# Patient Record
Sex: Female | Born: 1960
Health system: Southern US, Community
[De-identification: ages and names within clinical notes are randomized; demographics above are authoritative.]

## PROBLEM LIST (undated history)

## (undated) DIAGNOSIS — J45909 Unspecified asthma, uncomplicated: Secondary | ICD-10-CM

## (undated) DIAGNOSIS — N7011 Chronic salpingitis: Secondary | ICD-10-CM

## (undated) DIAGNOSIS — D259 Leiomyoma of uterus, unspecified: Secondary | ICD-10-CM

## (undated) DIAGNOSIS — F419 Anxiety disorder, unspecified: Secondary | ICD-10-CM

## (undated) DIAGNOSIS — K219 Gastro-esophageal reflux disease without esophagitis: Secondary | ICD-10-CM

## (undated) DIAGNOSIS — J302 Other seasonal allergic rhinitis: Secondary | ICD-10-CM

## (undated) DIAGNOSIS — N879 Dysplasia of cervix uteri, unspecified: Secondary | ICD-10-CM

## (undated) DIAGNOSIS — K635 Polyp of colon: Secondary | ICD-10-CM

## (undated) DIAGNOSIS — N870 Mild cervical dysplasia: Secondary | ICD-10-CM

## (undated) DIAGNOSIS — K589 Irritable bowel syndrome without diarrhea: Secondary | ICD-10-CM

## (undated) DIAGNOSIS — R208 Other disturbances of skin sensation: Secondary | ICD-10-CM

## (undated) DIAGNOSIS — N840 Polyp of corpus uteri: Secondary | ICD-10-CM

## (undated) DIAGNOSIS — J189 Pneumonia, unspecified organism: Secondary | ICD-10-CM

## (undated) DIAGNOSIS — I10 Essential (primary) hypertension: Secondary | ICD-10-CM

## (undated) DIAGNOSIS — E785 Hyperlipidemia, unspecified: Secondary | ICD-10-CM

## (undated) HISTORY — DX: Leiomyoma of uterus, unspecified: D25.9

## (undated) HISTORY — DX: Essential (primary) hypertension: I10

## (undated) HISTORY — PX: UPPER GASTROINTESTINAL ENDOSCOPY: SHX188

## (undated) HISTORY — DX: Polyp of colon: K63.5

## (undated) HISTORY — PX: PELVIC LAPAROSCOPY: SHX162

## (undated) HISTORY — DX: Mild cervical dysplasia: N87.0

## (undated) HISTORY — DX: Other seasonal allergic rhinitis: J30.2

## (undated) HISTORY — PX: OTHER SURGICAL HISTORY: SHX169

## (undated) HISTORY — PX: DILATION AND CURETTAGE OF UTERUS: SHX78

## (undated) HISTORY — PX: HYSTEROSCOPY: SHX211

## (undated) HISTORY — PX: LASER ABLATION OF THE CERVIX: SHX1949

## (undated) HISTORY — PX: CARPAL TUNNEL RELEASE: SHX101

## (undated) HISTORY — DX: Dysplasia of cervix uteri, unspecified: N87.9

## (undated) HISTORY — DX: Polyp of corpus uteri: N84.0

## (undated) HISTORY — DX: Anxiety disorder, unspecified: F41.9

## (undated) HISTORY — DX: Gastro-esophageal reflux disease without esophagitis: K21.9

## (undated) HISTORY — DX: Pneumonia, unspecified organism: J18.9

## (undated) HISTORY — DX: Other disturbances of skin sensation: R20.8

## (undated) HISTORY — DX: Unspecified asthma, uncomplicated: J45.909

## (undated) HISTORY — DX: Irritable bowel syndrome, unspecified: K58.9

## (undated) HISTORY — DX: Chronic salpingitis: N70.11

## (undated) HISTORY — PX: UTERINE FIBROID EMBOLIZATION: SHX825

## (undated) HISTORY — DX: Hyperlipidemia, unspecified: E78.5

---

## 1997-11-30 ENCOUNTER — Ambulatory Visit (HOSPITAL_COMMUNITY): Admission: RE | Admit: 1997-11-30 | Discharge: 1997-11-30 | Payer: Self-pay | Admitting: *Deleted

## 2000-04-12 ENCOUNTER — Encounter: Payer: Self-pay | Admitting: *Deleted

## 2000-04-12 ENCOUNTER — Ambulatory Visit (HOSPITAL_COMMUNITY): Admission: RE | Admit: 2000-04-12 | Discharge: 2000-04-12 | Payer: Self-pay | Admitting: *Deleted

## 2000-11-29 ENCOUNTER — Encounter: Payer: Self-pay | Admitting: *Deleted

## 2000-11-29 ENCOUNTER — Ambulatory Visit (HOSPITAL_COMMUNITY): Admission: RE | Admit: 2000-11-29 | Discharge: 2000-11-29 | Payer: Self-pay | Admitting: *Deleted

## 2001-01-05 ENCOUNTER — Ambulatory Visit (HOSPITAL_COMMUNITY): Admission: RE | Admit: 2001-01-05 | Discharge: 2001-01-05 | Payer: Self-pay | Admitting: *Deleted

## 2001-01-28 ENCOUNTER — Other Ambulatory Visit: Admission: RE | Admit: 2001-01-28 | Discharge: 2001-01-28 | Payer: Self-pay | Admitting: Obstetrics and Gynecology

## 2002-03-09 ENCOUNTER — Other Ambulatory Visit: Admission: RE | Admit: 2002-03-09 | Discharge: 2002-03-09 | Payer: Self-pay | Admitting: Obstetrics and Gynecology

## 2003-03-14 ENCOUNTER — Ambulatory Visit (HOSPITAL_COMMUNITY): Admission: RE | Admit: 2003-03-14 | Discharge: 2003-03-14 | Payer: Self-pay | Admitting: *Deleted

## 2003-06-27 ENCOUNTER — Other Ambulatory Visit: Admission: RE | Admit: 2003-06-27 | Discharge: 2003-06-27 | Payer: Self-pay | Admitting: Obstetrics and Gynecology

## 2003-08-14 ENCOUNTER — Ambulatory Visit (HOSPITAL_BASED_OUTPATIENT_CLINIC_OR_DEPARTMENT_OTHER): Admission: RE | Admit: 2003-08-14 | Discharge: 2003-08-14 | Payer: Self-pay | Admitting: Obstetrics and Gynecology

## 2004-07-31 ENCOUNTER — Other Ambulatory Visit: Admission: RE | Admit: 2004-07-31 | Discharge: 2004-07-31 | Payer: Self-pay | Admitting: Addiction Medicine

## 2005-02-17 ENCOUNTER — Other Ambulatory Visit: Admission: RE | Admit: 2005-02-17 | Discharge: 2005-02-17 | Payer: Self-pay | Admitting: Obstetrics and Gynecology

## 2005-02-24 ENCOUNTER — Ambulatory Visit: Payer: Self-pay | Admitting: Gastroenterology

## 2005-03-04 ENCOUNTER — Ambulatory Visit (HOSPITAL_COMMUNITY): Admission: RE | Admit: 2005-03-04 | Discharge: 2005-03-04 | Payer: Self-pay | Admitting: Obstetrics and Gynecology

## 2005-07-06 ENCOUNTER — Ambulatory Visit: Payer: Self-pay | Admitting: Gastroenterology

## 2005-09-17 ENCOUNTER — Other Ambulatory Visit: Admission: RE | Admit: 2005-09-17 | Discharge: 2005-09-17 | Payer: Self-pay | Admitting: Obstetrics and Gynecology

## 2006-11-29 ENCOUNTER — Encounter: Admission: RE | Admit: 2006-11-29 | Discharge: 2006-11-29 | Payer: Self-pay | Admitting: Internal Medicine

## 2007-02-01 ENCOUNTER — Other Ambulatory Visit: Admission: RE | Admit: 2007-02-01 | Discharge: 2007-02-01 | Payer: Self-pay | Admitting: Obstetrics and Gynecology

## 2007-06-01 ENCOUNTER — Encounter: Admission: RE | Admit: 2007-06-01 | Discharge: 2007-06-01 | Payer: Self-pay | Admitting: Internal Medicine

## 2007-08-12 ENCOUNTER — Ambulatory Visit: Payer: Self-pay | Admitting: Gastroenterology

## 2007-08-12 LAB — CONVERTED CEMR LAB
ALT: 20 units/L (ref 0–35)
AST: 24 units/L (ref 0–37)
Albumin: 3.8 g/dL (ref 3.5–5.2)
Alkaline Phosphatase: 67 units/L (ref 39–117)
BUN: 9 mg/dL (ref 6–23)
Basophils Absolute: 0.1 10*3/uL (ref 0.0–0.1)
Basophils Relative: 0.6 % (ref 0.0–1.0)
Bilirubin, Direct: 0.1 mg/dL (ref 0.0–0.3)
CO2: 29 meq/L (ref 19–32)
Calcium: 9 mg/dL (ref 8.4–10.5)
Chloride: 105 meq/L (ref 96–112)
Creatinine, Ser: 0.6 mg/dL (ref 0.4–1.2)
Eosinophils Absolute: 0.1 10*3/uL (ref 0.0–0.7)
Eosinophils Relative: 1.3 % (ref 0.0–5.0)
GFR calc Af Amer: 138 mL/min
GFR calc non Af Amer: 114 mL/min
Glucose, Bld: 99 mg/dL (ref 70–99)
HCT: 37.7 % (ref 36.0–46.0)
Hemoglobin: 12.2 g/dL (ref 12.0–15.0)
Lymphocytes Relative: 33.6 % (ref 12.0–46.0)
MCHC: 32.4 g/dL (ref 30.0–36.0)
MCV: 83.5 fL (ref 78.0–100.0)
Monocytes Absolute: 0.8 10*3/uL (ref 0.1–1.0)
Monocytes Relative: 8.8 % (ref 3.0–12.0)
Neutro Abs: 5.3 10*3/uL (ref 1.4–7.7)
Neutrophils Relative %: 55.7 % (ref 43.0–77.0)
Platelets: 316 10*3/uL (ref 150–400)
Potassium: 4 meq/L (ref 3.5–5.1)
RBC: 4.52 M/uL (ref 3.87–5.11)
RDW: 13.4 % (ref 11.5–14.6)
Sodium: 139 meq/L (ref 135–145)
TSH: 0.91 microintl units/mL (ref 0.35–5.50)
Total Bilirubin: 0.5 mg/dL (ref 0.3–1.2)
Total Protein: 7.3 g/dL (ref 6.0–8.3)
WBC: 9.5 10*3/uL (ref 4.5–10.5)

## 2007-08-29 ENCOUNTER — Ambulatory Visit: Payer: Self-pay | Admitting: Gastroenterology

## 2007-08-29 LAB — HM COLONOSCOPY

## 2007-11-21 ENCOUNTER — Encounter: Payer: Self-pay | Admitting: Obstetrics and Gynecology

## 2007-11-21 ENCOUNTER — Ambulatory Visit (HOSPITAL_BASED_OUTPATIENT_CLINIC_OR_DEPARTMENT_OTHER): Admission: RE | Admit: 2007-11-21 | Discharge: 2007-11-21 | Payer: Self-pay | Admitting: Obstetrics and Gynecology

## 2008-01-02 ENCOUNTER — Emergency Department (HOSPITAL_COMMUNITY): Admission: EM | Admit: 2008-01-02 | Discharge: 2008-01-02 | Payer: Self-pay | Admitting: Emergency Medicine

## 2008-01-26 ENCOUNTER — Ambulatory Visit: Payer: Self-pay | Admitting: Internal Medicine

## 2008-01-26 DIAGNOSIS — D259 Leiomyoma of uterus, unspecified: Secondary | ICD-10-CM | POA: Insufficient documentation

## 2008-01-26 DIAGNOSIS — K219 Gastro-esophageal reflux disease without esophagitis: Secondary | ICD-10-CM | POA: Insufficient documentation

## 2008-12-06 ENCOUNTER — Telehealth: Payer: Self-pay | Admitting: Gastroenterology

## 2008-12-10 ENCOUNTER — Telehealth: Payer: Self-pay | Admitting: Gastroenterology

## 2008-12-14 ENCOUNTER — Ambulatory Visit: Payer: Self-pay | Admitting: Internal Medicine

## 2008-12-17 ENCOUNTER — Telehealth (INDEPENDENT_AMBULATORY_CARE_PROVIDER_SITE_OTHER): Payer: Self-pay | Admitting: *Deleted

## 2008-12-25 ENCOUNTER — Other Ambulatory Visit: Admission: RE | Admit: 2008-12-25 | Discharge: 2008-12-25 | Payer: Self-pay | Admitting: Obstetrics and Gynecology

## 2008-12-25 ENCOUNTER — Encounter: Payer: Self-pay | Admitting: Obstetrics and Gynecology

## 2008-12-25 ENCOUNTER — Ambulatory Visit: Payer: Self-pay | Admitting: Obstetrics and Gynecology

## 2009-01-10 ENCOUNTER — Encounter: Payer: Self-pay | Admitting: Internal Medicine

## 2009-02-06 ENCOUNTER — Encounter: Payer: Self-pay | Admitting: Internal Medicine

## 2009-05-20 ENCOUNTER — Ambulatory Visit: Payer: Self-pay | Admitting: Obstetrics and Gynecology

## 2009-05-27 ENCOUNTER — Ambulatory Visit (HOSPITAL_COMMUNITY): Admission: RE | Admit: 2009-05-27 | Discharge: 2009-05-27 | Payer: Self-pay | Admitting: Obstetrics and Gynecology

## 2009-06-19 ENCOUNTER — Ambulatory Visit: Payer: Self-pay | Admitting: Internal Medicine

## 2009-07-17 ENCOUNTER — Ambulatory Visit: Payer: Self-pay | Admitting: Obstetrics and Gynecology

## 2009-09-24 LAB — CONVERTED CEMR LAB: Pap Smear: NORMAL

## 2009-10-02 LAB — HM MAMMOGRAPHY: HM Mammogram: NORMAL

## 2009-10-11 ENCOUNTER — Ambulatory Visit: Payer: Self-pay | Admitting: Internal Medicine

## 2009-10-11 DIAGNOSIS — M79609 Pain in unspecified limb: Secondary | ICD-10-CM | POA: Insufficient documentation

## 2009-10-11 DIAGNOSIS — B351 Tinea unguium: Secondary | ICD-10-CM | POA: Insufficient documentation

## 2009-10-15 ENCOUNTER — Ambulatory Visit: Payer: Self-pay | Admitting: Obstetrics and Gynecology

## 2009-10-17 ENCOUNTER — Ambulatory Visit: Payer: Self-pay | Admitting: Obstetrics and Gynecology

## 2009-10-23 ENCOUNTER — Ambulatory Visit: Payer: Self-pay | Admitting: Internal Medicine

## 2009-10-23 DIAGNOSIS — R21 Rash and other nonspecific skin eruption: Secondary | ICD-10-CM | POA: Insufficient documentation

## 2009-12-19 ENCOUNTER — Ambulatory Visit: Payer: Self-pay | Admitting: Internal Medicine

## 2009-12-19 DIAGNOSIS — B029 Zoster without complications: Secondary | ICD-10-CM | POA: Insufficient documentation

## 2009-12-19 LAB — CONVERTED CEMR LAB
ALT: 21 units/L (ref 0–35)
AST: 18 units/L (ref 0–37)
Albumin: 4 g/dL (ref 3.5–5.2)
Alkaline Phosphatase: 89 units/L (ref 39–117)
BUN: 9 mg/dL (ref 6–23)
Basophils Absolute: 0 10*3/uL (ref 0.0–0.1)
Basophils Relative: 0.5 % (ref 0.0–3.0)
Bilirubin, Direct: 0.1 mg/dL (ref 0.0–0.3)
CO2: 27 meq/L (ref 19–32)
Calcium: 9.3 mg/dL (ref 8.4–10.5)
Chloride: 107 meq/L (ref 96–112)
Creatinine, Ser: 0.5 mg/dL (ref 0.4–1.2)
Eosinophils Absolute: 0 10*3/uL (ref 0.0–0.7)
Eosinophils Relative: 0.6 % (ref 0.0–5.0)
GFR calc non Af Amer: 153.85 mL/min (ref >60)
Glucose, Bld: 90 mg/dL (ref 70–99)
HCT: 36.7 % (ref 36.0–46.0)
Hemoglobin: 12.5 g/dL (ref 12.0–15.0)
Lymphocytes Relative: 32.5 % (ref 12.0–46.0)
Lymphs Abs: 2.2 10*3/uL (ref 0.7–4.0)
MCHC: 33.9 g/dL (ref 30.0–36.0)
MCV: 83.5 fL (ref 78.0–100.0)
Monocytes Absolute: 0.6 10*3/uL (ref 0.1–1.0)
Monocytes Relative: 8.3 % (ref 3.0–12.0)
Neutro Abs: 3.9 10*3/uL (ref 1.4–7.7)
Neutrophils Relative %: 58.1 % (ref 43.0–77.0)
Platelets: 333 10*3/uL (ref 150.0–400.0)
Potassium: 4.3 meq/L (ref 3.5–5.1)
RBC: 4.39 M/uL (ref 3.87–5.11)
RDW: 14.6 % (ref 11.5–14.6)
Sed Rate: 35 mm/hr — ABNORMAL HIGH (ref 0–22)
Sodium: 143 meq/L (ref 135–145)
Total Bilirubin: 0.4 mg/dL (ref 0.3–1.2)
Total Protein: 7.3 g/dL (ref 6.0–8.3)
WBC: 6.8 10*3/uL (ref 4.5–10.5)

## 2009-12-20 ENCOUNTER — Telehealth: Payer: Self-pay | Admitting: Internal Medicine

## 2010-03-26 ENCOUNTER — Encounter: Payer: Self-pay | Admitting: Internal Medicine

## 2010-03-26 ENCOUNTER — Ambulatory Visit: Payer: Self-pay | Admitting: Internal Medicine

## 2010-03-26 DIAGNOSIS — R609 Edema, unspecified: Secondary | ICD-10-CM | POA: Insufficient documentation

## 2010-03-26 DIAGNOSIS — R9431 Abnormal electrocardiogram [ECG] [EKG]: Secondary | ICD-10-CM | POA: Insufficient documentation

## 2010-03-26 LAB — CONVERTED CEMR LAB
ALT: 22 units/L (ref 0–35)
AST: 27 units/L (ref 0–37)
Albumin: 4.1 g/dL (ref 3.5–5.2)
Alkaline Phosphatase: 83 units/L (ref 39–117)
BUN: 8 mg/dL (ref 6–23)
Basophils Absolute: 0.1 10*3/uL (ref 0.0–0.1)
Basophils Relative: 0.6 % (ref 0.0–3.0)
Bilirubin, Direct: 0 mg/dL (ref 0.0–0.3)
CO2: 25 meq/L (ref 19–32)
Calcium: 9.3 mg/dL (ref 8.4–10.5)
Chloride: 103 meq/L (ref 96–112)
Cholesterol: 198 mg/dL (ref 0–200)
Creatinine, Ser: 0.6 mg/dL (ref 0.4–1.2)
Eosinophils Absolute: 0 10*3/uL (ref 0.0–0.7)
Eosinophils Relative: 0.5 % (ref 0.0–5.0)
GFR calc non Af Amer: 133.52 mL/min (ref >60)
Glucose, Bld: 93 mg/dL (ref 70–99)
HCT: 37.8 % (ref 36.0–46.0)
HDL: 59.8 mg/dL (ref >39.00)
Hemoglobin: 12.7 g/dL (ref 12.0–15.0)
LDL Cholesterol: 127 mg/dL — ABNORMAL HIGH (ref 0–99)
Lymphocytes Relative: 36.1 % (ref 12.0–46.0)
Lymphs Abs: 3.2 10*3/uL (ref 0.7–4.0)
MCHC: 33.8 g/dL (ref 30.0–36.0)
MCV: 84.7 fL (ref 78.0–100.0)
Monocytes Absolute: 0.6 10*3/uL (ref 0.1–1.0)
Monocytes Relative: 7 % (ref 3.0–12.0)
Neutro Abs: 5 10*3/uL (ref 1.4–7.7)
Neutrophils Relative %: 55.8 % (ref 43.0–77.0)
Platelets: 297 10*3/uL (ref 150.0–400.0)
Potassium: 4.3 meq/L (ref 3.5–5.1)
Pro B Natriuretic peptide (BNP): 26.2 pg/mL (ref 0.0–100.0)
RBC: 4.46 M/uL (ref 3.87–5.11)
RDW: 14.6 % (ref 11.5–14.6)
Sodium: 139 meq/L (ref 135–145)
TSH: 0.87 microintl units/mL (ref 0.35–5.50)
Total Bilirubin: 0.5 mg/dL (ref 0.3–1.2)
Total CHOL/HDL Ratio: 3
Total Protein: 7.4 g/dL (ref 6.0–8.3)
Triglycerides: 56 mg/dL (ref 0.0–149.0)
VLDL: 11.2 mg/dL (ref 0.0–40.0)
WBC: 8.9 10*3/uL (ref 4.5–10.5)

## 2010-03-27 ENCOUNTER — Encounter: Payer: Self-pay | Admitting: Internal Medicine

## 2010-03-28 ENCOUNTER — Ambulatory Visit: Payer: Self-pay

## 2010-03-28 ENCOUNTER — Ambulatory Visit (HOSPITAL_COMMUNITY): Admission: RE | Admit: 2010-03-28 | Discharge: 2010-03-28 | Payer: Self-pay | Admitting: Internal Medicine

## 2010-03-28 ENCOUNTER — Ambulatory Visit: Payer: Self-pay | Admitting: Cardiology

## 2010-04-09 ENCOUNTER — Telehealth: Payer: Self-pay | Admitting: Internal Medicine

## 2010-04-17 ENCOUNTER — Ambulatory Visit: Payer: Self-pay | Admitting: Obstetrics and Gynecology

## 2010-04-17 ENCOUNTER — Other Ambulatory Visit
Admission: RE | Admit: 2010-04-17 | Discharge: 2010-04-17 | Payer: Self-pay | Source: Home / Self Care | Admitting: Obstetrics and Gynecology

## 2010-04-18 ENCOUNTER — Ambulatory Visit: Payer: Self-pay | Admitting: Internal Medicine

## 2010-05-20 ENCOUNTER — Ambulatory Visit
Admission: RE | Admit: 2010-05-20 | Discharge: 2010-05-20 | Payer: Self-pay | Source: Home / Self Care | Attending: Obstetrics and Gynecology | Admitting: Obstetrics and Gynecology

## 2010-06-10 NOTE — Progress Notes (Signed)
Summary: ?'s  Phone Note Call from Patient Call back at Home Phone [phone] Call back at Work Phone [phone] Call back at [phone] after 9 am   Summary of Call: Patient left message on triage that she would like to discuss her dx of shingles, she has a few questions.  Initial call taken by: Lucious Groves CMA,  December 20, 2009 8:35 AM  Follow-up for Phone Call        Spoke w/female at pt's home #. Pt left for beach. Advised her to have pt call office w/quesitons. Left vm on pt's cell # to call back.Marland KitchenMarland KitchenLamar Sprinkles, CMA  December 20, 2009 4:40 PM   Pt left vm:  1. How severe are her shingles? 2. Would like results of labs? Follow-up by: Lamar Sprinkles, CMA,  December 23, 2009 9:34 AM  Additional Follow-up for Phone Call Additional follow up Details #1::        1. mild 2. normal Additional Follow-up by: Etta Grandchild MD,  December 23, 2009 9:49 AM    Additional Follow-up for Phone Call Additional follow up Details #2::    Pt informed.  Follow-up by: Lamar Sprinkles, CMA,  December 23, 2009 4:12 PM

## 2010-06-10 NOTE — Assessment & Plan Note (Signed)
Summary: cold sx/cd   Vital Signs:  Patient profile:   50 year old female Height:      65 inches Weight:      211 pounds BMI:     35.24 O2 Sat:      98 % on Room air Temp:     98.1 degrees F oral Pulse rate:   88 / minute Pulse rhythm:   regular Resp:     16 per minute BP sitting:   140 / 74  (left arm) Cuff size:   large  Vitals Entered By: Rock Nephew CMA (June 19, 2009 1:10 PM)  Nutrition Counseling: Patient's BMI is greater than 25 and therefore counseled on weight management options.  O2 Flow:  Room air CC: cold, cough, congestion, URI symptoms Is Patient Diabetic? No Pain Assessment Patient in pain? no        Primary Care Provider:  Etta Grandchild MD  CC:  cold, cough, congestion, and URI symptoms.  History of Present Illness:  URI Symptoms      This is a 50 year old woman who presents with URI symptoms.  The symptoms began 5 days ago.  The severity is described as mild.  The patient reports nasal congestion, sore throat, and dry cough, but denies productive cough, earache, and sick contacts.  The patient denies fever, stiff neck, dyspnea, wheezing, rash, vomiting, diarrhea, use of an antipyretic, and response to antipyretic.  The patient denies headache, muscle aches, and severe fatigue.  The patient denies the following risk factors for Strep sinusitis: unilateral facial pain, unilateral nasal discharge, double sickening, tooth pain, tender adenopathy, and absence of cough.    Preventive Screening-Counseling & Management  Alcohol-Tobacco     Alcohol drinks/day: 0     Smoking Status: never     Passive Smoke Exposure: no  Hep-HIV-STD-Contraception     Hepatitis Risk: no risk noted     HIV Risk: no     STD Risk: no risk noted      Sexual History:  currently monogamous.        Drug Use:  never.        Blood Transfusions:  no.    Medications Prior to Update: 1)  Famotidine 40 Mg Tabs (Famotidine) .... One By Mouth Once Daily For  Heartburn  Allergies (verified): 1)  ! Jonne Ply  Past History:  Past Medical History: Reviewed history from 01/26/2008 and no changes required. GERD uterine fibroids  Past Surgical History: Reviewed history from 01/26/2008 and no changes required. uterine fibroid embolectomy one month ago  Family History: Reviewed history from 01/26/2008 and no changes required. Family History of Arthritis Family History Diabetes 1st degree relative Family History High cholesterol Family History Hypertension  Social History: Reviewed history from 01/26/2008 and no changes required. 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    Physical Exam  General:  alert, well-developed, well-nourished, well-hydrated, normal appearance, cooperative to examination, and overweight-appearing.   Ears:  R ear normal and L ear normal.   Nose:  no mucosal edema, no airflow obstruction, no intranasal foreign body, no nasal polyps, no nasal mucosal  lesions, no mucosal friability, no active bleeding or clots, and nasal dischargemucosal pallor.   Mouth:  Oral mucosa and oropharynx without lesions or exudates.  Teeth in good repair. Neck:  No deformities, masses, or tenderness noted. Lungs:  Normal respiratory effort, chest expands symmetrically. Lungs are clear to auscultation, no crackles or wheezes. Heart:  Normal rate and regular rhythm. S1 and S2 normal without gallop, murmur, click, rub or other extra sounds. Abdomen:  Bowel sounds positive,abdomen soft and non-tender without  masses, organomegaly or hernias noted. Msk:  No deformity or scoliosis noted of thoracic or lumbar spine.   Pulses:  R and L carotid,radial,femoral,dorsalis pedis and posterior tibial pulses are full and equal bilaterally Extremities:  No clubbing, cyanosis, edema, or deformity noted with normal full range of motion of all joints.   Skin:  Intact without suspicious lesions or rashes Cervical Nodes:  No lymphadenopathy noted Axillary Nodes:  No palpable lymphadenopathy Psych:  Cognition and judgment appear intact. Alert and cooperative with normal attention span and concentration. No apparent delusions, illusions, hallucinations   Impression & Recommendations:  Problem # 1:  BRONCHITIS-ACUTE (ICD-466.0) Assessment New  Her updated medication list for this problem includes:    Zithromax Tri-pak 500 Mg Tab (Azithromycin) .Marland Kitchen... Take one by mouth once daily for 3 days    Mytussin Ac 100-10 Mg/62ml Syrp (Guaifenesin-codeine) .Marland Kitchen... 5-10 ml by mouth qid as needed for cough  Take antibiotics and other medications as directed. Encouraged to push clear liquids, get enough rest, and take acetaminophen as needed. To be seen in 5-7 days if no improvement, sooner if worse.  Complete Medication List: 1)  Famotidine 40 Mg Tabs (Famotidine) .... One by mouth once daily for heartburn 2)  Zithromax Tri-pak 500 Mg Tab (Azithromycin) .... Take one by mouth once daily for 3 days 3)  Mytussin Ac 100-10 Mg/60ml Syrp (Guaifenesin-codeine) .... 5-10 ml by mouth qid as needed for cough  Patient Instructions: 1)  Please schedule a follow-up appointment in 2 weeks. 2)  Take your antibiotic as prescribed until ALL of it is gone, but stop if you develop a rash or swelling and contact our office as soon as possible. 3)  Acute bronchitis symptoms for less than 10 days are not helped by antibiotics. take over the counter cough medications. call if no improvment in  5-7 days, sooner if increasing cough, fever, or new  symptoms( shortness of breath, chest pain). Prescriptions: MYTUSSIN AC 100-10 MG/5ML SYRP (GUAIFENESIN-CODEINE) 5-10 ml by mouth QID as needed for cough  #6 ounces x 0   Entered and Authorized by:   Etta Grandchild MD   Signed by:   Etta Grandchild MD on 06/19/2009   Method used:   Print then Give to Patient   RxID:   [id number] ZITHROMAX TRI-PAK 500 MG TAB (AZITHROMYCIN) Take one by mouth once daily for 3 days  #3 x 0   Entered and Authorized by:   Etta Grandchild MD   Signed by:   Etta Grandchild MD on 06/19/2009   Method used:   Print then Give to Patient   RxID:   [id number]

## 2010-06-10 NOTE — Assessment & Plan Note (Signed)
Summary: NEW TO ESTABLISH/SCRATCHY THROAT,HEADACHE/$50/CD   Vital Signs:  Patient Profile:   50 Years Old Female LMP:     01/07/2008 Temp:     97.2 degrees F oral Pulse rate:   60 / minute Pulse rhythm:   regular BP sitting:   122 / 84  (left arm) Cuff size:   regular  Vitals Entered By: Rock Nephew CMA (January 26, 2008 10:51 AM)  Menstrual History: LMP (date): 01/07/2008 LMP - Character: heavy                 Chief Complaint:  headache, cough, bodyache, and exposed to flu.  History of Present Illness: this is a new patient complaining of a 2 day history of headache, nonproductive cough, left earache, but no fever or chills. She has had myalgias but no rash or lymphadenopathy. She is taking Tylenol cough and cold but says it's not controlling her symptoms.  Today she is asking for a prescription for Zantac. She is currently taking the over-the-counter dosage of 75 mg twice a day but she states it's getting expensive and she wants to increase to Zantac once a day. She states her as her reflux symptoms are well controlled  Acute Visit History:      The patient complains of cough, earache, and musculoskeletal symptoms.  She denies abdominal pain, chest pain, constipation, fever, nasal discharge, nausea, rash, sinus problems, sore throat, and vomiting.        The character of the cough is described as nonproductive.  She has no history of COPD.  There is no history of wheezing, sleep interference, shortness of breath, respiratory retractions, tachypnea, cyanosis, or interference with oral intake associated with her cough.        The patient has a prior history of GERD.  The patient has no history of peptic ulcer disease.        Her last menstrual period was 01/07/2008.         Dyspepsia History:      The patient notes that the symptoms began approximately 12/16/2006.  There is a prior history of GERD.  She notes that she has never had an upper endoscopy or GI consultation.   The patient does not have a prior history of documented ulcer disease.  The dominant symptom is heartburn or acid reflux.  An H-2 blocker medication is currently being taken.  She notes that the symptoms have improved with the H-2 blocker therapy.  Symptoms have persisted after 4 weeks of H-2 blocker treatment.  She has no history of a positive H. Pylori serology.  No previous upper endoscopy has been done.        Prior Medication List:  No prior medications documented  Updated Prior Medication List: zantac otc 75mg  bid Current Allergies: ! 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       Divorced       Never Smoked  Alcohol use-no       Drug use-no       Regular exercise-yes   Risk Factors:  Tobacco use:  never Passive smoke exposure:  no Drug use:  no HIV high-risk behavior:  no Alcohol use:  no Exercise:  yes  Family History Risk Factors:    Family History of MI in females < 41 years old:  no   Review of Systems  The patient denies anorexia, weight loss, hoarseness, chest pain, syncope, dyspnea on exertion, peripheral edema, prolonged cough, headaches, hemoptysis, abdominal pain, melena, hematochezia, severe indigestion/heartburn, hematuria, depression, unusual weight change, abnormal bleeding, and enlarged lymph nodes.     Physical Exam  General:     alert, well-developed, well-nourished, well-hydrated, appropriate dress, normal appearance, healthy-appearing, and cooperative to examination.   Ears:     External ear exam shows no  significant lesions or deformities.  Otoscopic examination reveals clear canals, tympanic membranes are intact bilaterally without bulging, retraction, inflammation or discharge. Hearing is grossly normal bilaterally. Nose:     External nasal examination shows no deformity or inflammation. Nasal mucosa are pink and moist without lesions or exudates. Mouth:     Oral mucosa and oropharynx without lesions or exudates.  Teeth in good repair. Neck:     No deformities, masses, or tenderness noted. Lungs:     Normal respiratory effort, chest expands symmetrically. Lungs are clear to auscultation, no crackles or wheezes. Heart:     Normal rate and regular rhythm. S1 and S2 normal without gallop, murmur, click, rub or other extra sounds. Abdomen:     soft, non-tender, normal bowel sounds, no hepatomegaly, and no splenomegaly.   Skin:     turgor normal, color normal, no rashes, and no edema.   Cervical Nodes:     no anterior cervical adenopathy and no posterior cervical adenopathy.   Psych:     Oriented X3, memory intact for recent and remote, normally interactive, good eye contact, not anxious appearing, not depressed appearing, and not agitated.      Impression & Recommendations:  Problem # 1:  GERD (ICD-530.81) Assessment: Improved increase Zantac to 300 mg once a day Her updated medication list for this problem includes:    Ranitidine Hcl 300 Mg Caps (Ranitidine hcl) ..... Once daily   Problem # 2:  FIBROIDS, UTERUS (ICD-218.9) Assessment: Improved  Problem # 3:  URI (ICD-465.9) Assessment: New infection appears to be in part viral in nature and at this time and only recommended symptomatic relief and observation  Complete Medication List: 1)  Ranitidine Hcl 300 Mg Caps (Ranitidine hcl) .... Once daily  Other Orders: Flu Vaccine 56yrs + (16109) Admin 1st Vaccine (60454)  Dyspepsia Assessment/Plan:  Step Therapy: GERD Treatment Protocols:    Step-1: started    H-2 blocker  chosen: Ranitidine 150mg  by mouth at bedtime   Patient Instructions: 1)  Please schedule a follow-up appointment in 1 month. 2)  Avoid foods high in acid (tomatoes, citrus juices, spicy foods). Avoid eating within two hours of lying down or before exercising. Do not over eat; try smaller more frequent meals. Elevate head of bed twelve inches when sleeping. 3)  It is important that you exercise regularly at least 20 minutes 5 times a week. If you develop chest pain, have severe difficulty breathing, or feel very tired , stop exercising immediately and seek medical attention. 4)  You need to lose weight. Consider a lower calorie diet and regular exercise.  5)  Get plenty of  rest, drink lots of clear liquids, and use Tylenol or Ibuprofen for fever and comfort. Return in 7-10 days if you're not better:sooner if you're feeling worse.   Prescriptions: RANITIDINE HCL 300 MG CAPS (RANITIDINE HCL) once daily  #30 x 9   Entered and Authorized by:   Etta Grandchild MD   Signed by:   Etta Grandchild MD on 01/26/2008   Method used:   Print then Give to Patient   RxID:   [id number]  ]  Influenza Vaccine    Vaccine Type: Fluvax 3+    Site: left deltoid    Mfr: GlaxoSmithKline    Dose: 0.5 ml    Route: IM    Given by: Rock Nephew CMA    Exp. Date: 11/07/2008    Lot #: [id number]    VIS given: 12/02/06 version given January 26, 2008.  Flu Vaccine Consent Questions    Do you have a history of severe allergic reactions to this vaccine? no    Any prior history of allergic reactions to egg and/or gelatin? no    Do you have a sensitivity to the preservative Thimersol? no    Do you have a past history of Guillan-Barre Syndrome? no    Do you currently have an acute febrile illness? no    Have you ever had a severe reaction to latex? no    Vaccine information given and explained to patient? yes    Are you currently pregnant? no   Appended Document: NEW TO ESTABLISH/SCRATCHY  THROAT,HEADACHE/$50/CD As billing provider, I have reviewed this document.

## 2010-06-10 NOTE — Procedures (Signed)
Summary: Colon   Colonoscopy  Procedure date:  08/29/2007  Findings:      Location:  [facility].   Patient Name: Betty Bell, Betty Bell MRN: [id number] Procedure Procedures: Colonoscopy CPT: 54098.  Personnel: Endoscopist: Rachael Fee, MD.  Exam Location: Exam performed in Endoscopy Suite. Outpatient  Patient Consent: Procedure, Alternatives, Risks and Benefits discussed, consent obtained, from patient. Consent was obtained by the RN. Consent to be contacted was given.  Indications Symptoms: Constipation Abdominal pain / bloating.  Increased Risk Screening: For family history of colorectal neoplasia, in  parent  Comments: father had colon cancer. History  Current Medications: Patient is not currently taking Coumadin.  Allergies: No known allergies.  Comments: Patient history reviewed and updated, pre-procedure physical performed prior to initiation of sedation? yes Pre-Exam Physical: Performed Aug 29, 2007. Cardio-pulmonary exam, Abdominal exam, Mental status exam WNL.  Exam Exam: Extent of exam reached: Cecum, extent intended: Cecum.  The cecum was identified by appendiceal orifice and IC valve. Patient position: on left side. Time to Cecum: 00:03. Time for Withdrawl: 00:07:52. Colon retroflexion performed. Images taken. ASA Classification: II. Tolerance: good.  Monitoring: Pulse and BP monitoring, Oximetry used. Supplemental O2 given.  Colon Prep Prep results: good.  Sedation Meds: Patient assessed and found to be appropriate for moderate (conscious) sedation. Fentanyl 100 mcg. given IV. Versed 10 mg. given IV.  Findings - NORMAL EXAM: Cecum to Rectum.   Assessment Normal examination.  Comments: NORMAL EXAMINATION; NO COLON POLYPS OR CANCERS.  GIVEN HER FAMILY HISTORY OF COLON CANCER (FATHER), SHE WILL NEED A REPEAT COLONOSCOPY IN 5 YEARS.  HER MILD CONSTIPATION AND ABDOMINAL PAINS HAVE IMPROVED SINCE SHE STARTED DIALY F[employer] SUPPLEMENTS.  SHE WILL CONTINUE THISE INDEFINITELY.  OF NOTE, RECENT CBC, CMET, TSH WERE ALL NORMAL. Events  Unplanned Interventions: No intervention was required.  Unplanned Events: There were no complications. Plans Comments: COLONOSCOPY IN 5 YEARS.   This report was created from the original endoscopy report, which was reviewed and signed by the above listed endoscopist.

## 2010-06-10 NOTE — Assessment & Plan Note (Signed)
Summary: KNOT ON A STIFF NECK--STC   Vital Signs:  Patient profile:   50 year old female Height:      65 inches Weight:      205 pounds BMI:     34.24 O2 Sat:      97 % on Room air Temp:     97.5 degrees F oral Pulse rate:   84 / minute Pulse rhythm:   regular Resp:     16 per minute BP sitting:   130 / 80  (left arm) Cuff size:   large  Vitals Entered By: Rock Nephew CMA (December 19, 2009 10:38 AM)  Nutrition Counseling: Patient's BMI is greater than 25 and therefore counseled on weight management options.  O2 Flow:  Room air CC: Patient c/o knot under  Is Patient Diabetic? No   Primary Care Provider:  Etta Grandchild MD  CC:  Patient c/o knot under .  History of Present Illness: She returns c/o a painful lymph node under her right ear and a painful red rash on the right side of her face for 2 days. The rash that she had previously has resolved.  Preventive Screening-Counseling & Management  Alcohol-Tobacco     Alcohol drinks/day: 0     Smoking Status: never     Passive Smoke Exposure: no  Hep-HIV-STD-Contraception     Hepatitis Risk: no risk noted     HIV Risk: no     STD Risk: no risk noted      Sexual History:  currently monogamous.        Drug Use:  never.        Blood Transfusions:  no.    Medications Prior to Update: 1)  Famotidine 40 Mg Tabs (Famotidine) .... One By Mouth Once Daily For Heartburn 2)  Penlac 8 % Soln (Ciclopirox) .... Use Once Daily On Affected Nail(S). Once A Week Remove The Build-Up With An Alcohol Swab  Current Medications (verified): 1)  Famotidine 40 Mg Tabs (Famotidine) .... One By Mouth Once Daily For Heartburn 2)  Penlac 8 % Soln (Ciclopirox) .... Use Once Daily On Affected Nail(S). Once A Week Remove The Build-Up With An Alcohol Swab 3)  Acyclovir 800 Mg Tabs (Acyclovir) .... One By Mouth Three Times A Day For 10 Days 4)  Percocet 7.5-500 Mg Tabs (Oxycodone-Acetaminophen) .... One By Mouth Qid As Needed For  Pain  Allergies (verified): 1)  ! 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 Family History of Arthritis Family History Diabetes 1st degree relative Family History High cholesterol Family History Hypertension  Social History: Reviewed history from 01/26/2008 and no changes required. 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 Derm:  Complains of itching, lesion(s), and rash; denies changes in color of skin,  dryness, flushing, hair loss, insect bite(s), and poor wound healing.  Physical Exam  General:  alert, well-developed, well-nourished, well-hydrated, appropriate dress, normal appearance, healthy-appearing, and overweight-appearing.   Head:  on her right malar surface there is a distinct erythematous rash with mild induration that measures 3.5 cm. The edges are very well demarcated and there is no streaking, pustules, fluctuance, exudate, vesicles, or skin breakdown. Eyes:  vision grossly intact, pupils equal, and no injection.   Ears:  R  ear normal and L ear normal.   Nose:  External nasal examination shows no deformity or inflammation. Nasal mucosa are pink and moist without lesions or exudates. Mouth:  Oral mucosa and oropharynx without lesions or exudates.  Teeth in good repair. Neck:  supple, full ROM, no masses, no thyromegaly, no JVD, normal carotid upstroke, no carotid bruits, and no cervical lymphadenopathy.   Lungs:  Normal respiratory effort, chest expands symmetrically. Lungs are clear to auscultation, no crackles or wheezes. Heart:  Normal rate and regular rhythm. S1 and S2 normal without gallop, murmur, click, rub or other extra sounds. Abdomen:  Bowel sounds positive,abdomen soft and non-tender without masses, organomegaly or hernias noted. Msk:  normal ROM, no joint tenderness, no joint swelling, no joint warmth, no redness over joints, no joint deformities, no joint instability, and no crepitation.   Pulses:  R and L carotid,radial,femoral,dorsalis pedis and posterior tibial pulses are full and equal bilaterally Extremities:  No clubbing, cyanosis, edema, or deformity noted with normal full range of motion of all joints.   Neurologic:  No cranial nerve deficits noted. Station and gait are normal. Plantar reflexes are down-going bilaterally. DTRs are symmetrical throughout. Sensory, motor and coordinative functions appear intact. Skin:  turgor normal, color normal, no rashes, no suspicious lesions, no ecchymoses, no petechiae, no purpura, no ulcerations, and no edema.   Cervical Nodes:  She has a palpable lymph node in the right infra-auricular area that measures about 1.5 cm. there is no other LAD. Axillary Nodes:  no R axillary adenopathy and no L axillary adenopathy.   Inguinal Nodes:  no R inguinal adenopathy and no L inguinal adenopathy.   Psych:  Cognition and judgment appear intact. Alert and cooperative with normal attention span and concentration. No apparent delusions, illusions, hallucinations   Impression  & Recommendations:  Problem # 1:  HERPES ZOSTER, SCALP, RIGHT (ICD-053.9) Assessment New start Acyclovir, take percocet for pain  Problem # 2:  SKIN RASH (ICD-782.1) Assessment: Improved  Her updated medication list for this problem includes:    Penlac 8 % Soln (Ciclopirox) ..... Use once daily on affected nail(s). once a week remove the build-up with an alcohol swab  Orders: Venipuncture (13086) TLB-BMP (Basic Metabolic Panel-BMET) (80048-METABOL) TLB-Hepatic/Liver Function Pnl (80076-HEPATIC) TLB-CBC Platelet - w/Differential (85025-CBCD) TLB-Sedimentation Rate (ESR) (85652-ESR)  Problem # 3:  ONYCHOMYCOSIS (ICD-110.1) Assessment: Unchanged  Her updated medication list for this problem includes:    Penlac 8 % Soln (Ciclopirox) ..... Use once daily on affected nail(s). once a week remove the build-up with an alcohol swab  Orders: Venipuncture (57846) TLB-BMP (Basic Metabolic Panel-BMET) (80048-METABOL) TLB-Hepatic/Liver Function Pnl (80076-HEPATIC) TLB-CBC Platelet - w/Differential (85025-CBCD) TLB-Sedimentation Rate (ESR) (85652-ESR)  Discussed nail care and medication treatment options.   Complete Medication List: 1)  Famotidine 40 Mg Tabs (Famotidine) .... One by mouth once daily for heartburn 2)  Penlac 8 % Soln (Ciclopirox) .... Use once daily on affected nail(s). once a week remove the build-up with an alcohol swab 3)  Acyclovir 800  Mg Tabs (Acyclovir) .... One by mouth three times a day for 10 days 4)  Percocet 7.5-500 Mg Tabs (Oxycodone-acetaminophen) .... One by mouth qid as needed for pain  Patient Instructions: 1)  Please schedule a follow-up appointment in 2 weeks. 2)  Take your antibiotic as prescribed until ALL of it is gone, but stop if you develop a rash or swelling and contact our office as soon as possible. Prescriptions: PERCOCET 7.5-500 MG TABS (OXYCODONE-ACETAMINOPHEN) One by mouth QID as needed for pain  #35 x 0   Entered and Authorized by:   Etta Grandchild MD   Signed by:   Etta Grandchild MD on 12/19/2009   Method used:   Print then Give to Patient   RxID:   [id number] ACYCLOVIR 800 MG TABS (ACYCLOVIR) One by mouth three times a day for 10 days  #30 x 1   Entered and Authorized by:   Etta Grandchild MD   Signed by:   Etta Grandchild MD on 12/19/2009   Method used:   Electronically to        [facility] [phone]* (retail)       [address]       [place], [place]  [address]       Ph: [phone] or [phone]       Fax: [phone]   RxID:   [id number]

## 2010-06-10 NOTE — Procedures (Signed)
Summary: EGDwAntral bxs/[facility]  EGDwAntral bxs/[facility]   Imported By: Lester Froid 02/13/2009 10:36:35  _____________________________________________________________________  External Attachment:    Type:   Image     Comment:   External Document

## 2010-06-10 NOTE — Letter (Signed)
Summary: Lipid Letter  [facility]  [address] [place], [place] [address]   Phone: [phone]  Fax: [phone]    03/27/2010  Betty Bell [address] [place], [place]  [address]  Dear Betty Bell:  We have carefully reviewed your last lipid profile from 03/26/2010 and the results are noted below with a summary of recommendations for lipid management.    Cholesterol:       198     Goal: <200   HDL "good" Cholesterol:   75.64     Goal: >50   LDL "bad" Cholesterol:   127     Goal: <130   Triglycerides:       56.0     Goal: <150    other labs look great    TLC Diet (Therapeutic Lifestyle Change): Saturated Fats & Transfatty acids should be kept < 7% of total calories ***Reduce Saturated Fats Polyunstaurated Fat can be up to 10% of total calories Monounsaturated Fat Fat can be up to 20% of total calories Total Fat should be no greater than 25-35% of total calories Carbohydrates should be 50-60% of total calories Protein should be approximately 15% of total calories Fiber should be at least 20-30 grams a day ***Increased fiber may help lower LDL Total Cholesterol should be < 200mg /day Consider adding plant stanol/sterols to diet (example: Benacol spread) ***A higher intake of unsaturated fat may reduce Triglycerides and Increase HDL    Adjunctive Measures (may lower LIPIDS and reduce risk of Heart Attack) include: Aerobic Exercise (20-30 minutes 3-4 times a week) Limit Alcohol Consumption Weight Reduction Aspirin 75-81 mg a day by mouth (if not allergic or contraindicated) Dietary Fiber 20-30 grams a day by mouth     Current Medications: 1)    Zantac 150 Mg Tabs (Ranitidine hcl) 2)    Penlac 8 % Soln (Ciclopirox) .... Use once daily on affected nail(s). once a week remove the build-up with an alcohol swab 3)    Percocet 7.5-500 Mg Tabs (Oxycodone-acetaminophen) .... One by mouth qid as needed for pain  If you have any questions, please call. We  appreciate being able to work with you.   Sincerely,    [facility] Etta Grandchild MD

## 2010-06-10 NOTE — Assessment & Plan Note (Signed)
Summary: SWELLING,REDNESS,BURNING/MOSQUITO BITE-DR TLJ PT-OYU   Vital Signs:  Patient profile:   50 year old female Height:      65 inches Weight:      210.75 pounds BMI:     35.20 O2 Sat:      98 % on Room air Temp:     97.3 degrees F oral Pulse rate:   76 / minute BP sitting:   146 / 92  (left arm) Cuff size:   regular  Vitals Entered By: Lucious Groves (October 11, 2009 3:31 PM)  O2 Flow:  Room air  Procedure Note  Incision & Drainage: The patient complains of pain and inflammation. Onset of lesion: 4 days Indication: infected lesion  Procedure # 1: I & D    Size (in cm): 1.0 x 0.9    Region: posterior/lat    Location: L prox shin    Comment: Risks including but not limited by incomplete procedure, bleeding, infection, recurrence were discussed with the patient. Consent form was signed.  Skin prepped. Boil necrotic skin excised. Pus drained, wound cleaned and dressed. Tolerated well. Complicatons - none.     Instrument used: #10 blade    Anesthesia: none  Cleaned and prepped with: alcohol and betadine Wound dressing: neosporin and bandaid Instructions: daily dressing changes  CC: C/O mosquito bite that resembles an abscess--red, swollen, and hot to the touch./kb Is Patient Diabetic? No Pain Assessment Patient in pain? no        Primary Care Provider:  Etta Grandchild MD  CC:  C/O mosquito bite that resembles an abscess--red, swollen, and and hot to the touch./kb.  History of Present Illness: C/o L shin lesion x 2 d - painful. Does not recall a bite, injury etc... C/o R big toenail is discolored x 12 months   Current Medications (verified): 1)  Famotidine 40 Mg Tabs (Famotidine) .... One By Mouth Once Daily For Heartburn 2)  Mytussin Ac 100-10 Mg/26ml Syrp (Guaifenesin-Codeine) .... 5-10 Ml By Mouth Qid As Needed For Cough  Allergies (verified): 1)  ! 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   Mouth:  Oral mucosa and oropharynx without lesions or exudates.  Teeth in good repair. Lungs:  Normal respiratory effort, chest expands symmetrically. Lungs are clear to auscultation, no crackles or wheezes. Heart:  Normal rate and regular rhythm. S1 and S2 normal without gallop, murmur, click, rub or other extra sounds. Msk:  No deformity or scoliosis noted of thoracic or lumbar spine.   Skin:  L posterolat shin abscess 1 cm with a 12x9 cm of erythema w/sharp borders, painful R big toe toenail is w/onycho Inguinal Nodes:  No significant adenopathy   Impression & Recommendations:  Problem # 1:  ABSCESS, LEG (ICD-682.6) L/ cellulitis Assessment New  Her updated medication list for this problem includes:    Doxycycline Hyclate 100 Mg Caps (Doxycycline hyclate) .Marland Kitchen... 1 by mouth two times a day with a glass of water  Orders: I&D Abscess, Simple / Single (10060) T-Culture, Wound (87070/87205-70190)  Problem # 2:  LEG PAIN (ICD-729.5) due to #1 Assessment: New  Problem # 3:  ONYCHOMYCOSIS (ICD-110.1) R big toe Assessment: New  Her updated medication list for this problem includes:  Penlac 8 % Soln (Ciclopirox) ..... Use once daily on affected nail(s). once a week remove the build-up with an alcohol swab  Complete Medication List: 1)  Famotidine 40 Mg Tabs (Famotidine) .... One by mouth once daily for heartburn 2)  Mytussin Ac 100-10 Mg/11ml Syrp (Guaifenesin-codeine) .... 5-10 ml by mouth qid as needed for cough 3)  Doxycycline Hyclate 100 Mg Caps (Doxycycline hyclate) .Marland Kitchen.. 1 by mouth two times a day with a glass of water 4)  Mupirocin 2 % Oint (Mupirocin) .... Use two  times a day w/dressing change 5)  Penlac 8 % Soln (Ciclopirox) .... Use once daily on affected nail(s). once a week remove the build-up with an alcohol swab  Patient Instructions: 1)  Call if you are not better in a reasonable amount of time or if worse.  Prescriptions: PENLAC 8 % SOLN (CICLOPIROX) Use once daily on affected nail(s). Once a week remove the build-up with an alcohol swab  #1 x 2   Entered and Authorized by:   Tresa Garter MD   Signed by:   Tresa Garter MD on 10/11/2009   Method used:   Print then Give to Patient   RxID:   [id number] MUPIROCIN 2 % OINT (MUPIROCIN) use two times a day w/dressing change  #30 g x 0   Entered and Authorized by:   Tresa Garter MD   Signed by:   Tresa Garter MD on 10/11/2009   Method used:   Print then Give to Patient   RxID:   [id number] DOXYCYCLINE HYCLATE 100 MG CAPS (DOXYCYCLINE HYCLATE) 1 by mouth two times a day with a glass of water  #20 x 0   Entered and Authorized by:   Tresa Garter MD   Signed by:   Tresa Garter MD on 10/11/2009   Method used:   Print then Give to Patient   RxID:   [id number] PENLAC 8 % SOLN (CICLOPIROX) Use once daily on affected nail(s). Once a week remove the build-up with an alcohol swab  #1 x 2   Entered and Authorized by:   Tresa Garter MD   Signed by:   Tresa Garter MD on 10/11/2009   Method used:   [facility]        [facility] [phone]* (retail)       [address], [place]  [address]       Ph: [phone] or [phone]       Fax: [phone]   RxID:   [id number] MUPIROCIN 2 % OINT (MUPIROCIN) use two times a day w/dressing change  #30 g x 0   Entered and Authorized by:   Tresa Garter MD   Signed by:   Tresa Garter MD on 10/11/2009   Method used:   [facility] to        [facility] [phone]* (retail)       [address], [place]  [address]       Ph: [phone] or  [phone]       Fax: [phone]   RxID:   [id number] DOXYCYCLINE HYCLATE 100 MG CAPS (DOXYCYCLINE HYCLATE) 1 by mouth two times a day with a glass of water  #20 x 0   Entered and Authorized by:   Tresa Garter MD   Signed by:   Tresa Garter MD on  10/11/2009   Method used:   [facility]        [facility] [phone]* (retail)       [address]       [place], [place]  [address]       Ph: [phone] or [phone]       Fax: [phone]   RxID:   [id number]

## 2010-06-10 NOTE — Progress Notes (Signed)
Summary: RESULTS  Phone Note Call from Patient Call back at Home Phone [phone]   Caller: Patient Call For: Etta Grandchild MD Summary of Call: PT requests results of Echo done 11/18. Please advise. Initial call taken by: Verdell Face,  April 09, 2010 9:38 AM  Follow-up for Phone Call        normal Follow-up by: Etta Grandchild MD,  April 09, 2010 12:42 PM  Additional Follow-up for Phone Call Additional follow up Details #1::        pt states she still has the swelling in her legs and she wants to know what the next step will be Additional Follow-up by: Ami Bullins CMA,  April 09, 2010 3:37 PM    Additional Follow-up for Phone Call Additional follow up Details #2::    a f/up visit with me Follow-up by: Etta Grandchild MD,  April 09, 2010 3:57 PM  Additional Follow-up for Phone Call Additional follow up Details #3:: Details for Additional Follow-up Action Taken: Pt informed, transferred to scheduler for office visit.  Additional Follow-up by: Lamar Sprinkles, CMA,  April 09, 2010 4:17 PM

## 2010-06-10 NOTE — Assessment & Plan Note (Signed)
Summary: cpx-lb   Vital Signs:  Patient profile:   50 year old female Menstrual status:  postmenopausal Height:      65 inches Weight:      211.50 pounds BMI:     35.32 O2 Sat:      96 % on Room air Temp:     98.0 degrees F oral Pulse rate:   80 / minute Pulse rhythm:   regular Resp:     16 per minute BP sitting:   120 / 82  (left arm) Cuff size:   large  Vitals Entered By: Rock Nephew CMA (March 26, 2010 4:06 PM)  Nutrition Counseling: Patient's BMI is greater than 25 and therefore counseled on weight management options.  O2 Flow:  Room air  Primary Care Provider:  Etta Grandchild MD   History of Present Illness: She returns and requests a complete physical but she also complains of worsening leg and ankle edema for the last 1-2 weeks.  Dyspepsia History:      She has no alarm features of dyspepsia including no history of melena, hematochezia, dysphagia, persistent vomiting, or involuntary weight loss > 5%.  There is a prior history of GERD.  The patient does not have a prior history of documented ulcer disease.  The dominant symptom is heartburn or acid reflux.  An H-2 blocker medication is currently being taken.  She notes that the symptoms have improved with the H-2 blocker therapy.  Symptoms have not persisted after 4 weeks of H-2 blocker treatment.     Preventive Screening-Counseling & Management  Alcohol-Tobacco     Alcohol drinks/day: 0     Alcohol Counseling: not indicated; patient does not drink     Smoking Status: never     Passive Smoke Exposure: no     Tobacco Counseling: not indicated; no tobacco use  Hep-HIV-STD-Contraception     Hepatitis Risk: no risk noted     HIV Risk: no     STD Risk: no risk noted     Dental Visit-last 6 months yes     Dental Care Counseling: to seek dental care; no dental care within six months     SBE monthly: no     Sun Exposure-Excessive: no  Safety-Violence-Falls     Seat Belt Use: yes     Helmet Use: n/a  Firearms in the Home: no firearms in the home     Smoke Detectors: yes     Violence in the Home: no risk noted      Sexual History:  currently monogamous.        Drug Use:  never.        Blood Transfusions:  no.    Clinical Review Panels:  Prevention   Last Mammogram:  Normal Bilateral (10/02/2009)   Last Pap Smear:  Normal (09/24/2009)   Last Colonoscopy:  Location:  [facility].  (08/29/2007)  Immunizations   Last Flu Vaccine:  Fluvax 3+ (01/26/2008)  Diabetes Management   Creatinine:  0.5 (12/19/2009)   Last Flu Vaccine:  Fluvax 3+ (01/26/2008)  CBC   WBC:  6.8 (12/19/2009)   RBC:  4.39 (12/19/2009)   Hgb:  12.5 (12/19/2009)   Hct:  36.7 (12/19/2009)   Platelets:  333.0 (12/19/2009)   MCV  83.5 (12/19/2009)   MCHC  33.9 (12/19/2009)   RDW  14.6 (12/19/2009)   PMN:  58.1 (12/19/2009)   Lymphs:  32.5 (12/19/2009)   Monos:  8.3 (12/19/2009)   Eosinophils:  0.6 (12/19/2009)  Basophil:  0.5 (12/19/2009)  Complete Metabolic Panel   Glucose:  90 (12/19/2009)   Sodium:  143 (12/19/2009)   Potassium:  4.3 (12/19/2009)   Chloride:  107 (12/19/2009)   CO2:  27 (12/19/2009)   BUN:  9 (12/19/2009)   Creatinine:  0.5 (12/19/2009)   Albumin:  4.0 (12/19/2009)   Total Protein:  7.3 (12/19/2009)   Calcium:  9.3 (12/19/2009)   Total Bili:  0.4 (12/19/2009)   Alk Phos:  89 (12/19/2009)   SGPT (ALT):  21 (12/19/2009)   SGOT (AST):  18 (12/19/2009)   Medications Prior to Update: 1)  Famotidine 40 Mg Tabs (Famotidine) .... One By Mouth Once Daily For Heartburn 2)  Penlac 8 % Soln (Ciclopirox) .... Use Once Daily On Affected Nail(S). Once A Week Remove The Build-Up With An Alcohol Swab 3)  Percocet 7.5-500 Mg Tabs (Oxycodone-Acetaminophen) .... One By Mouth Qid As Needed For Pain  Current Medications (verified): 1)  Zantac 150 Mg Tabs (Ranitidine Hcl) 2)  Penlac 8 % Soln (Ciclopirox) .... Use Once Daily On Affected Nail(S). Once A Week Remove The Build-Up With  An Alcohol Swab 3)  Percocet 7.5-500 Mg Tabs (Oxycodone-Acetaminophen) .... One By Mouth Qid As Needed For Pain  Allergies (verified): 1)  ! 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 Family History of Arthritis Family History Diabetes 1st degree relative Family History High cholesterol Family History Hypertension  Social History: Reviewed history from 12/19/2009 and no changes required. 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  The patient denies anorexia, fever, weight loss, hoarseness, chest pain, syncope, dyspnea on exertion, prolonged cough, headaches, hemoptysis, abdominal pain, suspicious skin lesions, difficulty walking, depression, abnormal bleeding, enlarged lymph nodes, angioedema, and breast masses.    Physical Exam  General:  alert, well-developed, well-nourished, well-hydrated, appropriate dress, normal appearance, healthy-appearing, cooperative to examination, good hygiene, and overweight-appearing.     Head:  normocephalic, atraumatic, no abnormalities observed, and no abnormalities palpated.   Eyes:  vision grossly intact, pupils equal, pupils round, and pupils reactive to light.   Ears:  R ear normal and L ear normal.   Mouth:  Oral mucosa and oropharynx without lesions or exudates.  Teeth in good repair. Neck:  supple, full ROM, no masses, no thyromegaly, no JVD, normal carotid upstroke, no carotid bruits, and no cervical lymphadenopathy.   Chest Wall:  No deformities, masses, or tenderness noted. Lungs:  Normal respiratory effort, chest expands symmetrically. Lungs are clear to auscultation, no crackles or wheezes. Heart:  Normal rate and regular rhythm. S1 and S2 normal without gallop, murmur, click, rub or other extra sounds. Abdomen:  Bowel sounds positive,abdomen soft and non-tender without masses, organomegaly  or hernias noted. Rectal:  No external abnormalities noted. Normal sphincter tone. No rectal masses or tenderness. Msk:  normal ROM, no joint tenderness, no joint swelling, no joint warmth, no redness over joints, no joint deformities, no joint instability, and no crepitation.   Pulses:  R and L carotid,radial,femoral,dorsalis pedis and posterior tibial pulses are full and equal bilaterally Extremities:  1+ left pedal edema and 1+ right pedal edema.   Neurologic:  alert & oriented X3, cranial nerves II-XII intact, strength normal in all extremities, sensation intact to light touch, sensation intact to pinprick, gait normal, and DTRs symmetrical and normal.   Skin:  turgor normal, color normal, no rashes, no suspicious lesions, no ecchymoses, no petechiae, no purpura, no ulcerations, and no edema.   Cervical Nodes:  no anterior cervical adenopathy and no posterior cervical adenopathy.   Axillary Nodes:  no R axillary adenopathy and no L axillary adenopathy.   Inguinal Nodes:  no R inguinal adenopathy and no L inguinal adenopathy.   Psych:  Cognition and judgment appear intact.  Alert and cooperative with normal attention span and concentration. No apparent delusions, illusions, hallucinations Additional Exam:  EKG shows poor RWP in V1-V3 but no q waves and no acute ST-T wave changes.   Impression & Recommendations:  Problem # 1:  EDEMA- LOCALIZED (ICD-782.3) Assessment New will get an ECHO done to look for diastolic dysfunction, low EF, etc. Orders: TLB-Lipid Panel (80061-LIPID) TLB-BMP (Basic Metabolic Panel-BMET) (80048-METABOL) TLB-CBC Platelet - w/Differential (85025-CBCD) TLB-Hepatic/Liver Function Pnl (80076-HEPATIC) TLB-TSH (Thyroid Stimulating Hormone) (84443-TSH) TLB-BNP (B-Natriuretic Peptide) (83880-BNPR) T-D-Dimer Fibrin Derivatives Quantitive [phone]) EKG w/ Interpretation (93000) Echo Referral (Echo)  Problem # 2:  GERD (ICD-530.81) Assessment: Unchanged  Her updated medication list for this problem includes:    Zantac 150 Mg Tabs (Ranitidine hcl)  Labs Reviewed: Hgb: 12.5 (12/19/2009)   Hct: 36.7 (12/19/2009)  Problem # 3:  ROUTINE GENERAL MEDICAL EXAM@HEALTH  CARE FACL (ICD-V70.0) Assessment: New  Mammogram: Normal Bilateral (10/02/2009) Pap smear: Normal (09/24/2009) Colonoscopy: Location:  [facility].   (08/29/2007) Flu Vax: Fluvax 3+ (01/26/2008)   TSH: 0.91 (08/12/2007)    Discussed using sunscreen, use of alcohol, drug use, self breast exam, routine dental care, routine eye care, schedule for GYN exam, routine physical exam, seat belts, multiple vitamins, osteoporosis prevention, adequate calcium intake in diet, recommendations for immunizations, mammograms and Pap smears.  Discussed exercise and checking cholesterol.    Problem # 4:  ABNORMAL ELECTROCARDIOGRAM (ICD-794.31) Assessment: New  Orders: Echo Referral (Echo)  Complete Medication List: 1)  Zantac 150 Mg Tabs (Ranitidine hcl) 2)  Penlac 8 % Soln (Ciclopirox) .... Use once daily on affected nail(s). once a week remove the build-up with an alcohol  swab 3)  Percocet 7.5-500 Mg Tabs (Oxycodone-acetaminophen) .... One by mouth qid as needed for pain  Other Orders: Venipuncture (83151)  Dyspepsia Assessment/Plan:  Step Therapy: GERD Treatment Protocols:    Step-1: started  Colorectal Screening:  Current Recommendations:    Hemoccult: NEG X 1 today  PAP Screening:    Hx Cervical Dysplasia in last 5 yrs? No    3 normal PAP smears in last 5 yrs? Yes    Last PAP smear:  09/24/2009    Reviewed PAP smear recommendations:  patient defers to GYN provider  PAP Smear Results:    Date of Exam:  09/24/2009    Results:  Normal  Mammogram Screening:    Last Mammogram:  10/02/2009  Mammogram Results:    Date of Exam:  10/02/2009    Results:  Normal Bilateral  Osteoporosis Risk Assessment:  Risk Factors for Fracture or Low Bone Density:   Smoking status:       never  Immunization & Chemoprophylaxis:    Influenza vaccine: Fluvax 3+  (01/26/2008)  Patient Instructions: 1)  Please schedule a follow-up appointment in 2 weeks. 2)  Limit your Sodium (Salt) to less than 4 grams a day (slightly less than 1 teaspoon) to prevent fluid retention, swelling, or worsening or symptoms. 3)  Avoid foods high in acid (tomatoes, citrus juices, spicy foods). Avoid eating within two hours of lying down or before exercising. Do not over eat; try smaller more frequent meals. Elevate head of bed twelve inches when sleeping. 4)  You need to have a Pap Smear to prevent cervical cancer.   Orders Added: 1)  Venipuncture [36415] 2)  TLB-Lipid Panel [80061-LIPID] 3)  TLB-BMP (Basic Metabolic Panel-BMET) [80048-METABOL] 4)  TLB-CBC Platelet - w/Differential [85025-CBCD] 5)  TLB-Hepatic/Liver Function Pnl [80076-HEPATIC] 6)  TLB-TSH (Thyroid Stimulating Hormone) [84443-TSH] 7)  TLB-BNP (B-Natriuretic Peptide) [83880-BNPR] 8)  T-D-Dimer Fibrin Derivatives Quantitive [[id number]] 9)  EKG w/ Interpretation [93000] 10)  Echo Referral [Echo] 11)  Est.  Patient Level V [59563]

## 2010-06-10 NOTE — Progress Notes (Signed)
Summary: triage   Phone Note Call from Patient Call back at Home Phone [phone]   Caller: Patient Call For: Dr. Christella Hartigan Reason for Call: Talk to Nurse Details for Reason: triage Summary of Call: pt doesnt want to wait until mid-Septe,ber for an appt... complaining of abd pain Initial call taken by: Vallarie Mare,  December 06, 2008 9:40 AM  Follow-up for Phone Call        pt had colon 4/09 and was having the same abd pain then, she was told to start fiber and the pain resolved.  She has stopped taking the fiber and the pain has returned.  She was instructed to slowly start on fiber and increase to a full capful over a weeks time.  She will call if the pain worsens or gets no better.  She was told that the fiber may cause some temporary bloating and gas but would resolve over time.  Appt was made for 01/09/09.  She has a soft bowel movement on average every other day.  No fever, no rectal bleeding. Chales Abrahams CMA Duncan Dull)  December 06, 2008 9:59 AM  Additional Follow-up for Phone Call Additional follow up Details #1::        i agree Additional Follow-up by: Rachael Fee MD,  December 06, 2008 10:13 AM

## 2010-06-10 NOTE — Assessment & Plan Note (Signed)
Summary: ?spider bite on hand-lb   Vital Signs:  Patient profile:   50 year old female Height:      65 inches Weight:      209 pounds BMI:     34.91 O2 Sat:      97 % on Room air Temp:     97.8 degrees F oral Pulse rate:   71 / minute Pulse rhythm:   regular Resp:     16 per minute BP sitting:   142 / 80  (left arm) Cuff size:   large  Vitals Entered By: Rock Nephew CMA (October 23, 2009 3:32 PM)  Nutrition Counseling: Patient's BMI is greater than 25 and therefore counseled on weight management options.  O2 Flow:  Room air  Primary Care Provider:  Etta Grandchild MD   History of Present Illness: She returns and complains that she has had problems with her skin for about 6 months with small red bumps that develop in multiple different areas ( arms, legs, hands ). The bumps begin with burning pain and a small vesicle that breaks and then itching and swelling develop. She has not seen ants, bugs, or fleas but visits her mother who owns a dog. The most severe lesion she has today is an area on the top of her left hand with itching and swelling.  Current Medications (verified): 1)  Famotidine 40 Mg Tabs (Famotidine) .... One By Mouth Once Daily For Heartburn 2)  Mupirocin 2 % Oint (Mupirocin) .... Use Two Times A Day W/dressing Change 3)  Penlac 8 % Soln (Ciclopirox) .... Use Once Daily On Affected Nail(S). Once A Week Remove The Build-Up With An Alcohol Swab  Allergies (verified): 1)  ! 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 Family History of Arthritis Family History Diabetes 1st degree relative Family History High cholesterol Family History Hypertension  Social History: Reviewed history from 01/26/2008 and no changes required. 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 Derm:  Complains of insect bite(s), itching, and lesion(s); denies changes in color of skin, changes in nail beds, dryness, excessive perspiration, flushing, poor wound healing, and rash.  Physical Exam  General:  alert, well-developed, well-nourished, well-hydrated, normal appearance, cooperative to examination, and overweight-appearing.   Head:  normocephalic and atraumatic.   Eyes:  vision grossly intact and no injection.   Mouth:  Oral mucosa and oropharynx without lesions or exudates.  Teeth in good repair. Neck:  No deformities, masses, or tenderness noted. Lungs:  Normal respiratory effort, chest expands symmetrically. Lungs are clear to auscultation, no crackles or wheezes. Heart:  Normal rate and regular rhythm. S1 and S2 normal without gallop, murmur, click, rub or other extra sounds. Abdomen:  Bowel sounds positive,abdomen soft and non-tender without masses, organomegaly or hernias noted.  Msk:  No deformity or scoliosis noted of thoracic or lumbar spine.   Pulses:  R and L carotid,radial,femoral,dorsalis pedis and posterior tibial pulses are full and equal bilaterally Extremities:  No clubbing, cyanosis, edema, or deformity noted with normal full  range of motion of all joints.   Neurologic:  alert & oriented X3, cranial nerves II-XII intact, strength normal in all extremities, and gait normal.   Skin:  she has about 10 lesions noted on her right upper arm, left hand, and lle. on the dorsum of the left hand there is an excoriation with very mild surrounding erythema and swelling but no exudate, induration, ttp, crepitance, or streaking. on the right upper arm there are 3 lesions that appear to be old vesicles that have become excoriated and now have a faint scab with mild surrounding erythema, again no exudate or induration. the lesion on her lle has healed with a scab. Cervical Nodes:  no anterior cervical adenopathy and no posterior cervical adenopathy.   Axillary Nodes:  no R axillary adenopathy and no L axillary adenopathy.   Inguinal Nodes:  no R inguinal adenopathy and no L inguinal adenopathy.   Psych:  Cognition and judgment appear intact. Alert and cooperative with normal attention span and concentration. No apparent delusions, illusions, hallucinations   Impression & Recommendations:  Problem # 1:  SKIN RASH (ICD-782.1) Assessment New I favor that these are bug bites but would like to look for systemic illness with labs and consider derm referral ? need for biopsy, will give depo-medrol IM today  Her updated medication list for this problem includes:    Penlac 8 % Soln (Ciclopirox) ..... Use once daily on affected nail(s). once a week remove the build-up with an alcohol swab  Orders: Venipuncture (54098) TLB-CBC Platelet - w/Differential (85025-CBCD) TLB-BMP (Basic Metabolic Panel-BMET) (80048-METABOL) TLB-Sedimentation Rate (ESR) (85652-ESR) TLB-CRP-High Sensitivity (C-Reactive Protein) (86140-FCRP) Dermatology Referral (Derma) Admin of Therapeutic Inj  intramuscular or subcutaneous (11914) Depo- Medrol 40mg  (J1030) Depo- Medrol 80mg  (J1040)  Complete Medication List: 1)  Famotidine 40 Mg Tabs (Famotidine) .... One by  mouth once daily for heartburn 2)  Penlac 8 % Soln (Ciclopirox) .... Use once daily on affected nail(s). once a week remove the build-up with an alcohol swab  Patient Instructions: 1)  Please schedule a follow-up appointment in 2 weeks.   Medication Administration  Injection # 1:    Medication: Depo- Medrol 80mg     Diagnosis: SKIN RASH (ICD-782.1)    Route: IM    Site: L deltoid    Exp Date: 08/2012    Lot #: [id number]    Mfr: pfizer    Patient tolerated injection without complications    Given by: Rock Nephew CMA (October 23, 2009 4:00 PM)  Injection # 2:    Medication: Depo- Medrol 40mg     Diagnosis: SKIN RASH (ICD-782.1)    Route: IM    Site: L deltoid    Exp Date: 08/2012    Lot #: [id number]    Mfr: pfizer    Patient tolerated injection without complications    Given by: Rock Nephew CMA (October 23, 2009 4:00 PM)  Orders Added: 1)  Venipuncture [78295] 2)  TLB-CBC Platelet - w/Differential [85025-CBCD] 3)  TLB-BMP (Basic Metabolic Panel-BMET) [80048-METABOL] 4)  TLB-Sedimentation Rate (ESR) [85652-ESR] 5)  TLB-CRP-High Sensitivity (C-Reactive Protein) [86140-FCRP] 6)  Dermatology Referral [Derma] 7)  Admin of Therapeutic Inj  intramuscular or subcutaneous [96372] 8)  Depo- Medrol 40mg  [J1030] 9)  Depo- Medrol 80mg  [J1040]  10)  Est. Patient Level IV [04540]

## 2010-06-10 NOTE — Progress Notes (Signed)
Summary: triage   Phone Note Call from Patient Call back at Home Phone [phone]   Caller: Patient Call For: Makiah Foye Reason for Call: Talk to Nurse Summary of Call: worsening abd pain wants to be seen asap Initial call taken by: Tawni Levy,  December 10, 2008 11:34 AM  Follow-up for Phone Call        pt callling to say she has an appt with her PCP this friday and will call if she has further concerns will keep appt with Dr Christella Hartigan on 01/09/09 Follow-up by: Chales Abrahams CMA (AAMA),  December 10, 2008 11:42 AM

## 2010-07-14 ENCOUNTER — Ambulatory Visit (INDEPENDENT_AMBULATORY_CARE_PROVIDER_SITE_OTHER): Payer: Self-pay | Admitting: Obstetrics and Gynecology

## 2010-07-14 DIAGNOSIS — N912 Amenorrhea, unspecified: Secondary | ICD-10-CM

## 2010-08-04 ENCOUNTER — Other Ambulatory Visit (INDEPENDENT_AMBULATORY_CARE_PROVIDER_SITE_OTHER): Payer: 59

## 2010-08-04 DIAGNOSIS — N731 Chronic parametritis and pelvic cellulitis: Secondary | ICD-10-CM

## 2010-08-04 DIAGNOSIS — N949 Unspecified condition associated with female genital organs and menstrual cycle: Secondary | ICD-10-CM

## 2010-08-04 DIAGNOSIS — N390 Urinary tract infection, site not specified: Secondary | ICD-10-CM

## 2010-09-23 NOTE — Op Note (Signed)
NAMEFAUSTINA, Betty Bell             ACCOUNT NO.:  [id number]   MEDICAL RECORD NO.:  [id number]          PATIENT TYPE:  AMB   LOCATION:  [facility]                         FACILITY:  [facility]   PHYSICIAN:  Daniel L. Gottsegen, M.D.DATE OF BIRTH:  1960-10-12   DATE OF PROCEDURE:  11/21/2007  DATE OF DISCHARGE:                               OPERATIVE REPORT   PREOPERATIVE DIAGNOSIS:  Dysfunctional uterine bleeding.   POSTOPERATIVE DIAGNOSIS:  Dysfunctional uterine bleeding, endometrial  polyps.   OPERATION:  Hysteroscopy dilation and curettage.   SURGEON:  Daniel L. Eda Paschal, M.D.   ANESTHESIA:  General.   INDICATIONS:  The patient is a 50 year old gravida 1, para 0, AB 1, who  had presented to the office with a history of menometrorrhagia.  This  occurred after normal periods over the past several years.  An attempt  was made to do an endometrial biopsy in the office, however, because of  distortion of her cervix and cervical stenosis, and her nulliparous  state  this was not possible.  She was started on Megace which was  switched to Ovcon because of nausea and this stopped her bleeding and  she now enters the hospital for hysteroscopy, endometrial sampling and  appropriate surgery.   FINDINGS:  External is normal, B U S is normal.  Vaginal is normal.  Cervix is clean.  Uterus is top normal size and shape and slightly  deviated to the left.  Adnexa failed to reveal any masses.  At the time  of hysteroscopy, the patient had multiple small endometrial polyps.  After they had been removed her cavity was clean.  The top of the  fundus, tubal ostia, anterior and posterior walls of the fundus, lower  uterine segment and endocervical canal were all seen.   PROCEDURE:  After adequate general anesthesia, the patient was placed in  the dorsal supine position, prepped and draped in usual sterile manner.  A single-tooth tenaculum was placed in the anterior lip of the cervix.  An attempt was made  to dilate the cervix.  This was done very gingerly  and carefully because of her stenosis.  There was distortion of the  canal but taking good care, the cervix could be dilated up to a #23  Pratt dilator.  Using a diagnostic hysteroscope, the patient was  hysteroscoped and findings consisted of small endometrial polyps.  The  hysteroscope was removed.  It was felt that it would be somewhat  difficult to dilate her to use a resectoscope, so instead, a sharp  curette was used and the entire cavity was curetted vigorously, polypoid  tissue was removed.  This along with endometrial curettings was sent to  pathology for tissue diagnosis.  She was rehysteroscoped and at this  point she had  a clean cavity.  The procedure was therefore terminated.  Estimated  blood loss for the entire procedure was minimal.  Fluid deficit was less  than 100 mL.  The patient tolerated procedure well and left the  operating room in satisfactory condition.      Daniel L. Eda Paschal, M.D.  Electronically Signed  DLG/MEDQ  D:  11/21/2007  T:  11/21/2007  Job:  [id number]

## 2010-09-23 NOTE — Assessment & Plan Note (Signed)
[facility]                         GASTROENTEROLOGY OFFICE NOTE   Betty Bell, Betty Bell                      MRN:          [id number]  DATE:08/12/2007                            DOB:          07/22/1960    GYNECOLOGIST:  Dr. Eda Paschal.   GI PROBLEM LIST:  1. Gastroesophageal reflux disease symptoms.  Pyrosis worse at night,      nearly daily.  Underwent endoscopy by Dr. Dortha Kern approximately      2003, and was told that she had acid damage to her esophagus.      Tried Nexium, but had palpitations, so has been on Zantac more      recently.  2. Family history of colon cancer (her father died of colon cancer).      Last colonoscopy in 2003.  She should have had one in 2008.   INTERVAL HISTORY:  I last saw Shatori 2 years ago.  I recommended she  have a colonoscopy in 2008.  She does not seem to have followed through  with those recommendations.  She has been bothered by lower abdominal  discomfort that have been happening intermittently, and then more  constant for the past 6 months.  The pain has been getting more constant  over about 1 month's time.  She has had this worked up by her  gynecologist.  She tells me that pelvic ultrasound showed some  fibrocystic disease, but was otherwise normal.  He thought that it might  be GI related, so sent her back here.  The pain is definitely improved  when she moves her bowels, and she says she tends to be constipated,  moving her bowels probably 2 to 3 times a week only.  She usually has to  strain to move her bowels.  She has never seen blood in her stool.  She  has tried dietary fiber with usually good relief of her constipation and  of her abdominal discomfort, but says it causes some bloating.   CURRENT MEDICATIONS:  Pepcid, she takes twice a day.   PHYSICAL EXAMINATION:  Weight 200 pounds, which is up 14 pounds since  her last visit.  Blood pressure 120/70.  Pulse 76.  CONSTITUTIONAL:  Generally  well appearing.  ABDOMEN:  Soft, nontender, non-distended with normal bowel sounds.   ASSESSMENT AND PLAN:  A 50 year old woman with chronic gastroesophageal  reflux disease, family history of colon cancer, rather chronic  constipation, new abdominal pain.   First, her lower abdominal discomforts do seem related to her bowels.  She tends to be constipated on a rather chronic basis, and so I am  recommending that she get on Citrucel fiber supplementation slowly  titrating up to a full heaping spoonful a day over about a 2-week  course.  She is also due for colonoscopy for family history of colon  cancer.  We will arrange for that to be  done at her soonest convenience.  Lastly, I will get a basic set of labs  including a CBC, complete metabolic profile, and thyroid study.     Rachael Fee, MD  Electronically Signed  DPJ/MedQ  DD: 08/12/2007  DT: 08/12/2007  Job #: [id number]   cc:   Reuel Boom L. Eda Paschal, M.D.

## 2010-09-26 NOTE — Op Note (Signed)
Betty Bell, DAYLEY                         ACCOUNT NO.:  [id number]   MEDICAL RECORD NO.:  [id number]                   PATIENT TYPE:  AMB   LOCATION:  [facility]                                 FACILITY:  [facility]   PHYSICIAN:  Daniel L. Eda Paschal, M.D.           DATE OF BIRTH:  1960-09-14   DATE OF PROCEDURE:  08/14/2003  DATE OF DISCHARGE:                                 OPERATIVE REPORT   PREOPERATIVE DIAGNOSES:  High grade dysplasia of the cervix.   POSTOPERATIVE DIAGNOSES:  High grade dysplasia of the cervix.   OPERATION:  Colposcopy with laser ablation of cervix.   SURGEON:  Daniel L. Eda Paschal, M.D.   ANESTHESIA:  General.   INDICATIONS FOR PROCEDURE:  The patient is a 50 year old female who was seen  in the office and had a PAP smear showing high grade dysplasia.  Colposcopy  was done in the office and she had two areas of white epithelium, one  between 4 and 6 o'clock was extremely exterior on the cervix almost where  the cervix met the vagina. Biopsies of that area showed high grade  dysplasia. There was also a biopsy closer to the squamocolumnar junction  that showed low grade dysplasia.  As a result of this, she enters the  hospital. An attempt might be made to do a LEEP but it is felt that it  probably will be too wide to be accomplished successfully without causing  bleeding and therefore she will have a laser ablation if that is more  appropriate.   FINDINGS:  At the time of surgery, the patient was recolposcoped and  findings were exactly as they were in the office.  It was felt that the area  of high grade dysplasia was too far out on the portio to allow a LEEP and  because adequate sampling had already been done in the office it was felt  that laser ablation was the appropriate procedure.  Other than this,  findings on pelvic examination were normal except for the fact that her  uterus is slightly fixed making the cervix deviate slightly to the patient's   right.   DESCRIPTION OF PROCEDURE:  After adequate general anesthesia, the patient  was placed in dorsal lithotomy position and prepped and draped in usual  sterile manner. She was colposcoped, the findings were as noted above. It  was felt at this point that the best procedure to eliminated her dysplasia  was a laser ablative cone. The CO2 laser was used to 10 watts continuous  power. The entire area of the squamocolumnar junction and the abnormal white  epithelium was outlined with the laser and then in quadrants the area was  laser ablated to a depth of 7-8 mm.  No bleeding was encountered whatsoever.  Monsel was used at the termination of the procedure for additional  hemostasis.  The patient tolerated the procedure well and left the operating  room in satisfactory condition.  Daniel L. Eda Paschal, M.D.    Tonette Bihari  D:  08/14/2003  T:  08/14/2003  Job:  [id number]

## 2010-11-21 ENCOUNTER — Ambulatory Visit (INDEPENDENT_AMBULATORY_CARE_PROVIDER_SITE_OTHER): Payer: 59 | Admitting: Obstetrics and Gynecology

## 2010-11-21 DIAGNOSIS — N898 Other specified noninflammatory disorders of vagina: Secondary | ICD-10-CM

## 2010-11-21 DIAGNOSIS — R82998 Other abnormal findings in urine: Secondary | ICD-10-CM

## 2010-11-21 DIAGNOSIS — N949 Unspecified condition associated with female genital organs and menstrual cycle: Secondary | ICD-10-CM

## 2010-11-21 DIAGNOSIS — B373 Candidiasis of vulva and vagina: Secondary | ICD-10-CM

## 2010-11-27 ENCOUNTER — Ambulatory Visit (INDEPENDENT_AMBULATORY_CARE_PROVIDER_SITE_OTHER): Payer: 59 | Admitting: Obstetrics and Gynecology

## 2010-11-27 DIAGNOSIS — B373 Candidiasis of vulva and vagina: Secondary | ICD-10-CM

## 2010-11-27 DIAGNOSIS — R1011 Right upper quadrant pain: Secondary | ICD-10-CM

## 2010-11-27 DIAGNOSIS — R1012 Left upper quadrant pain: Secondary | ICD-10-CM

## 2010-11-27 DIAGNOSIS — N898 Other specified noninflammatory disorders of vagina: Secondary | ICD-10-CM

## 2010-11-27 DIAGNOSIS — R82998 Other abnormal findings in urine: Secondary | ICD-10-CM

## 2010-11-27 DIAGNOSIS — N390 Urinary tract infection, site not specified: Secondary | ICD-10-CM

## 2011-02-03 ENCOUNTER — Encounter: Payer: Self-pay | Admitting: Endocrinology

## 2011-02-03 ENCOUNTER — Ambulatory Visit (INDEPENDENT_AMBULATORY_CARE_PROVIDER_SITE_OTHER): Payer: 59 | Admitting: Endocrinology

## 2011-02-03 VITALS — BP 122/78 | HR 90 | Temp 98.2°F | Ht 65.5 in | Wt 212.0 lb

## 2011-02-03 DIAGNOSIS — J069 Acute upper respiratory infection, unspecified: Secondary | ICD-10-CM

## 2011-02-03 MED ORDER — PROMETHAZINE-CODEINE 6.25-10 MG/5ML PO SYRP: 5.0000 mL | ORAL_SOLUTION | ORAL | Status: AC | PRN

## 2011-02-03 MED ORDER — AZITHROMYCIN 500 MG PO TABS: 500.0000 mg | ORAL_TABLET | Freq: Every day | ORAL | Status: AC

## 2011-02-03 NOTE — Progress Notes (Signed)
  Subjective:    Patient ID: Betty Bell, female    DOB: 1961-01-08, 50 y.o.   MRN: [id number]  HPI Pt states 5 days of moderate congestion in the nose, and assoc prod cough.  She also has left otalgia, headache, and slight wheezing.   Past Medical History  Diagnosis Date  . GERD (gastroesophageal reflux disease)   . Uterine fibroid     Past Surgical History  Procedure Date  . Uterine fibroid embolization     History   Social History  . Marital Status: Divorced    Spouse Name: N/A    Number of Children: N/A  . Years of Education: N/A   Occupational History  . Not on file.   Social History Main Topics  . Smoking status: Never Smoker   . Smokeless tobacco: Not on file   Comment: Regular Exercise - Yes  . Alcohol Use: No  . Drug Use: No  . Sexually Active:    Other Topics Concern  . Not on file   Social History Narrative  . No narrative on file    No current outpatient prescriptions on file prior to visit.    Allergies  Allergen Reactions  . Aspirin     REACTION: Swelling    Family History  Problem Relation Age of Onset  . Arthritis Other   . Diabetes Other   . Hyperlipidemia Other   . Hypertension Other    BP 122/78  Pulse 90  Temp(Src) 98.2 F (36.8 C) (Oral)  Ht 5' 5.5" (1.664 m)  Wt 212 lb (96.163 kg)  BMI 34.74 kg/m2  SpO2 98%  Review of Systems Denies fever, but she has myalgias and sore throat.  Denies n/v    Objective:   Physical Exam VITAL SIGNS:  See vs page GENERAL: no distress head: no deformity eyes: no periorbital swelling, no proptosis external nose and ears are normal mouth: no lesion seen Ears: left tm is red, but the right is normal LUNGS:  Clear to auscultation     Assessment & Plan:  Glenford Peers, new

## 2011-02-03 NOTE — Patient Instructions (Addendum)
i have sent a prescription to your pharmacy, for an antibiotic Here is a prescription for a cough medication here is a sample of "symbicort-80."  take 1 puff 2x a day.  rinse mouth after using. Loratadine-d (non-prescription) will help your congestion. I hope you feel better soon.  If you don't feel better by next week, please call doctor jones.

## 2011-02-23 ENCOUNTER — Other Ambulatory Visit: Payer: Self-pay | Admitting: Obstetrics and Gynecology

## 2011-02-23 DIAGNOSIS — Z1231 Encounter for screening mammogram for malignant neoplasm of breast: Secondary | ICD-10-CM

## 2011-03-04 ENCOUNTER — Ambulatory Visit (HOSPITAL_COMMUNITY): Payer: 59

## 2011-03-05 ENCOUNTER — Ambulatory Visit (INDEPENDENT_AMBULATORY_CARE_PROVIDER_SITE_OTHER): Payer: 59 | Admitting: Endocrinology

## 2011-03-05 ENCOUNTER — Encounter: Payer: Self-pay | Admitting: Endocrinology

## 2011-03-05 DIAGNOSIS — L509 Urticaria, unspecified: Secondary | ICD-10-CM | POA: Insufficient documentation

## 2011-03-05 MED ORDER — METHYLPREDNISOLONE (PAK) 4 MG PO TABS: ORAL_TABLET | ORAL | Status: AC

## 2011-03-05 NOTE — Progress Notes (Signed)
  Subjective:    Patient ID: Betty Bell, female    DOB: 11/26/1960, 50 y.o.   MRN: [id number]  HPI 10 hrs of moderate swelling at the face and left arm.  No assoc sob. Last week, she had similar sxs on the legs.  These sxs are improved but not resolved.  She is unable to cite precip factor, except she ate at a seafood restaurant last week and yesterday.   Past Medical History  Diagnosis Date  . GERD (gastroesophageal reflux disease)   . Uterine fibroid     Past Surgical History  Procedure Date  . Uterine fibroid embolization     History   Social History  . Marital Status: Divorced    Spouse Name: N/A    Number of Children: N/A  . Years of Education: N/A   Occupational History  . Not on file.   Social History Main Topics  . Smoking status: Never Smoker   . Smokeless tobacco: Not on file   Comment: Regular Exercise - Yes  . Alcohol Use: No  . Drug Use: No  . Sexually Active:    Other Topics Concern  . Not on file   Social History Narrative  . No narrative on file    Current Outpatient Prescriptions on File Prior to Visit  Medication Sig Dispense Refill  . ranitidine (ZANTAC) 150 MG tablet Take 150 mg by mouth as directed.          Allergies  Allergen Reactions  . Aspirin     REACTION: Swelling    Family History  Problem Relation Age of Onset  . Arthritis Other   . Diabetes Other   . Hyperlipidemia Other   . Hypertension Other     BP 126/78  Pulse 77  Temp(Src) 98.6 F (37 C) (Oral)  Ht 5\' 5"  (1.651 m)  Wt 215 lb (97.523 kg)  BMI 35.78 kg/m2  SpO2 98%  Review of Systems Denies fever and hematuria.     Objective:   Physical Exam VITAL SIGNS:  See vs page. GENERAL: no distress. Skin: there are 5x5 cm areas urticaria at the left maxillary area, and also on the left arm.       Assessment & Plan:  Urticaria, new

## 2011-03-05 NOTE — Patient Instructions (Addendum)
i have sent a prescription to your pharmacy. Loratadine (non-prescription) will help your symptoms sooner, but not as well. Refer to an allergy specialist.  you will receive a phone call, about a day and time for an appointment.

## 2011-03-06 ENCOUNTER — Telehealth: Payer: Self-pay

## 2011-03-06 NOTE — Telephone Encounter (Signed)
Pt called stating that swelling of her eye has gotten worse. Pt says she did not take Claritin this morning but did yesterday along with Pred pak. Pt advised to take another Claritin while waiting for advisement form MD, please advise.

## 2011-03-06 NOTE — Telephone Encounter (Signed)
You money was not wasted.  This will get better with time.  the advice to go to the er was based on the ? Of involvement of your eye.

## 2011-03-06 NOTE — Telephone Encounter (Signed)
Go to er

## 2011-03-06 NOTE — Telephone Encounter (Signed)
Pt was advised of MD's advisement to go to ER. Pt is upset and feels like she wasted money yesterday coming in for OV, pt would like PCP's advisement or callback from physician with advisement. Pt states that she does not want to go to the ER.

## 2011-03-06 NOTE — Telephone Encounter (Signed)
Pt was very upset and after stating that she spent her money for an OV yesterday and now Dr Everardo All is "just throwing me to the side". I tried to explain to the patient that MD advised ER because of the location of swelling. Pt then stated that she has to pay a $200 copay for ER visits and all she wanted from MD was "comforting words". After the patient spent a few minute speaking very angrily to me she eventually hung up while I was still trying to talk with her.

## 2011-03-20 ENCOUNTER — Ambulatory Visit (HOSPITAL_COMMUNITY): Payer: 59

## 2011-03-30 ENCOUNTER — Encounter: Payer: Self-pay | Admitting: Internal Medicine

## 2011-03-30 ENCOUNTER — Other Ambulatory Visit (INDEPENDENT_AMBULATORY_CARE_PROVIDER_SITE_OTHER): Payer: 59

## 2011-03-30 ENCOUNTER — Ambulatory Visit (INDEPENDENT_AMBULATORY_CARE_PROVIDER_SITE_OTHER): Payer: 59 | Admitting: Internal Medicine

## 2011-03-30 DIAGNOSIS — L509 Urticaria, unspecified: Secondary | ICD-10-CM

## 2011-03-30 DIAGNOSIS — Z0000 Encounter for general adult medical examination without abnormal findings: Secondary | ICD-10-CM | POA: Insufficient documentation

## 2011-03-30 DIAGNOSIS — Z23 Encounter for immunization: Secondary | ICD-10-CM

## 2011-03-30 LAB — COMPREHENSIVE METABOLIC PANEL
ALT: 17 U/L (ref 0–35)
AST: 20 U/L (ref 0–37)
Albumin: 4 g/dL (ref 3.5–5.2)
Alkaline Phosphatase: 93 U/L (ref 39–117)
BUN: 11 mg/dL (ref 6–23)
CO2: 27 mEq/L (ref 19–32)
Calcium: 9 mg/dL (ref 8.4–10.5)
Chloride: 104 mEq/L (ref 96–112)
Creatinine, Ser: 0.7 mg/dL (ref 0.4–1.2)
GFR: 111.61 mL/min (ref >60.00)
Glucose, Bld: 89 mg/dL (ref 70–99)
Potassium: 4.1 mEq/L (ref 3.5–5.1)
Sodium: 139 mEq/L (ref 135–145)
Total Bilirubin: 0.6 mg/dL (ref 0.3–1.2)
Total Protein: 7.7 g/dL (ref 6.0–8.3)

## 2011-03-30 LAB — URINALYSIS, ROUTINE W REFLEX MICROSCOPIC
Bilirubin Urine: NEGATIVE
Ketones, ur: NEGATIVE
Nitrite: NEGATIVE
Specific Gravity, Urine: <=1.005 (ref 1.000–1.030)
Total Protein, Urine: NEGATIVE
Urine Glucose: NEGATIVE
Urobilinogen, UA: 0.2 (ref 0.0–1.0)
pH: 6.5 (ref 5.0–8.0)

## 2011-03-30 LAB — CBC WITH DIFFERENTIAL/PLATELET
Basophils Absolute: 0 10*3/uL (ref 0.0–0.1)
Basophils Relative: 0.4 % (ref 0.0–3.0)
Eosinophils Absolute: 0.1 10*3/uL (ref 0.0–0.7)
Eosinophils Relative: 1.4 % (ref 0.0–5.0)
HCT: 37.7 % (ref 36.0–46.0)
Hemoglobin: 12.7 g/dL (ref 12.0–15.0)
Lymphocytes Relative: 35.8 % (ref 12.0–46.0)
Lymphs Abs: 3.1 10*3/uL (ref 0.7–4.0)
MCHC: 33.6 g/dL (ref 30.0–36.0)
MCV: 83.5 fl (ref 78.0–100.0)
Monocytes Absolute: 0.7 10*3/uL (ref 0.1–1.0)
Monocytes Relative: 7.8 % (ref 3.0–12.0)
Neutro Abs: 4.7 10*3/uL (ref 1.4–7.7)
Neutrophils Relative %: 54.6 % (ref 43.0–77.0)
Platelets: 292 10*3/uL (ref 150.0–400.0)
RBC: 4.52 Mil/uL (ref 3.87–5.11)
RDW: 15.4 % — ABNORMAL HIGH (ref 11.5–14.6)
WBC: 8.6 10*3/uL (ref 4.5–10.5)

## 2011-03-30 LAB — LIPID PANEL
Cholesterol: 179 mg/dL (ref 0–200)
HDL: 55.8 mg/dL (ref >39.00)
LDL Cholesterol: 105 mg/dL — ABNORMAL HIGH (ref 0–99)
Total CHOL/HDL Ratio: 3
Triglycerides: 89 mg/dL (ref 0.0–149.0)
VLDL: 17.8 mg/dL (ref 0.0–40.0)

## 2011-03-30 LAB — TSH: TSH: 0.42 u[IU]/mL (ref 0.35–5.50)

## 2011-03-30 MED ORDER — HYDROXYZINE PAMOATE 25 MG PO CAPS: 25.0000 mg | ORAL_CAPSULE | Freq: Three times a day (TID) | ORAL | Status: AC | PRN

## 2011-03-30 NOTE — Patient Instructions (Signed)

## 2011-03-31 ENCOUNTER — Encounter: Payer: Self-pay | Admitting: Internal Medicine

## 2011-04-01 NOTE — Assessment & Plan Note (Signed)
I did not do her PAP or breast exam as she tells me that she is seeing her GYN next week but I did order her mammogram, today her exam is normal, she will get labs done, we updated her vaccines today, she was given pt ed material as well

## 2011-04-01 NOTE — Progress Notes (Signed)
Subjective:    Patient ID: Betty Bell, female    DOB: 1961-03-16, 50 y.o.   MRN: [id number]  HPI She returns for f/up and asks for a physical but also complains of persistent episodes of whelps on her face and arms, the last episode was a few days ago when a whelp developed around her left eye with swelling, it eventually resolved but she has itching and felt uncomfortable and she wants a med to take when these symptoms occur. She had previously been given a medrol pak and symptoms were much improved for a while.   Review of Systems  Constitutional: Negative for fever, chills, diaphoresis, activity change, appetite change, fatigue and unexpected weight change.  HENT: Negative for nosebleeds, congestion, sore throat, facial swelling, rhinorrhea, sneezing, trouble swallowing, neck pain, voice change, postnasal drip and sinus pressure.   Eyes: Negative.   Respiratory: Negative for apnea, cough, choking, chest tightness, shortness of breath, wheezing and stridor.   Cardiovascular: Negative for chest pain, palpitations and leg swelling.  Gastrointestinal: Negative for nausea, vomiting, abdominal pain, diarrhea, constipation, blood in stool, abdominal distention and anal bleeding.  Genitourinary: Negative for dysuria, urgency, frequency, hematuria, flank pain, decreased urine volume, vaginal bleeding, vaginal discharge, enuresis, difficulty urinating, vaginal pain, menstrual problem and dyspareunia.  Musculoskeletal: Negative for myalgias, back pain, joint swelling, arthralgias and gait problem.  Skin: Negative for color change, pallor, rash and wound.  Neurological: Negative for dizziness, tremors, seizures, syncope, facial asymmetry, speech difficulty, weakness, light-headedness, numbness and headaches.  Hematological: Negative for adenopathy. Does not bruise/bleed easily.  Psychiatric/Behavioral: Negative.        Objective:   Physical Exam  Vitals reviewed. Constitutional: She is  oriented to person, place, and time. She appears well-developed and well-nourished. No distress.  HENT:  Head: Normocephalic and atraumatic.  Mouth/Throat: Oropharynx is clear and moist. No oropharyngeal exudate.  Eyes: Conjunctivae are normal. Right eye exhibits no discharge. Left eye exhibits no discharge. No scleral icterus.  Neck: Normal range of motion. Neck supple. No JVD present. No tracheal deviation present. No thyromegaly present.  Cardiovascular: Normal rate, regular rhythm, normal heart sounds and intact distal pulses.  Exam reveals no gallop and no friction rub.   No murmur heard. Pulmonary/Chest: Effort normal and breath sounds normal. No stridor. No respiratory distress. She has no wheezes. She has no rales. She exhibits no tenderness.  Abdominal: Soft. Bowel sounds are normal. She exhibits no distension and no mass. There is no tenderness. There is no rebound and no guarding.  Musculoskeletal: Normal range of motion. She exhibits no edema and no tenderness.  Lymphadenopathy:    She has no cervical adenopathy.  Neurological: She is oriented to person, place, and time.  Skin: Skin is warm and dry. No rash noted. She is not diaphoretic. No erythema. No pallor.  Psychiatric: She has a normal mood and affect. Her behavior is normal. Judgment and thought content normal.      Lab Results  Component Value Date   WBC 8.6 03/30/2011   HGB 12.7 03/30/2011   HCT 37.7 03/30/2011   PLT 292.0 03/30/2011   GLUCOSE 89 03/30/2011   CHOL 179 03/30/2011   TRIG 89.0 03/30/2011   HDL 55.80 03/30/2011   LDLCALC 105* 03/30/2011   ALT 17 03/30/2011   AST 20 03/30/2011   NA 139 03/30/2011   K 4.1 03/30/2011   CL 104 03/30/2011   CREATININE 0.7 03/30/2011   BUN 11 03/30/2011   CO2 27 03/30/2011   TSH 0.42  03/30/2011      Assessment & Plan:

## 2011-04-01 NOTE — Assessment & Plan Note (Signed)
I have asked her to use vistaril as needed

## 2011-04-09 ENCOUNTER — Institutional Professional Consult (permissible substitution): Payer: 59 | Admitting: Internal Medicine

## 2011-04-21 ENCOUNTER — Ambulatory Visit (HOSPITAL_COMMUNITY)
Admission: RE | Admit: 2011-04-21 | Discharge: 2011-04-21 | Disposition: A | Discharge status: Home / Self Care | Payer: 59 | Source: Ambulatory Visit | Attending: Obstetrics and Gynecology | Admitting: Obstetrics and Gynecology

## 2011-04-21 DIAGNOSIS — Z1231 Encounter for screening mammogram for malignant neoplasm of breast: Secondary | ICD-10-CM | POA: Insufficient documentation

## 2011-05-22 ENCOUNTER — Institutional Professional Consult (permissible substitution): Payer: 59 | Admitting: Internal Medicine

## 2011-06-08 ENCOUNTER — Encounter: Payer: Self-pay | Admitting: Gynecology

## 2011-06-08 DIAGNOSIS — N879 Dysplasia of cervix uteri, unspecified: Secondary | ICD-10-CM | POA: Insufficient documentation

## 2011-06-08 DIAGNOSIS — N870 Mild cervical dysplasia: Secondary | ICD-10-CM | POA: Insufficient documentation

## 2011-06-08 DIAGNOSIS — N7011 Chronic salpingitis: Secondary | ICD-10-CM | POA: Insufficient documentation

## 2011-06-08 DIAGNOSIS — K219 Gastro-esophageal reflux disease without esophagitis: Secondary | ICD-10-CM | POA: Insufficient documentation

## 2011-06-08 DIAGNOSIS — N840 Polyp of corpus uteri: Secondary | ICD-10-CM | POA: Insufficient documentation

## 2011-06-30 ENCOUNTER — Ambulatory Visit (INDEPENDENT_AMBULATORY_CARE_PROVIDER_SITE_OTHER): Payer: 59 | Admitting: Obstetrics and Gynecology

## 2011-06-30 ENCOUNTER — Encounter: Payer: Self-pay | Admitting: Obstetrics and Gynecology

## 2011-06-30 ENCOUNTER — Other Ambulatory Visit (HOSPITAL_COMMUNITY)
Admission: RE | Admit: 2011-06-30 | Discharge: 2011-06-30 | Disposition: A | Discharge status: Home / Self Care | Payer: 59 | Source: Ambulatory Visit | Attending: Obstetrics and Gynecology | Admitting: Obstetrics and Gynecology

## 2011-06-30 VITALS — BP 130/84 | Ht 64.0 in | Wt 210.0 lb

## 2011-06-30 DIAGNOSIS — N949 Unspecified condition associated with female genital organs and menstrual cycle: Secondary | ICD-10-CM

## 2011-06-30 DIAGNOSIS — Z01419 Encounter for gynecological examination (general) (routine) without abnormal findings: Secondary | ICD-10-CM

## 2011-06-30 DIAGNOSIS — J309 Allergic rhinitis, unspecified: Secondary | ICD-10-CM | POA: Insufficient documentation

## 2011-06-30 DIAGNOSIS — R102 Pelvic and perineal pain: Secondary | ICD-10-CM

## 2011-06-30 LAB — URINALYSIS W MICROSCOPIC + REFLEX CULTURE
Bilirubin Urine: NEGATIVE
Glucose, UA: NEGATIVE mg/dL
Hgb urine dipstick: NEGATIVE
Ketones, ur: NEGATIVE mg/dL
Leukocytes, UA: NEGATIVE
Nitrite: NEGATIVE
Protein, ur: NEGATIVE mg/dL
Specific Gravity, Urine: <1.005 — ABNORMAL LOW (ref 1.005–1.030)
Urobilinogen, UA: 0.2 mg/dL (ref 0.0–1.0)
pH: 5.5 (ref 5.0–8.0)

## 2011-06-30 NOTE — Progress Notes (Signed)
Patient came to see me today for her annual GYN exam. Her cycles are now every 3-6 months. She has no other abnormal bleeding. She is having increasing hot flashes. They are not enough for intervention. She continues to have recurrent episodes of lower abdominal cramping. She has a diagnosis of both fibroids and hydrosalpinx. It has been almost 2 years since she had an ultrasound. She also gets some abdominal tightening. She is up-to-date on mammograms. She has been previously diagnosed with hydradenitis of the left axilla.  Physical examination: HEENT within normal limits. Neck: Thyroid not large. No masses. Supraclavicular nodes: not enlarged. Breasts: Examined in both sitting midline position. No skin changes and no masses. Left axilla shows a thickened area of 1-2 cm consistent with hydradenitis. Patient was told it was normal on mammogram. Abdomen: Soft no guarding rebound or masses or hernia. Pelvic: External: Within normal limits. BUS: Within normal limits. Vaginal:within normal limits. Good estrogen effect. No evidence of cystocele rectocele or enterocele. Cervix: clean. Uterus: Normal size and shape. Adnexa: No masses. Rectovaginal exam: Confirmatory and negative. Extremities: Within normal limits.  Assessment: #1. Pelvic pain #2. Hydradenitis left axilla of greater than one year duration #3. Hot flashes  Plan: Pelvic ultrasound for reassessment. Continue yearly mammograms. Lab work from her PCP.

## 2011-06-30 NOTE — Patient Instructions (Signed)
Return for pelvic ultrasound. 

## 2011-07-03 ENCOUNTER — Encounter: Payer: Self-pay | Admitting: Endocrinology

## 2011-07-03 ENCOUNTER — Ambulatory Visit (INDEPENDENT_AMBULATORY_CARE_PROVIDER_SITE_OTHER): Payer: 59 | Admitting: Endocrinology

## 2011-07-03 VITALS — BP 132/78 | HR 73 | Temp 98.1°F | Ht 65.0 in | Wt 214.2 lb

## 2011-07-03 DIAGNOSIS — M549 Dorsalgia, unspecified: Secondary | ICD-10-CM

## 2011-07-03 NOTE — Progress Notes (Signed)
  Subjective:    Patient ID: Betty Bell, female    DOB: [birth year], 51 y.o.   MRN: [id number]  HPI Pt states 1 week of slight pain at the left scapular area.  No assoc numbness Past Medical History  Diagnosis Date  . GERD (gastroesophageal reflux disease)   . Uterine fibroid   . Hydrosalpinx     Left and Right  . Cervical dysplasia     High Grade  . Endometrial polyp   . CIN I (cervical intraepithelial neoplasia I)   . Acid reflux   . Seasonal allergies     Past Surgical History  Procedure Date  . Uterine fibroid embolization   . Pelvic laparoscopy     DL  . Dilation and curettage of uterus   . Hysteroscopy   . Laser ablation of the cervix   . Hand surgery     History   Social History  . Marital Status: Divorced    Spouse Name: N/A    Number of Children: N/A  . Years of Education: N/A   Occupational History  . Not on file.   Social History Main Topics  . Smoking status: Never Smoker   . Smokeless tobacco: Not on file   Comment: Regular Exercise - Yes  . Alcohol Use: Yes     rare  . Drug Use: No  . Sexually Active: Yes    Birth Control/ Protection: Condom   Other Topics Concern  . Not on file   Social History Narrative  . No narrative on file    Current Outpatient Prescriptions on File Prior to Visit  Medication Sig Dispense Refill  . Acetaminophen (TYLENOL 8 HOUR PO) Take by mouth.      . ranitidine (ZANTAC) 150 MG tablet Take 150 mg by mouth as directed.          Allergies  Allergen Reactions  . Aspirin     REACTION: Swelling  . Cephalosporins   . Ciprofloxacin   . Macrobid   . Penicillins   . Sulfa Antibiotics     Family History  Problem Relation Age of Onset  . Diabetes Mother   . Hypertension Mother   . Diabetes Father   . Hypertension Father   . Prostate cancer Father   . Colon cancer Brother   . Lung cancer Brother   . Colon polyps Sister   . Hypertension Sister     BP 132/78  Pulse 73  Temp(Src) 98.1 F (36.7 C)  (Oral)  Ht 5\' 5"  (1.651 m)  Wt 214 lb 3.2 oz (97.16 kg)  BMI 35.64 kg/m2  SpO2 97%  LMP 03/26/2011    Review of Systems She recently had a cough, but no sob    Objective:   Physical Exam VITAL SIGNS:  See vs page GENERAL: no distress LUNGS:  Clear to auscultation Chest wall:  Slight tenderness over the left scapula. Left shoulder:  Full rom, but rom is painful at the left scapular area. Neck:  Full rom, but extension is painful at the left scapular area.        Assessment & Plan:  Left scapular pain, new.  uncertain etiology

## 2011-07-03 NOTE — Patient Instructions (Signed)
here are some samples of "vimovo" 500/20.  Take 1 daily. I hope you feel better soon.  If you don't feel better in a week or so, please call back.

## 2011-07-16 ENCOUNTER — Ambulatory Visit (INDEPENDENT_AMBULATORY_CARE_PROVIDER_SITE_OTHER): Payer: 59 | Admitting: Obstetrics and Gynecology

## 2011-07-16 ENCOUNTER — Ambulatory Visit (INDEPENDENT_AMBULATORY_CARE_PROVIDER_SITE_OTHER): Payer: 59

## 2011-07-16 DIAGNOSIS — D219 Benign neoplasm of connective and other soft tissue, unspecified: Secondary | ICD-10-CM

## 2011-07-16 DIAGNOSIS — Z780 Asymptomatic menopausal state: Secondary | ICD-10-CM

## 2011-07-16 DIAGNOSIS — D259 Leiomyoma of uterus, unspecified: Secondary | ICD-10-CM

## 2011-07-16 DIAGNOSIS — N949 Unspecified condition associated with female genital organs and menstrual cycle: Secondary | ICD-10-CM

## 2011-07-16 DIAGNOSIS — D251 Intramural leiomyoma of uterus: Secondary | ICD-10-CM

## 2011-07-16 DIAGNOSIS — R102 Pelvic and perineal pain: Secondary | ICD-10-CM

## 2011-07-16 DIAGNOSIS — N951 Menopausal and female climacteric states: Secondary | ICD-10-CM

## 2011-07-16 DIAGNOSIS — N7013 Chronic salpingitis and oophoritis: Secondary | ICD-10-CM

## 2011-07-16 NOTE — Progress Notes (Signed)
Patient came to see me today because of continued pelvic pain which she has previously had but is now more intense and is in her lower back. On ultrasound today her uterus is enlarged by 5 fibroids. Size is unchanged from previous ultrasound of years ago. Right ovary is normal. Left ovary is normal. She continues to have a hydrosalpinx of her left fallopian tube. Her cul-de-sac is free of fluid. She also palmate a day she is having more hot flashes and has not really had a normal For several months. Her endometrial cavity today could not be identified due to her fibroids.  Assessment: #1. Fibroids #2. Left hydrosalpinx #3. Pelvic pain with extension to the back #4. Hot flashes.  Plan: Discussed ultrasound. Discussed surgery which I think would have to be TAH, BSO. Patient to see a gastroenterologist to look for non-GYN cause of the above. She will inform when necessary. TSH and FSH drawn. We will call patient with results. She will decide after results about HRT.

## 2011-07-17 LAB — FOLLICLE STIMULATING HORMONE: FSH: 57.4 m[IU]/mL

## 2011-07-17 LAB — TSH: TSH: 0.805 u[IU]/mL (ref 0.350–4.500)

## 2011-11-30 ENCOUNTER — Telehealth: Payer: Self-pay

## 2011-11-30 NOTE — Telephone Encounter (Signed)
Pt called requesting a referral to a Dermatologist - skin rash

## 2011-11-30 NOTE — Telephone Encounter (Signed)
She needs to be seen.

## 2011-12-01 NOTE — Telephone Encounter (Signed)
APPT 2:30 July 24.

## 2011-12-02 ENCOUNTER — Ambulatory Visit: Payer: 59 | Admitting: Internal Medicine

## 2011-12-08 ENCOUNTER — Encounter: Payer: Self-pay | Admitting: Internal Medicine

## 2011-12-08 ENCOUNTER — Ambulatory Visit (INDEPENDENT_AMBULATORY_CARE_PROVIDER_SITE_OTHER): Payer: 59 | Admitting: Internal Medicine

## 2011-12-08 ENCOUNTER — Telehealth: Payer: Self-pay

## 2011-12-08 VITALS — BP 122/78 | HR 67 | Temp 97.0°F | Ht 65.5 in

## 2011-12-08 DIAGNOSIS — B09 Unspecified viral infection characterized by skin and mucous membrane lesions: Secondary | ICD-10-CM

## 2011-12-08 DIAGNOSIS — L299 Pruritus, unspecified: Secondary | ICD-10-CM

## 2011-12-08 DIAGNOSIS — R21 Rash and other nonspecific skin eruption: Secondary | ICD-10-CM

## 2011-12-08 MED ORDER — VALACYCLOVIR HCL 1 G PO TABS: 1000.0000 mg | ORAL_TABLET | Freq: Three times a day (TID) | ORAL | Status: AC

## 2011-12-08 MED ORDER — TRIAMCINOLONE ACETONIDE 0.1 % EX OINT: TOPICAL_OINTMENT | Freq: Two times a day (BID) | CUTANEOUS | Status: DC | PRN

## 2011-12-08 NOTE — Progress Notes (Signed)
  Subjective:    Patient ID: Betty Bell, female    DOB: July 25, 1960, 51 y.o.   MRN: [id number]  HPI  complains of recurrent "rash" Intermittent spells - outbreak red nodules with tiny vesicle/purulence Onset >2 years ago, flares every few months last 2-3 weeks associated with itch and burning prior to onset of rash Prior treatments with antibiotics, steroids and antihistamines ineffective Current outbreak began 3 weeks ago - LLE - improved in 10-14d time Then 3 days ago, onset new lesions on L anterior chest and stomach  Inconsistent outdoor exposure No other family with similar symptoms  Not improved with OTC anti-itch cream  Past Medical History  Diagnosis Date  . GERD (gastroesophageal reflux disease)   . Uterine fibroid   . Hydrosalpinx     Left and Right  . Cervical dysplasia     High Grade  . Endometrial polyp   . CIN I (cervical intraepithelial neoplasia I)   . Acid reflux   . Seasonal allergies      Review of Systems  Constitutional: Negative for fever and fatigue.  Respiratory: Negative for cough and shortness of breath.   Skin: Positive for rash.  Neurological: Negative for dizziness and headaches.       Objective:   Physical Exam BP 122/78  Pulse 67  Temp 97 F (36.1 C) (Oral)  Ht 5' 5.5" (1.664 m)  SpO2 97%  LMP 03/26/2011 Wt Readings from Last 3 Encounters:  07/03/11 214 lb 3.2 oz (97.16 kg)  06/30/11 210 lb (95.255 kg)  03/30/11 218 lb 4 oz (98.998 kg)   Constitutional: She appears well-developed and well-nourished. No distress. sister at side Neck: Normal range of motion. Neck supple. No JVD present. No thyromegaly present.  Cardiovascular: Normal rate, regular rhythm and normal heart sounds.  No murmur heard. No BLE edema. Pulmonary/Chest: Effort normal and breath sounds normal. No respiratory distress. She has no wheezes.  Skin: Cluster red nodules with central vesicle-like across 4in diameter area L anterior chest (8 nodules without  cellulitis or confluence) Another "patch" L of midline on anterior upper abdomen with 5 nodules, no ulceration or confluence -single nodule with excoriation at fold r side of neck. resolving lesions on inner left calf from prior episode 3 weeks ago. Remaining Skin is warm and dry. No erythema.  Psychiatric: She has a normal mood and affect. Her behavior is normal. Judgment and thought content normal.   Lab Results  Component Value Date   WBC 8.6 03/30/2011   HGB 12.7 03/30/2011   HCT 37.7 03/30/2011   PLT 292.0 03/30/2011   GLUCOSE 89 03/30/2011   CHOL 179 03/30/2011   TRIG 89.0 03/30/2011   HDL 55.80 03/30/2011   LDLCALC 105* 03/30/2011   ALT 17 03/30/2011   AST 20 03/30/2011   NA 139 03/30/2011   K 4.1 03/30/2011   CL 104 03/30/2011   CREATININE 0.7 03/30/2011   BUN 11 03/30/2011   CO2 27 03/30/2011   TSH 0.805 07/16/2011   Lab Results  Component Value Date   ESRSEDRATE 35* 12/19/2009       Assessment & Plan:  Rash and puritis - NOS - current outbreak on L anterior chest and L abdomen Appears viral (herpes family-like) on hx and exam so will tx with empiric Valtrex tid x 1 week Prior tx with antibiotics, systemic steroids and antihistamine ineffective at resolution, prevention or recurrence or symptomatic relief Also topical steroid for itch symptoms Refer to derm given chronicity > 86yr and recurrence

## 2011-12-08 NOTE — Telephone Encounter (Signed)
Pt called stating that Kenalog cream is not covered under her drug plan. Pt is requesting alternative, please advise.

## 2011-12-08 NOTE — Patient Instructions (Signed)
It was good to see you today. Valtrex for possible virus infection and triamcinolone for itch - Your prescription(s) have been submitted to your pharmacy. Please take as directed and contact our office if you believe you are having problem(s) with the medication(s). we'll make referral to dermatology . Our office will contact you regarding appointment(s) once made.

## 2011-12-09 MED ORDER — HYDROCORTISONE VALERATE 0.2 % EX OINT: TOPICAL_OINTMENT | Freq: Two times a day (BID) | CUTANEOUS | Status: DC

## 2011-12-09 NOTE — Telephone Encounter (Signed)
Pt advised of Rx change. Pt also says that she was advised by Pharmacist that side effects of Valtrex are severe and would be difficult for her to tolerate with her Hx of GERD. Pt is requesting an alternative medication, please advise.

## 2011-12-09 NOTE — Telephone Encounter (Signed)
Pt called back.  She is aware of Dr. Diamantina Monks advise.  She has not received the referral to dermatology yet.

## 2011-12-09 NOTE — Telephone Encounter (Signed)
Chang to [employer] [facility] - erx done If still not on formulary, will need to give me list of formulary options from which to choose - thanks

## 2011-12-09 NOTE — Telephone Encounter (Signed)
Called pt no answer LMOM RTC... 12/09/11@3 :41pm/LMB

## 2011-12-09 NOTE — Telephone Encounter (Signed)
i diagree with pharmacist's advice - valtrex is very well tolerated! There is no other antiviral i recommend -  It is ok to not take valtrex if pt is concerned, just use cream and follow up with derm as referred

## 2011-12-12 ENCOUNTER — Ambulatory Visit (INDEPENDENT_AMBULATORY_CARE_PROVIDER_SITE_OTHER): Payer: 59 | Admitting: Family Medicine

## 2011-12-12 ENCOUNTER — Encounter: Payer: Self-pay | Admitting: Family Medicine

## 2011-12-12 VITALS — BP 130/80 | Temp 97.5°F | Wt 209.0 lb

## 2011-12-12 DIAGNOSIS — R21 Rash and other nonspecific skin eruption: Secondary | ICD-10-CM

## 2011-12-12 MED ORDER — PREDNISONE 20 MG PO TABS: ORAL_TABLET | ORAL | Status: DC

## 2011-12-12 NOTE — Progress Notes (Signed)
[facility]  Date:  12/12/2011   Name:  Aliyanah Rozas   DOB:  1961-05-11   MRN:  [id number]  PCP:  Sanda Linger, MD    Chief Complaint: Follow-up   History of Present Illness:  Betty Bell is a 51 y.o. very pleasant female patient who presents with the following:  Pleasant patient who has been having a problem with intermittent rashes for about the last 2 years. She is seen several providers, and actually has an upcoming dermatological appointment. She is now teaching excessively. She was recently placed on a trial of some Valtrex, which has not helped at all. She has been trying using some topical steroids without much significant relief. She's also tried some antihistamines.  Patient Active Problem List  Diagnosis  . HERPES ZOSTER, SCALP, RIGHT  . FIBROIDS, UTERUS  . GERD  . Urticaria  . Routine general medical examination at a health care facility  . Hydrosalpinx  . Cervical dysplasia  . Endometrial polyp  . CIN I (cervical intraepithelial neoplasia I)  . Acid reflux  . Seasonal allergies    Past Medical History  Diagnosis Date  . GERD (gastroesophageal reflux disease)   . Uterine fibroid   . Hydrosalpinx     Left and Right  . Cervical dysplasia     High Grade  . Endometrial polyp   . CIN I (cervical intraepithelial neoplasia I)   . Acid reflux   . Seasonal allergies     Past Surgical History  Procedure Date  . Uterine fibroid embolization   . Pelvic laparoscopy     DL  . Dilation and curettage of uterus   . Hysteroscopy   . Laser ablation of the cervix   . Hand surgery     History  Substance Use Topics  . Smoking status: Never Smoker   . Smokeless tobacco: Not on file   Comment: Regular Exercise - Yes  . Alcohol Use: Yes     rare    Family History  Problem Relation Age of Onset  . Diabetes Mother   . Hypertension Mother   . Diabetes Father   . Hypertension Father   . Prostate cancer Father   .  Colon cancer Brother   . Lung cancer Brother   . Colon polyps Sister   . Hypertension Sister     Allergies  Allergen Reactions  . Aspirin     REACTION: Swelling  . Cephalosporins   . Ciprofloxacin   . Nitrofurantoin Monohyd Macro   . Penicillins   . Sulfa Antibiotics     Medication list has been reviewed and updated.  Current Outpatient Prescriptions on File Prior to Visit  Medication Sig Dispense Refill  . hydrocortisone valerate ointment (WESTCORT) 0.2 % Apply topically 2 (two) times daily.  45 g  0  . ranitidine (ZANTAC) 150 MG tablet Take 150 mg by mouth as directed.        . valACYclovir (VALTREX) 1000 MG tablet Take 1 tablet (1,000 mg total) by mouth 3 (three) times daily.  21 tablet  0  . Acetaminophen (TYLENOL 8 HOUR PO) Take by mouth.      . diphenhydrAMINE (SOMINEX) 25 MG tablet Take 25 mg by mouth daily as needed.        Review of Systems:   GEN: No acute illnesses, no fevers, chills. GI: No n/v/d, eating normally Pulm: No SOB Interactive and getting along well at home.  Otherwise, ROS is as per  the HPI.   Physical Examination: Filed Vitals:   12/12/11 1046  BP: 130/80  Temp: 97.5 F (36.4 C)   Filed Vitals:   12/12/11 1046  Weight: 209 lb (94.802 kg)   There is no height on file to calculate BMI. Ideal Body Weight:     GEN: WDWN, NAD, Non-toxic, Alert & Oriented x 3 HEENT: Atraumatic, Normocephalic.  Ears and Nose: No external deformity. EXTR: No clubbing/cyanosis/edema NEURO: Normal gait.  PSYCH: Normally interactive. Conversant. Not depressed or anxious appearing.  Calm demeanor.  SKIN: scattered and diffuse intermittent vesicular and nodular component with evidence of heavy excoriation.  Assessment and Plan:  1. Rash     I am not clear of the etiology, but she states it prednisone has helped her before. I think it is reasonable to give her a dose of prednisone, then by her some time until she can get into dermatology in the next month or  so  Orders Today:  No orders of the defined types were placed in this encounter.    Medications Today: (Includes new updates added during medication reconciliation) Meds ordered this encounter  Medications  . predniSONE (DELTASONE) 20 MG tablet    Sig: 2 tabs for 4 days, then 1 tab po for 3 days    Dispense:  11 tablet    Refill:  0     Hannah Beat, MD

## 2012-03-03 ENCOUNTER — Ambulatory Visit: Payer: 59 | Admitting: Obstetrics and Gynecology

## 2012-03-29 ENCOUNTER — Ambulatory Visit: Payer: 59 | Admitting: Obstetrics and Gynecology

## 2012-04-19 ENCOUNTER — Ambulatory Visit (INDEPENDENT_AMBULATORY_CARE_PROVIDER_SITE_OTHER): Payer: 59 | Admitting: Obstetrics and Gynecology

## 2012-04-19 DIAGNOSIS — N898 Other specified noninflammatory disorders of vagina: Secondary | ICD-10-CM

## 2012-04-19 DIAGNOSIS — L293 Anogenital pruritus, unspecified: Secondary | ICD-10-CM

## 2012-04-19 LAB — WET PREP FOR TRICH, YEAST, CLUE
Clue Cells Wet Prep HPF POC: NONE SEEN
Trich, Wet Prep: NONE SEEN
Yeast Wet Prep HPF POC: NONE SEEN

## 2012-04-19 MED ORDER — NYSTATIN-TRIAMCINOLONE 100000-0.1 UNIT/GM-% EX OINT: TOPICAL_OINTMENT | Freq: Two times a day (BID) | CUTANEOUS | Status: DC

## 2012-04-19 NOTE — Patient Instructions (Signed)
Call if symptoms persist

## 2012-04-19 NOTE — Progress Notes (Signed)
Patient came to see me today with a three-week history of vulvar itching associated with  vulvar dryness. She was on antibiotics this  Summer. She is having no vaginal symptoms. She has no vaginal discharge. Her dryness is confined to the outside. She is having no vaginal bleeding.She has not changed soaps or detergents.  Exam: Kennon Portela present. External: Within normal limits and without lesions. BUS: Within normal limits. Vaginal exam: Some atrophy but no discharge. Wet prep: Negative for yeast, amine,  trichomonas and clue  cells. Too numerous to count white blood cells and bacteria.  Assessment: Vulvitis  Plan: Mycolog cream twice a day for one week.

## 2012-04-28 ENCOUNTER — Ambulatory Visit (INDEPENDENT_AMBULATORY_CARE_PROVIDER_SITE_OTHER): Payer: 59 | Admitting: Internal Medicine

## 2012-04-28 ENCOUNTER — Encounter: Payer: Self-pay | Admitting: Internal Medicine

## 2012-04-28 ENCOUNTER — Ambulatory Visit (INDEPENDENT_AMBULATORY_CARE_PROVIDER_SITE_OTHER)
Admission: RE | Admit: 2012-04-28 | Discharge: 2012-04-28 | Disposition: A | Discharge status: Home / Self Care | Payer: 59 | Source: Ambulatory Visit | Attending: Internal Medicine | Admitting: Internal Medicine

## 2012-04-28 VITALS — BP 124/80 | HR 80 | Temp 99.6°F | Resp 16 | Wt 212.0 lb

## 2012-04-28 DIAGNOSIS — R059 Cough, unspecified: Secondary | ICD-10-CM

## 2012-04-28 DIAGNOSIS — R05 Cough: Secondary | ICD-10-CM

## 2012-04-28 DIAGNOSIS — J209 Acute bronchitis, unspecified: Secondary | ICD-10-CM

## 2012-04-28 MED ORDER — AZITHROMYCIN 500 MG PO TABS: 500.0000 mg | ORAL_TABLET | Freq: Every day | ORAL | Status: DC

## 2012-04-28 MED ORDER — HYDROCOD POLST-CPM POLST ER 10-8 MG PO CP12: 1.0000 | ORAL_CAPSULE | Freq: Two times a day (BID) | ORAL | Status: DC | PRN

## 2012-04-28 NOTE — Patient Instructions (Signed)

## 2012-04-29 NOTE — Progress Notes (Signed)
  Subjective:    Patient ID: Betty Bell, female    DOB: 07-Jan-1961, 51 y.o.   MRN: [id number]  Cough This is a new problem. The current episode started in the past 7 days. The problem has been unchanged. The problem occurs every few hours. The cough is productive of purulent sputum. Associated symptoms include chills, a fever and a sore throat. Pertinent negatives include no chest pain, ear congestion, ear pain, headaches, heartburn, hemoptysis, myalgias, nasal congestion, postnasal drip, rash, rhinorrhea, shortness of breath, sweats, weight loss or wheezing. She has tried OTC cough suppressant for the symptoms. The treatment provided mild relief. Her past medical history is significant for environmental allergies.      Review of Systems  Constitutional: Positive for fever and chills. Negative for weight loss, diaphoresis, activity change, appetite change, fatigue and unexpected weight change.  HENT: Positive for sore throat. Negative for ear pain, facial swelling, rhinorrhea, sneezing, trouble swallowing, neck pain, dental problem, voice change, postnasal drip and sinus pressure.   Eyes: Negative.   Respiratory: Positive for cough. Negative for hemoptysis, shortness of breath, wheezing and stridor.   Cardiovascular: Negative for chest pain.  Gastrointestinal: Negative for heartburn, nausea, vomiting, abdominal pain, diarrhea, constipation and blood in stool.  Genitourinary: Negative.   Musculoskeletal: Negative for myalgias, back pain, joint swelling, arthralgias and gait problem.  Skin: Negative for color change, pallor, rash and wound.  Neurological: Negative.  Negative for headaches.  Hematological: Positive for environmental allergies. Negative for adenopathy. Does not bruise/bleed easily.  Psychiatric/Behavioral: Negative.        Objective:   Physical Exam  Vitals reviewed. Constitutional: She is oriented to person, place, and time. She appears well-developed and  well-nourished.  Non-toxic appearance. She does not have a sickly appearance. She does not appear ill. No distress.  HENT:  Head: Normocephalic and atraumatic.  Mouth/Throat: Oropharynx is clear and moist. No oropharyngeal exudate.  Eyes: Conjunctivae normal are normal. Right eye exhibits no discharge. Left eye exhibits no discharge. No scleral icterus.  Neck: Normal range of motion. Neck supple. No JVD present. No tracheal deviation present. No thyromegaly present.  Cardiovascular: Normal rate, regular rhythm, normal heart sounds and intact distal pulses.  Exam reveals no gallop and no friction rub.   No murmur heard. Pulmonary/Chest: Effort normal and breath sounds normal. No stridor. No respiratory distress. She has no wheezes. She has no rales. She exhibits no tenderness.  Abdominal: Soft. Bowel sounds are normal. She exhibits no distension and no mass. There is no tenderness. There is no rebound and no guarding.  Musculoskeletal: Normal range of motion. She exhibits no edema and no tenderness.  Lymphadenopathy:    She has no cervical adenopathy.  Neurological: She is oriented to person, place, and time.  Skin: Skin is warm and dry. No rash noted. She is not diaphoretic. No erythema. No pallor.  Psychiatric: She has a normal mood and affect. Her behavior is normal. Judgment and thought content normal.          Assessment & Plan:

## 2012-04-29 NOTE — Assessment & Plan Note (Signed)
I will check a CXR to see if she has pneumonia

## 2012-04-29 NOTE — Assessment & Plan Note (Signed)
Start antibiotics and a cough suppressant

## 2012-06-03 ENCOUNTER — Ambulatory Visit: Payer: 59 | Admitting: Gynecology

## 2012-08-19 ENCOUNTER — Encounter: Payer: Self-pay | Admitting: Gastroenterology

## 2012-08-24 ENCOUNTER — Encounter: Payer: Self-pay | Admitting: Internal Medicine

## 2012-08-24 ENCOUNTER — Ambulatory Visit (INDEPENDENT_AMBULATORY_CARE_PROVIDER_SITE_OTHER)
Admission: RE | Admit: 2012-08-24 | Discharge: 2012-08-24 | Disposition: A | Discharge status: Home / Self Care | Payer: 59 | Source: Ambulatory Visit | Attending: Internal Medicine | Admitting: Internal Medicine

## 2012-08-24 ENCOUNTER — Ambulatory Visit (INDEPENDENT_AMBULATORY_CARE_PROVIDER_SITE_OTHER): Payer: 59 | Admitting: Internal Medicine

## 2012-08-24 VITALS — BP 130/82 | HR 78 | Temp 97.1°F | Resp 16 | Wt 213.2 lb

## 2012-08-24 DIAGNOSIS — IMO0002 Reserved for concepts with insufficient information to code with codable children: Secondary | ICD-10-CM

## 2012-08-24 DIAGNOSIS — IMO0001 Reserved for inherently not codable concepts without codable children: Secondary | ICD-10-CM

## 2012-08-24 DIAGNOSIS — S93401S Sprain of unspecified ligament of right ankle, sequela: Secondary | ICD-10-CM

## 2012-08-24 DIAGNOSIS — S93409A Sprain of unspecified ligament of unspecified ankle, initial encounter: Secondary | ICD-10-CM | POA: Insufficient documentation

## 2012-08-24 DIAGNOSIS — S99911S Unspecified injury of right ankle, sequela: Secondary | ICD-10-CM

## 2012-08-24 DIAGNOSIS — S99919A Unspecified injury of unspecified ankle, initial encounter: Secondary | ICD-10-CM | POA: Insufficient documentation

## 2012-08-24 NOTE — Patient Instructions (Signed)
Ankle Sprain You have a sprained ankle. When you twist or sprain your ankle, the ligaments that hold the joint together are injured. This usually causes a lot of swelling and pain. Although these injuries can be quite severe, proper treatment will reduce your pain, shorten the period of disability, and help prevent re-injury. To treat a sprained ankle you should:  Elevate your ankle for the next 2 to 4 days.   During this period apply ice packs to the injury for 20 to 30 minutes every 2 to 3 hours.   Keep the ankle wrapped in a compression bandage or splint as long as it is painful or swollen.   Do not walk on your ankle if it still hurts. This can slow the healing. Gentle range of motion exercises, however, can help decrease disability.   Use crutches if necessary until weight bearing is painless.   Prescription pain medicine may be needed to relieve discomfort.  A plaster or fiberglass splint may be applied initially. As your sprain improves, air, foam or gel-lined braces can be used to protect the ankle from further injury until the joint is completely healed. Ankle rehabilitation exercises may also be used to speed your recovery and make the joint more stable. Most moderate ankle sprains will heal completely in 6 weeks. However, if the sprain is severe, a cast or even surgery may be needed. Restrict your activities and see your doctor for follow-up as advised. If you have persistent pain, further evaluation and x-rays may be needed. Document Released: 06/04/2004 Document Revised: 04/16/2011 Document Reviewed: 04/28/2008 [employer] Patient Information 2012 [employer]. 

## 2012-08-25 ENCOUNTER — Telehealth: Payer: Self-pay

## 2012-08-25 NOTE — Telephone Encounter (Signed)
Pt notified of normal ankle xray

## 2012-08-25 NOTE — Telephone Encounter (Signed)
Message copied by Noreene Larsson on Thu Aug 25, 2012 11:39 AM ------      Message from: Etta Grandchild      Created: Thu Aug 25, 2012  7:51 AM       Georgiann Hahn- please tell her that the ankle xray was normal ------

## 2012-08-26 ENCOUNTER — Encounter: Payer: Self-pay | Admitting: Internal Medicine

## 2012-08-26 NOTE — Assessment & Plan Note (Signed)
Plain film is normal She can't take nsaids She will continue tylenol as needed I have asked her to start PT

## 2012-08-26 NOTE — Progress Notes (Signed)
  Subjective:    Patient ID: Betty Bell, female    DOB: 04-13-1961, 52 y.o.   MRN: [id number]  Ankle Injury  Incident onset: 2 months ago. The incident occurred at home. The injury mechanism was an inversion injury. The pain is present in the right ankle. The quality of the pain is described as aching. The pain is at a severity of 2/10. The pain is mild. The pain has been intermittent since onset. Pertinent negatives include no inability to bear weight, loss of motion, loss of sensation, muscle weakness, numbness or tingling. She reports no foreign bodies present. The symptoms are aggravated by weight bearing. She has tried acetaminophen for the symptoms. The treatment provided significant relief.      Review of Systems  Constitutional: Negative.   HENT: Negative.   Eyes: Negative.   Respiratory: Negative.   Cardiovascular: Negative.   Gastrointestinal: Negative.   Endocrine: Negative.   Genitourinary: Negative.   Musculoskeletal: Positive for arthralgias. Negative for myalgias, back pain, joint swelling and gait problem.  Skin: Negative.   Allergic/Immunologic: Negative.   Neurological: Negative.  Negative for tingling and numbness.  Hematological: Negative.   Psychiatric/Behavioral: Negative.        Objective:   Physical Exam  Musculoskeletal:       Right knee: Normal.       Right ankle: She exhibits normal range of motion, no swelling, no ecchymosis, no deformity, no laceration and normal pulse. Tenderness. Lateral malleolus tenderness found. No medial malleolus, no AITFL, no CF ligament, no posterior TFL, no head of 5th metatarsal and no proximal fibula tenderness found. Achilles tendon normal. Achilles tendon exhibits no pain, no defect and normal Thompson's test results.       Right lower leg: Normal. She exhibits no tenderness, no bony tenderness, no swelling, no edema, no deformity and no laceration.       Right foot: Normal. She exhibits normal range of motion, no  tenderness, no bony tenderness, no swelling, normal capillary refill, no crepitus, no deformity and no laceration.          Assessment & Plan:

## 2012-11-10 ENCOUNTER — Encounter: Payer: Self-pay | Admitting: Internal Medicine

## 2012-11-10 ENCOUNTER — Ambulatory Visit (INDEPENDENT_AMBULATORY_CARE_PROVIDER_SITE_OTHER): Payer: 59 | Admitting: Internal Medicine

## 2012-11-10 VITALS — BP 142/86 | HR 79 | Temp 98.7°F | Resp 16 | Wt 210.0 lb

## 2012-11-10 DIAGNOSIS — A499 Bacterial infection, unspecified: Secondary | ICD-10-CM

## 2012-11-10 DIAGNOSIS — B9689 Other specified bacterial agents as the cause of diseases classified elsewhere: Secondary | ICD-10-CM

## 2012-11-10 DIAGNOSIS — J329 Chronic sinusitis, unspecified: Secondary | ICD-10-CM

## 2012-11-10 MED ORDER — HYDROCODONE-HOMATROPINE 5-1.5 MG/5ML PO SYRP: 5.0000 mL | ORAL_SOLUTION | Freq: Three times a day (TID) | ORAL | Status: DC | PRN

## 2012-11-10 MED ORDER — AZITHROMYCIN 500 MG PO TABS: 500.0000 mg | ORAL_TABLET | Freq: Every day | ORAL | Status: DC

## 2012-11-10 NOTE — Patient Instructions (Signed)

## 2012-11-10 NOTE — Progress Notes (Signed)
Subjective:    Patient ID: Betty Bell, female    DOB: [birth year], 52 y.o.   MRN: [id number]  Sinusitis This is a new problem. The current episode started in the past 7 days. The problem has been gradually worsening since onset. The maximum temperature recorded prior to her arrival was 100 - 100.9 F. The fever has been present for less than 1 day. Her pain is at a severity of 3/10. The pain is mild. Associated symptoms include chills, sinus pressure, sneezing and a sore throat. Pertinent negatives include no congestion, coughing, diaphoresis, ear pain, headaches, hoarse voice, neck pain, shortness of breath or swollen glands. Past treatments include nothing. The treatment provided no relief.      Review of Systems  Constitutional: Positive for chills. Negative for fever, diaphoresis, activity change, appetite change, fatigue and unexpected weight change.  HENT: Positive for sore throat, rhinorrhea, sneezing and sinus pressure. Negative for ear pain, nosebleeds, congestion, hoarse voice, facial swelling, trouble swallowing, neck pain, neck stiffness, dental problem and postnasal drip.   Eyes: Negative.   Respiratory: Negative.  Negative for cough, chest tightness, shortness of breath, wheezing and stridor.   Cardiovascular: Negative.  Negative for chest pain and palpitations.  Gastrointestinal: Negative.   Endocrine: Negative.   Genitourinary: Negative.   Musculoskeletal: Negative.   Skin: Negative.   Allergic/Immunologic: Negative.   Neurological: Negative.  Negative for dizziness and headaches.  Hematological: Negative.  Negative for adenopathy. Does not bruise/bleed easily.  Psychiatric/Behavioral: Negative.        Objective:   Physical Exam  Constitutional: She is oriented to person, place, and time. She appears well-developed and well-nourished. She does not have a sickly appearance. She does not appear ill. No distress.  HENT:  Head: Normocephalic and atraumatic. No trismus  in the jaw.  Right Ear: Hearing, tympanic membrane, external ear and ear canal normal.  Left Ear: Hearing, tympanic membrane, external ear and ear canal normal.  Nose: No mucosal edema, rhinorrhea or sinus tenderness. No epistaxis. Right sinus exhibits maxillary sinus tenderness. Right sinus exhibits no frontal sinus tenderness. Left sinus exhibits maxillary sinus tenderness. Left sinus exhibits no frontal sinus tenderness.  Mouth/Throat: Mucous membranes are normal. Mucous membranes are not pale, not dry and not cyanotic. No oral lesions. Posterior oropharyngeal erythema present. No oropharyngeal exudate, posterior oropharyngeal edema or tonsillar abscesses.  Eyes: Conjunctivae are normal. Right eye exhibits no discharge. Left eye exhibits no discharge. No scleral icterus.  Neck: Normal range of motion. Neck supple. No JVD present. No tracheal deviation present. No thyromegaly present.  Cardiovascular: Normal rate, regular rhythm, normal heart sounds and intact distal pulses.  Exam reveals no gallop and no friction rub.   No murmur heard. Pulmonary/Chest: Effort normal and breath sounds normal. No stridor. No respiratory distress. She has no wheezes. She has no rales. She exhibits no tenderness.  Abdominal: Soft. Bowel sounds are normal. She exhibits no distension and no mass. There is no tenderness. There is no rebound and no guarding.  Musculoskeletal: Normal range of motion. She exhibits no edema and no tenderness.  Lymphadenopathy:    She has no cervical adenopathy.  Neurological: She is oriented to person, place, and time.  Skin: Skin is warm and dry. No rash noted. She is not diaphoretic. No erythema. No pallor.  Psychiatric: She has a normal mood and affect. Her behavior is normal. Judgment and thought content normal.      Lab Results  Component Value Date   WBC 8.6 03/30/2011  HGB 12.7 03/30/2011   HCT 37.7 03/30/2011   PLT 292.0 03/30/2011   GLUCOSE 89 03/30/2011   CHOL 179  03/30/2011   TRIG 89.0 03/30/2011   HDL 55.80 03/30/2011   LDLCALC 105* 03/30/2011   ALT 17 03/30/2011   AST 20 03/30/2011   NA 139 03/30/2011   K 4.1 03/30/2011   CL 104 03/30/2011   CREATININE 0.7 03/30/2011   BUN 11 03/30/2011   CO2 27 03/30/2011   TSH 0.805 07/16/2011      Assessment & Plan:

## 2012-11-13 NOTE — Assessment & Plan Note (Signed)
Will treat the infection Zpak Will control the symptoms with hycodan

## 2013-02-01 ENCOUNTER — Telehealth: Payer: Self-pay | Admitting: Gastroenterology

## 2013-02-01 NOTE — Telephone Encounter (Signed)
Is this ok with you Dr Christella Hartigan?

## 2013-02-01 NOTE — Telephone Encounter (Signed)
Is this ok with you Dr Brodie? 

## 2013-02-01 NOTE — Telephone Encounter (Signed)
OK with me.

## 2013-02-01 NOTE — Telephone Encounter (Signed)
That is ok with me if OK with Dr. Juanda Chance

## 2013-02-06 NOTE — Telephone Encounter (Signed)
Pt is scheduled to see Dr. Donnajean Lopes on 04-04-13.

## 2013-02-14 ENCOUNTER — Encounter: Payer: Self-pay | Admitting: *Deleted

## 2013-03-06 ENCOUNTER — Encounter: Payer: Self-pay | Admitting: Gynecology

## 2013-03-06 ENCOUNTER — Other Ambulatory Visit (HOSPITAL_COMMUNITY)
Admission: RE | Admit: 2013-03-06 | Discharge: 2013-03-06 | Disposition: A | Discharge status: Home / Self Care | Payer: 59 | Source: Ambulatory Visit | Attending: Gynecology | Admitting: Gynecology

## 2013-03-06 ENCOUNTER — Ambulatory Visit (INDEPENDENT_AMBULATORY_CARE_PROVIDER_SITE_OTHER): Payer: 59 | Admitting: Gynecology

## 2013-03-06 VITALS — BP 134/88 | Ht 64.0 in | Wt 209.0 lb

## 2013-03-06 DIAGNOSIS — Z01419 Encounter for gynecological examination (general) (routine) without abnormal findings: Secondary | ICD-10-CM

## 2013-03-06 DIAGNOSIS — N951 Menopausal and female climacteric states: Secondary | ICD-10-CM

## 2013-03-06 DIAGNOSIS — Z87410 Personal history of cervical dysplasia: Secondary | ICD-10-CM

## 2013-03-06 DIAGNOSIS — D259 Leiomyoma of uterus, unspecified: Secondary | ICD-10-CM

## 2013-03-06 DIAGNOSIS — Z23 Encounter for immunization: Secondary | ICD-10-CM

## 2013-03-06 DIAGNOSIS — Z800 Family history of malignant neoplasm of digestive organs: Secondary | ICD-10-CM | POA: Insufficient documentation

## 2013-03-06 DIAGNOSIS — N7011 Chronic salpingitis: Secondary | ICD-10-CM | POA: Insufficient documentation

## 2013-03-06 NOTE — Patient Instructions (Addendum)
Influenza Vaccine (Flu Vaccine, Inactivated) 2013 2014 What You Need to Know WHY GET VACCINATED?  Influenza ("flu") is a contagious disease that spreads around the [place] every winter, usually between October and May.  Flu is caused by the influenza virus, and can be spread by coughing, sneezing, and close contact.  Anyone can get flu, but the risk of getting flu is highest among children. Symptoms come on suddenly and may last several days. They can include:  Fever or chills.  Sore throat.  Muscle aches.  Fatigue.  Cough.  Headache.  Runny or stuffy nose. Flu can make some people much sicker than others. These people include young children, people 65 and older, pregnant women, and people with certain health conditions such as heart, lung or kidney disease, or a weakened immune system. Flu vaccine is especially important for these people, and anyone in close contact with them. Flu can also lead to pneumonia, and make existing medical conditions worse. It can cause diarrhea and seizures in children. Each year thousands of people in the [place] die from flu, and many more are hospitalized. Flu vaccine is the best protection we have from flu and its complications. Flu vaccine also helps prevent spreading flu from person to person. INACTIVATED FLU VACCINE There are 2 types of influenza vaccine:  You are getting an inactivated flu vaccine, which does not contain any live influenza virus. It is given by injection with a needle, and often called the "flu shot."  A different live, attenuated (weakened) influenza vaccine is sprayed into the nostrils. This vaccine is described in a separate Vaccine Information Statement. Flu vaccine is recommended every year. Children 6 months through 8 years of age should get 2 doses the first year they get vaccinated. Flu viruses are always changing. Each year's flu vaccine is made to protect from viruses that are most likely to cause disease  that year. While flu vaccine cannot prevent all cases of flu, it is our best defense against the disease. Inactivated flu vaccine protects against 3 or 4 different influenza viruses. It takes about 2 weeks for protection to develop after the vaccination, and protection lasts several months to a year. Some illnesses that are not caused by influenza virus are often mistaken for flu. Flu vaccine will not prevent these illnesses. It can only prevent influenza. A "high-dose" flu vaccine is available for people 65 years of age and older. The person giving you the vaccine can tell you more about it. Some inactivated flu vaccine contains a very small amount of a mercury-based preservative called thimerosal. Studies have shown that thimerosal in vaccines is not harmful, but flu vaccines that do not contain a preservative are available. SOME PEOPLE SHOULD NOT GET THIS VACCINE Tell the person who gives you the vaccine:  If you have any severe (life-threatening) allergies. If you ever had a life-threatening allergic reaction after a dose of flu vaccine, or have a severe allergy to any part of this vaccine, you may be advised not to get a dose. Most, but not all, types of flu vaccine contain a small amount of egg.  If you ever had Guillain Barr Syndrome (a severe paralyzing illness, also called GBS). Some people with a history of GBS should not get this vaccine. This should be discussed with your doctor.  If you are not feeling well. They might suggest waiting until you feel better. But you should come back. RISKS OF A VACCINE REACTION With a vaccine, like any medicine, there   is a chance of side effects. These are usually mild and go away on their own. Serious side effects are also possible, but are very rare. Inactivated flu vaccine does not contain live flu virus, sogetting flu from this vaccine is not possible. Brief fainting spells and related symptoms (such as jerking movements) can happen after any medical  procedure, including vaccination. Sitting or lying down for about 15 minutes after a vaccination can help prevent fainting and injuries caused by falls. Tell your doctor if you feel dizzy or lightheaded, or have vision changes or ringing in the ears. Mild problems following inactivated flu vaccine:  Soreness, redness, or swelling where the shot was given.  Hoarseness; sore, red or itchy eyes; or cough.  Fever.  Aches.  Headache.  Itching.  Fatigue. If these problems occur, they usually begin soon after the shot and last 1 or 2 days. Moderate problems following inactivated flu vaccine:  Young children who get inactivated flu vaccine and pneumococcal vaccine (PCV13) at the same time may be at increased risk for seizures caused by fever. Ask your doctor for more information. Tell your doctor if a child who is getting flu vaccine has ever had a seizure. Severe problems following inactivated flu vaccine:  A severe allergic reaction could occur after any vaccine (estimated less than 1 in a million doses).  There is a small possibility that inactivated flu vaccine could be associated with Guillan Barr Syndrome (GBS), no more than 1 or 2 cases per million people vaccinated. This is much lower than the risk of severe complications from flu, which can be prevented by flu vaccine. The safety of vaccines is always being monitored. For more information, visit: http://floyd.org/ WHAT IF THERE IS A SERIOUS REACTION? What should I look for?  Look for anything that concerns you, such as signs of a severe allergic reaction, very high fever, or behavior changes. Signs of a severe allergic reaction can include hives, swelling of the face and throat, difficulty breathing, a fast heartbeat, dizziness, and weakness. These would start a few minutes to a few hours after the vaccination. What should I do?  If you think it is a severe allergic reaction or other emergency that cannot wait, call [phone] 1 1   or get the person to the nearest hospital. Otherwise, call your doctor.  Afterward, the reaction should be reported to the Vaccine Adverse Event Reporting System (VAERS). Your doctor might file this report, or you can do it yourself through the VAERS website at www.vaers.LAgents.no, or by calling 1-419-588-1704. VAERS is only for reporting reactions. They do not give medical advice. THE NATIONAL VACCINE INJURY COMPENSATION PROGRAM The National Vaccine Injury Compensation Program (VICP) is a federal program that was created to compensate people who may have been injured by certain vaccines. Persons who believe they may have been injured by a vaccine can learn about the program and about filing a claim by calling 1-458 489 4641 or visiting the VICP website at SpiritualWord.at HOW CAN I LEARN MORE?  Ask your doctor.  Call your local or state health department.  Contact the Centers for Disease Control and Prevention (CDC):  Call 626-633-9548 (1-800-CDC-INFO) or  Visit CDC's website at BiotechRoom.com.cy CDC Inactivated Influenza Vaccine Interim VIS (12/04/11) Document Released: 02/19/2006 Document Revised: 01/20/2012 Document Reviewed: 12/04/2011 [facility] Patient Information 2014 [place], [place]. Hormone Therapy At menopause, your body begins making less estrogen and progesterone hormones. This causes the body to stop having menstrual periods. This is because estrogen and progesterone hormones control your periods  and menstrual cycle. A lack of estrogen may cause symptoms such as:  Hot flushes (or hot flashes).  Vaginal dryness.  Dry skin.  Loss of sex drive.  Risk of bone loss (osteoporosis). When this happens, you may choose to take hormone therapy to get back the estrogen lost during menopause. When the hormone estrogen is given alone, it is usually referred to as ET (Estrogen Therapy). When the hormone progestin is combined with estrogen, it is generally called HT (Hormone  Therapy). This was formerly known as hormone replacement therapy (HRT). Your caregiver can help you make a decision on what will be best for you. The decision to use HT seems to change often as new studies are done. Many studies do not agree on the benefits of hormone replacement therapy. LIKELY BENEFITS OF HT INCLUDE PROTECTION FROM:  Hot Flushes (also called hot flashes) - A hot flush is a sudden feeling of heat that spreads over the face and body. The skin may redden like a blush. It is connected with sweats and sleep disturbance. Women going through menopause may have hot flushes a few times a month or several times per day depending on the woman.  Osteoporosis (bone loss)- Estrogen helps guard against bone loss. After menopause, a woman's bones slowly lose calcium and become weak and brittle. As a result, bones are more likely to break. The hip, wrist, and spine are affected most often. Hormone therapy can help slow bone loss after menopause. Weight bearing exercise and taking calcium with vitamin D also can help prevent bone loss. There are also medications that your caregiver can prescribe that can help prevent osteoporosis.  Vaginal Dryness - Loss of estrogen causes changes in the vagina. Its lining may become thin and dry. These changes can cause pain and bleeding during sexual intercourse. Dryness can also lead to infections. This can cause burning and itching. (Vaginal estrogen treatment can help relieve pain, itching, and dryness.)  Urinary Tract Infections are more common after menopause because of lack of estrogen. Some women also develop urinary incontinence because of low estrogen levels in the vagina and bladder.  Possible other benefits of estrogen include a positive effect on mood and short-term memory in women. RISKS AND COMPLICATIONS  Using estrogen alone without progesterone causes the lining of the uterus to grow. This increases the risk of lining of the uterus (endometrial)  cancer. Your caregiver should give another hormone called progestin if you have a uterus.  Women who take combined (estrogen and progestin) HT appear to have an increased risk of breast cancer. The risk appears to be small, but increases throughout the time that HT is taken.  Combined therapy also makes the breast tissue slightly denser which makes it harder to read mammograms (breast X-rays).  Combined, estrogen and progesterone therapy can be taken together every day, in which case there may be spotting of blood. HT therapy can be taken cyclically in which case you will have menstrual periods. Cyclically means HT is taken for a set amount of days, then not taken, then this process is repeated.  HT may increase the risk of stroke, heart attack, breast cancer and forming blood clots in your leg.  Transdermal estrogen (estrogen that is absorbed through the skin with a patch or a cream) may have more positive results with:  Cholesterol.  Blood pressure.  Blood clots. Having the following conditions may indicate you should not have HT:  Endometrial cancer.  Liver disease.  Breast cancer.  Heart disease.  History of blood clots.  Stroke. TREATMENT   If you choose to take HT and have a uterus, usually estrogen and progestin are prescribed.  Your caregiver will help you decide the best way to take the medications.  Possible ways to take estrogen include:  Pills.  Patches.  Gels.  Sprays.  Vaginal estrogen cream, rings and tablets.  It is best to take the lowest dose possible that will help your symptoms and take them for the shortest period of time that you can.  Hormone therapy can help relieve some of the problems (symptoms) that affect women at menopause. Before making a decision about HT, talk to your caregiver about what is best for you. Be well informed and comfortable with your decisions. HOME CARE INSTRUCTIONS   Follow your caregivers advice when taking the  medications.  A Pap test is done to screen for cervical cancer.  The first Pap test should be done at age 75.  Between ages 58 and 62, Pap tests are repeated every 2 years.  Beginning at age 25, you are advised to have a Pap test every 3 years as long as your past 3 Pap tests have been normal.  Some women have medical problems that increase the chance of getting cervical cancer. Talk to your caregiver about these problems. It is especially important to talk to your caregiver if a new problem develops soon after your last Pap test. In these cases, your caregiver may recommend more frequent screening and Pap tests.  The above recommendations are the same for women who have or have not gotten the vaccine for HPV (Human Papillomavirus).  If you had a hysterectomy for a problem that was not a cancer or a condition that could lead to cancer, then you no longer need Pap tests. However, even if you no longer need a Pap test, a regular exam is a good idea to make sure no other problems are starting.   If you are between ages 58 and 24, and you have had normal Pap tests going back 10 years, you no longer need Pap tests. However, even if you no longer need a Pap test, a regular exam is a good idea to make sure no other problems are starting.   If you have had past treatment for cervical cancer or a condition that could lead to cancer, you need Pap tests and screening for cancer for at least 20 years after your treatment.  If Pap tests have been discontinued, risk factors (such as a new sexual partner) need to be re-assessed to determine if screening should be resumed.  Some women may need screenings more often if they are at high risk for cervical cancer.  Get mammograms done as per the advice of your caregiver. SEEK IMMEDIATE MEDICAL CARE IF:  You develop abnormal vaginal bleeding.  You have pain or swelling in your legs, shortness of breath, or chest pain.  You develop dizziness or  headaches.  You have lumps or changes in your breasts or armpits.  You have slurred speech.  You develop weakness or numbness of your arms or legs.  You have pain, burning, or bleeding when urinating.  You develop abdominal pain. Document Released: 01/24/2003 Document Revised: 07/20/2011 Document Reviewed: 05/14/2010 [facility]  Patient Information 2014 [place], [place]. Menopause Menopause is the normal time of life when menstrual periods stop completely. Menopause is complete when you have missed 12 consecutive menstrual periods. It usually occurs between the ages of 44 to 73, with an average  age of 76. Very rarely does a woman develop menopause before 53 years old. At menopause, your ovaries stop producing the female hormones, estrogen and progesterone. This can cause undesirable symptoms and also affect your health. Sometimes the symptoms may occur 4 to 5 years before the menopause begins. There is no relationship between menopause and:  Oral contraceptives.  Number of children you had.  Race.  The age your menstrual periods started (menarche). Heavy smokers and very thin women may develop menopause earlier in life. CAUSES  The ovaries stop producing the female hormones estrogen and progesterone.  Other causes include:  Surgery to remove both ovaries.  The ovaries stop functioning for no known reason.  Tumors of the pituitary gland in the brain.  Medical disease that affects the ovaries and hormone production.  Radiation treatment to the abdomen or pelvis.  Chemotherapy that affects the ovaries. SYMPTOMS   Hot flashes.  Night sweats.  Decrease in sex drive.  Vaginal dryness and thinning of the vagina causing painful intercourse.  Dryness of the skin and developing wrinkles.  Headaches.  Tiredness.  Irritability.  Memory problems.  Weight gain.  Bladder infections.  Hair growth of the face and chest.  Infertility. More serious symptoms  include:  Loss of bone (osteoporosis) causing breaks (fractures).  Depression.  Hardening and narrowing of the arteries (atherosclerosis) causing heart attacks and strokes. DIAGNOSIS   When the menstrual periods have stopped for 12 straight months.  Physical exam.  Hormone studies of the blood. TREATMENT  There are many treatment choices and nearly as many questions about them. The decisions to treat or not to treat menopausal changes is an individual choice made with your caregiver. Your caregiver can discuss the treatments with you. Together, you can decide which treatment will work best for you. Your treatment choices may include:   Hormone therapy (estorgen and progesterone).  Non-hormonal medications.  Treating the individual symptoms with medication (for example antidepressants for depression).  Herbal medications that may help specific symptoms.  Counseling by a psychiatrist or psychologist.  Group therapy.  Lifestyle changes including:  Eating healthy.  Regular exercise.  Limiting caffeine and alcohol.  Stress management and meditation.  No treatment. HOME CARE INSTRUCTIONS   Take the medication your caregiver gives you as directed.  Get plenty of sleep and rest.  Exercise regularly.  Eat a diet that contains calcium (good for the bones) and soy products (acts like estrogen hormone).  Avoid alcoholic beverages.  Do not smoke.  If you have hot flashes, dress in layers.  Take supplements, calcium and vitamin D to strengthen bones.  You can use over-the-counter lubricants or moisturizers for vaginal dryness.  Group therapy is sometimes very helpful.  Acupuncture may be helpful in some cases. SEEK MEDICAL CARE IF:   You are not sure you are in menopause.  You are having menopausal symptoms and need advice and treatment.  You are still having menstrual periods after age 52.  You have pain with intercourse.  Menopause is complete (no  menstrual period for 12 months) and you develop vaginal bleeding.  You need a referral to a specialist (gynecologist, psychiatrist or psychologist) for treatment. SEEK IMMEDIATE MEDICAL CARE IF:   You have severe depression.  You have excessive vaginal bleeding.  You fell and think you have a broken bone.  You have pain when you urinate.  You develop leg or chest pain.  You have a fast pounding heart beat (palpitations).  You have severe headaches.  You  develop vision problems.  You feel a lump in your breast.  You have abdominal pain or severe indigestion. Document Released: 07/18/2003 Document Revised: 07/20/2011 Document Reviewed: 02/23/2008 [facility] Patient Information 2014 [place], [place].

## 2013-03-06 NOTE — Progress Notes (Signed)
Betty Bell 08-12-1960 [id number]   History:    52 y.o.  for annual gyn exam with complaints of hot flashes at least 6 times a day. Also complaining of low Donald discomfort sometimes mostly on the left lower abdomen. Patient with past history of fibroid uterus had an ultrasound in March of 2013 in which the ultrasound demonstrated that the uterus had enlarged by 5 fibroids. Size is unchanged from previous ultrasound of years ago. Right ovary is normal. Left ovary is normal. She continues to have a hydrosalpinx of her left fallopian tube. Her cul-de-sac is free of fluid.a triangular-shaped fluid-filled mass echogenic negative color flow measuring 29 x 16 mm was described. Patient was offered total abdominal hysterectomy bilateral salpingo-oophorectomy but patient has been adamant at this time. She does suffer from gastroesophageal reflux and feels at some time the abdominal discomfort is a result of this and not her fibroids. Review of her records also indicated the following:  In 2005 patient had colposcopy directed biopsy which demonstrated CIN-1 and CIN-2 and underwent a CO2 laser ablation of cervix. Followup Pap smear December 2011 low-grade squamous intraepithelial lesion no high-risk HPV detected. She underwent colposcopic biopsy in January 2012 demonstrated once again CIN-1. Pap smears within normal.  2009 as a result of the patient's history of dysfunctional uterine bleeding and endometrial polyps she underwent hysteroscopy D&C pathology report confirmed benign endometrial polyps with no evidence of hyperplasia or malignancy.  1995 patient had a D&C for first trimester missed abortion  Patient's last colonoscopy was in 2010 report to be normal. She is on a five-year cycle as a result a family history whereby her father had history of colon cancer. 2013 confirmed the patient was in the menopausal state based on her [facility].  Patient's PCP is Dr. Yetta Barre who has been doing her blood work. Patient is  requesting to have her flu vaccine today. Patient has never been on hormone replacement therapy. The patient's last mammogram was in December 2012.   Past medical history,surgical history, family history and social history were all reviewed and documented in the EPIC chart.  Gynecologic History Patient's last menstrual period was 03/26/2011. Contraception: none Last Pap: 2013. Results were: normal Last mammogram: 2012. Results were: normal  Obstetric History OB History  Gravida Para Term Preterm AB SAB TAB Ectopic Multiple Living  1    1         # Outcome Date GA Lbr Len/2nd Weight Sex Delivery Anes PTL Lv  1 ABT                ROS: A ROS was performed and pertinent positives and negatives are included in the history.  GENERAL: No fevers or chills. HEENT: No change in vision, no earache, sore throat or sinus congestion. NECK: No pain or stiffness. CARDIOVASCULAR: No chest pain or pressure. No palpitations. PULMONARY: No shortness of breath, cough or wheeze. GASTROINTESTINAL: No abdominal pain, nausea, vomiting or diarrhea, melena or bright red blood per rectum. GENITOURINARY: No urinary frequency, urgency, hesitancy or dysuria. MUSCULOSKELETAL: No joint or muscle pain, no back pain, no recent trauma. DERMATOLOGIC: No rash, no itching, no lesions. ENDOCRINE: No polyuria, polydipsia, no heat or cold intolerance. No recent change in weight. HEMATOLOGICAL: No anemia or easy bruising or bleeding. NEUROLOGIC: No headache, seizures, numbness, tingling or weakness. PSYCHIATRIC: No depression, no loss of interest in normal activity or change in sleep pattern.     Exam: chaperone present  BP 134/88  Ht 5\' 4"  (1.626 m)  Wt 209 lb (94.802 kg)  BMI 35.86 kg/m2  LMP 03/26/2011  Body mass index is 35.86 kg/(m^2).  General appearance : Well developed well nourished female. No acute distress HEENT: Neck supple, trachea midline, no carotid bruits, no thyroidmegaly Lungs: Clear to auscultation,  no rhonchi or wheezes, or rib retractions  Heart: Regular rate and rhythm, no murmurs or gallops Breast:Examined in sitting and supine position were symmetrical in appearance, no palpable masses or tenderness,  no skin retraction, no nipple inversion, no nipple discharge, no skin discoloration, no axillary or supraclavicular lymphadenopathy Abdomen: no palpable masses or tenderness, no rebound or guarding Extremities: no edema or skin discoloration or tenderness  Pelvic:  Bartholin, Urethra, Skene Glands: Within normal limits             Vagina: No gross lesions or discharge  Cervix: No gross lesions or discharge  Uterus  Irregular shaped 8 weeks size slightly tender on the left adnexa greater than right but no large masses palpated, normal size, shape and consistency, non-tender and mobile  Adnexa  Without masses or tenderness  Anus and perineum  normal   Rectovaginal  normal sphincter tone without palpated masses or tenderness             Hemoccult card provided     Assessment/Plan:  52 y.o. female for annual exam who is menopausal and now manifesting hot flashes at least 6 times a day. I have provided her with literature and information on the menopause and hormone replacement therapy and we will continue to discuss an answer a question when she returns in the next couple weeks for a sonohysterogram to better assess her intrauterine cavity and to followup on her bilateral hydrosalpinx and fibroids. I believe that her symptoms are treated to these fibroids and the hydrosalpinx and she will reconsider her hysterectomy and I have recommended doing it laparoscopically. Pap smear was done today. Patient received the flu vaccine today. She was given a requisition for to schedule her mammogram. She was provided with Hemoccult cards to submit to the office when she returns for the ultrasound. We discussed the importance of calcium and vitamin D and regular exercise for osteoporosis prevention  Note:  This dictation was prepared with  Dragon/digital dictation along withSmart phrase technology. Any transcriptional errors that result from this process are unintentional.   Ok Edwards MD, 5:13 PM 03/06/2013

## 2013-03-08 ENCOUNTER — Other Ambulatory Visit: Payer: Self-pay | Admitting: Gynecology

## 2013-03-08 DIAGNOSIS — D259 Leiomyoma of uterus, unspecified: Secondary | ICD-10-CM

## 2013-03-08 DIAGNOSIS — Z1231 Encounter for screening mammogram for malignant neoplasm of breast: Secondary | ICD-10-CM

## 2013-03-08 DIAGNOSIS — N950 Postmenopausal bleeding: Secondary | ICD-10-CM

## 2013-03-15 ENCOUNTER — Other Ambulatory Visit: Payer: 59 | Admitting: Anesthesiology

## 2013-03-15 DIAGNOSIS — Z1211 Encounter for screening for malignant neoplasm of colon: Secondary | ICD-10-CM

## 2013-03-21 ENCOUNTER — Ambulatory Visit (HOSPITAL_COMMUNITY): Payer: 59

## 2013-03-22 ENCOUNTER — Other Ambulatory Visit: Payer: 59

## 2013-03-22 ENCOUNTER — Ambulatory Visit: Payer: 59 | Admitting: Gynecology

## 2013-03-30 ENCOUNTER — Ambulatory Visit: Payer: 59 | Admitting: Gynecology

## 2013-03-30 ENCOUNTER — Other Ambulatory Visit: Payer: 59

## 2013-04-04 ENCOUNTER — Encounter: Payer: Self-pay | Admitting: Internal Medicine

## 2013-04-04 ENCOUNTER — Ambulatory Visit (INDEPENDENT_AMBULATORY_CARE_PROVIDER_SITE_OTHER): Payer: 59 | Admitting: Internal Medicine

## 2013-04-04 VITALS — BP 150/80 | HR 74 | Ht 64.0 in | Wt 211.0 lb

## 2013-04-04 DIAGNOSIS — R109 Unspecified abdominal pain: Secondary | ICD-10-CM

## 2013-04-04 DIAGNOSIS — Z1211 Encounter for screening for malignant neoplasm of colon: Secondary | ICD-10-CM

## 2013-04-04 MED ORDER — MOVIPREP 100 G PO SOLR: 1.0000 | Freq: Once | ORAL | Status: DC

## 2013-04-04 MED ORDER — RANITIDINE HCL 300 MG PO TABS: 300.0000 mg | ORAL_TABLET | Freq: Every day | ORAL | Status: DC

## 2013-04-04 MED ORDER — DICYCLOMINE HCL 10 MG PO CAPS: 10.0000 mg | ORAL_CAPSULE | Freq: Two times a day (BID) | ORAL | Status: DC

## 2013-04-04 NOTE — Patient Instructions (Signed)
You have been scheduled for a colonoscopy with propofol. Please follow written instructions given to you at your visit today.  Please pick up your prep kit at the pharmacy within the next 1-3 days. If you use inhalers (even only as needed), please bring them with you on the day of your procedure. Your physician has requested that you go to www.startemmi.com and enter the access code given to you at your visit today. This web site gives a general overview about your procedure. However, you should still follow specific instructions given to you by our office regarding your preparation for the procedure.  We have sent the following medications to your pharmacy for you to pick up at your convenience: Zantac 300 mg every night Bentyl 10 mg twice daily  Please drink 6 ounces of prune juice daily to help with constipation.  Cc: Dr Sanda Linger

## 2013-04-04 NOTE — Progress Notes (Signed)
Betty Bell 1960/09/09 MRN [id number]   History of Present Illness:  This is a 52 year old African American female with crampy abdominal pain which occurs on an almost daily basis, usually after meals. It sometimes starts while she is still eating and lasts for several hours. She denies  diarrhea or constipation. She has had gastroesophageal reflux which was evaluated by Dr. Loreta Ave with an upper endoscopy in February 2010. She had antral gastritis and erosion secondary to NSAID's. She is currently on Zantac150 mg a day. She had a screening colonoscopy in April 2009 by Dr Christella Hartigan.. Because of a positive family history of colon cancer in her brother, she is due for a recall colonoscopy. She is perimenopausal. Her last period was approximately 2 years ago. She has been scheduled for a pelvic ultrasound by Dr. Lily Peer. 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 She works for [employer].   Past Medical History  Diagnosis Date  . GERD (gastroesophageal reflux disease)   . Uterine fibroid   . Hydrosalpinx     Left and Right  . Cervical dysplasia     High Grade  . Endometrial polyp   . CIN I (cervical intraepithelial neoplasia I)   . Seasonal allergies   . Anxiety   . Asthma   . IBS (irritable bowel syndrome)    Past Surgical History  Procedure Laterality Date  . Uterine fibroid embolization    . Pelvic laparoscopy      DL  . Dilation and curettage of uterus    . Hysteroscopy    . Laser ablation of the cervix    . Carpal tunnel release Left     x 2  . Wrist cystectomy Right     reports that she has never smoked. She has never used smokeless tobacco. She reports that she does not drink alcohol or use illicit drugs. family history includes Cancer in her brother; Cancer - Other in her brother; Colon cancer in her brother and father; Colon polyps in her sister; Diabetes in her father and mother; Hypertension in her father, mother, and sister; Irritable bowel syndrome in her  sister; Kidney disease in an other family member; Lung cancer in her brother; Prostate cancer in her father. Allergies  Allergen Reactions  . Aspirin     REACTION: Swelling  . Cephalosporins   . Ciprofloxacin   . Nitrofurantoin Monohyd Macro   . Penicillins   . Sulfa Antibiotics         Review of Systems: Crampy abdominal pain. Denies rectal bleeding positive for weight gain of 20 pounds  The remainder of the 10 point ROS is negative except as outlined in H&P   Physical Exam: General appearance  Well developed, in no distress overweight. Eyes- non icteric. HEENT nontraumatic, normocephalic. Mouth no lesions, tongue papillated, no cheilosis. Neck supple without adenopathy, thyroid not enlarged, no carotid bruits, no JVD. Lungs Clear to auscultation bilaterally. Cor normal S1, normal S2, regular rhythm, no murmur,  quiet precordium. Abdomen: Mild tenderness left lower and middle quadrant without palpable mass. Normal active bowel sounds. No tympany or distention. Liver edge at costal margin. The right lower quadrant is unremarkable. Rectal: Normal perianal area. Soft Hemoccult negative stool in a large amount. Extremities no pedal edema. Skin no lesions. Neurological alert and oriented x 3. Psychological normal Bell and affect.  Assessment and Plan:  Problem #59 52 year old [place] American female with daily episodes of crampy abdominal pain without associated symptoms of bleeding or weight loss. Functional  constipation would be a possibility. Irritable bowel syndrome or symptomatic diverticulosis would be another possibility. Ongoing intra-abdominal process will be evaluated with a pelvic ultrasound depending and  Depending on the results she may need CT scan of the abdomen. We will start her on Bentyl 10 mg twice a day for crampy abdominal pain.  Problem #2 Gastroesophageal reflux disease. Patient needs to increase Zantac to 300 mg at night.  Problem #3 Colorectal cancer  screening. Her last colonoscopy was in April 2009. She is due for a recall colonoscopy. She will be scheduled with a routine prep and propofol sedation. She will start prune juice or an over-the-counter laxative several times a week to improve her bowel habits.   04/04/2013 [name]

## 2013-04-10 ENCOUNTER — Ambulatory Visit (HOSPITAL_COMMUNITY): Payer: 59 | Attending: Gynecology

## 2013-04-10 ENCOUNTER — Encounter: Payer: Self-pay | Admitting: Internal Medicine

## 2013-04-21 ENCOUNTER — Other Ambulatory Visit: Payer: Self-pay | Admitting: Gynecology

## 2013-04-21 ENCOUNTER — Ambulatory Visit (INDEPENDENT_AMBULATORY_CARE_PROVIDER_SITE_OTHER): Payer: 59 | Admitting: Gynecology

## 2013-04-21 ENCOUNTER — Ambulatory Visit (INDEPENDENT_AMBULATORY_CARE_PROVIDER_SITE_OTHER): Payer: 59

## 2013-04-21 DIAGNOSIS — N83339 Acquired atrophy of ovary and fallopian tube, unspecified side: Secondary | ICD-10-CM

## 2013-04-21 DIAGNOSIS — N950 Postmenopausal bleeding: Secondary | ICD-10-CM

## 2013-04-21 DIAGNOSIS — D259 Leiomyoma of uterus, unspecified: Secondary | ICD-10-CM

## 2013-04-21 DIAGNOSIS — N7013 Chronic salpingitis and oophoritis: Secondary | ICD-10-CM

## 2013-04-21 DIAGNOSIS — N7011 Chronic salpingitis: Secondary | ICD-10-CM

## 2013-04-21 DIAGNOSIS — N949 Unspecified condition associated with female genital organs and menstrual cycle: Secondary | ICD-10-CM

## 2013-04-21 DIAGNOSIS — R141 Gas pain: Secondary | ICD-10-CM

## 2013-04-21 DIAGNOSIS — N852 Hypertrophy of uterus: Secondary | ICD-10-CM

## 2013-04-21 NOTE — Progress Notes (Signed)
52 year old who presented to the office today for followup due to chief has had a history of fibroids as well as a history of a right hydrosalpinx. Patient was seen in the office for her annual gynecological exam on 03/06/2013 see previous encounter for additional information. Her Pap smear this year was normal. Patient has had vasomotor symptoms but states that now they have gotten better she is not interested in hormone replacement therapy at the present time. She is sexually active and does use lubricant on a when necessary basis. She is not having any pelvic pain or any vaginal bleeding.  Ultrasound today: Uterus measures 7.3 x 5.5 x 3.5 cm endometrial stripe 3.2 mm. Patient had 5 small fibroids the largest one measuring 27 x 24 mm. Left ovary normal with a small 7 mm cyst right ovary normal. A small 5 mm cyst was noted and a right hydrosalpinx 39 x 21 mm which has not changed from last year.  Assessment/plan: Postmenopausal patient on no hormone replacement therapy with no vasomotor symptoms. History of fibroid uterus small unchanged and with thin endometrial stripe. Stable unchanged right hydrosalpinx. Patient was reassured. Patient will return back next year for her annual exam or when necessary. Of note she had previously been provided literature and information on hormone replacement therapy in Spanish.

## 2013-05-02 ENCOUNTER — Encounter: Payer: Self-pay | Admitting: Internal Medicine

## 2013-05-02 ENCOUNTER — Ambulatory Visit (INDEPENDENT_AMBULATORY_CARE_PROVIDER_SITE_OTHER): Payer: 59 | Admitting: Internal Medicine

## 2013-05-02 VITALS — BP 138/92 | HR 87 | Temp 98.2°F | Resp 16 | Ht 64.0 in | Wt 208.0 lb

## 2013-05-02 DIAGNOSIS — J209 Acute bronchitis, unspecified: Secondary | ICD-10-CM

## 2013-05-02 MED ORDER — AZITHROMYCIN 500 MG PO TABS: 500.0000 mg | ORAL_TABLET | Freq: Every day | ORAL | Status: DC

## 2013-05-02 NOTE — Progress Notes (Signed)
Pre visit review using our clinic review tool, if applicable. No additional management support is needed unless otherwise documented below in the visit note. 

## 2013-05-02 NOTE — Patient Instructions (Signed)
Acute Bronchitis Bronchitis is inflammation of the airways that extend from the windpipe into the lungs (bronchi). The inflammation often causes mucus to develop. This leads to a cough, which is the most common symptom of bronchitis.  In acute bronchitis, the condition usually develops suddenly and goes away over time, usually in a couple weeks. Smoking, allergies, and asthma can make bronchitis worse. Repeated episodes of bronchitis may cause further lung problems.  CAUSES Acute bronchitis is most often caused by the same virus that causes a cold. The virus can spread from person to person (contagious).  SIGNS AND SYMPTOMS   Cough.   Fever.   Coughing up mucus.   Body aches.   Chest congestion.   Chills.   Shortness of breath.   Sore throat.  DIAGNOSIS  Acute bronchitis is usually diagnosed through a physical exam. Tests, such as chest X-rays, are sometimes done to rule out other conditions.  TREATMENT  Acute bronchitis usually goes away in a couple weeks. Often times, no medical treatment is necessary. Medicines are sometimes given for relief of fever or cough. Antibiotics are usually not needed but may be prescribed in certain situations. In some cases, an inhaler may be recommended to help reduce shortness of breath and control the cough. A cool mist vaporizer may also be used to help thin bronchial secretions and make it easier to clear the chest.  HOME CARE INSTRUCTIONS  Get plenty of rest.   Drink enough fluids to keep your urine clear or pale yellow (unless you have a medical condition that requires fluid restriction). Increasing fluids may help thin your secretions and will prevent dehydration.   Only take over-the-counter or prescription medicines as directed by your health care provider.   Avoid smoking and secondhand smoke. Exposure to cigarette smoke or irritating chemicals will make bronchitis worse. If you are a smoker, consider using nicotine gum or skin  patches to help control withdrawal symptoms. Quitting smoking will help your lungs heal faster.   Reduce the chances of another bout of acute bronchitis by washing your hands frequently, avoiding people with cold symptoms, and trying not to touch your hands to your mouth, nose, or eyes.   Follow up with your health care provider as directed.  SEEK MEDICAL CARE IF: Your symptoms do not improve after 1 week of treatment.  SEEK IMMEDIATE MEDICAL CARE IF:  You develop an increased fever or chills.   You have chest pain.   You have severe shortness of breath.  You have bloody sputum.   You develop dehydration.  You develop fainting.  You develop repeated vomiting.  You develop a severe headache. MAKE SURE YOU:   Understand these instructions.  Will watch your condition.  Will get help right away if you are not doing well or get worse. Document Released: 06/04/2004 Document Revised: 12/28/2012 Document Reviewed: 10/18/2012 Exit[employer] Patient Information 2014 [employer].  

## 2013-05-02 NOTE — Progress Notes (Signed)
Subjective:    Patient ID: Betty Bell, female    DOB: 10-Jul-1960, 52 y.o.   MRN: [id number]  Cough This is a new problem. The current episode started in the past 7 days. The problem has been unchanged. The problem occurs every few hours. The cough is productive of purulent sputum. Associated symptoms include chills and a sore throat. Pertinent negatives include no chest pain, ear congestion, ear pain, fever, headaches, heartburn, hemoptysis, myalgias, nasal congestion, postnasal drip, rash, rhinorrhea, shortness of breath, sweats, weight loss or wheezing. She has tried prescription cough suppressant for the symptoms. The treatment provided moderate relief.      Review of Systems  Constitutional: Positive for chills. Negative for fever, weight loss, diaphoresis, activity change, appetite change and fatigue.  HENT: Positive for congestion and sore throat. Negative for ear pain, nosebleeds, postnasal drip, rhinorrhea, sinus pressure, sneezing, tinnitus, trouble swallowing and voice change.   Eyes: Negative.   Respiratory: Positive for cough. Negative for apnea, hemoptysis, choking, chest tightness, shortness of breath, wheezing and stridor.   Cardiovascular: Negative.  Negative for chest pain, palpitations and leg swelling.  Gastrointestinal: Negative.  Negative for heartburn and abdominal pain.  Endocrine: Negative.   Genitourinary: Negative.   Musculoskeletal: Negative.  Negative for myalgias.  Skin: Negative.  Negative for rash.  Allergic/Immunologic: Negative.   Neurological: Negative.  Negative for dizziness, tremors, syncope, weakness, light-headedness and headaches.  Hematological: Negative.  Negative for adenopathy. Does not bruise/bleed easily.  Psychiatric/Behavioral: Negative.        Objective:   Physical Exam  Vitals reviewed. Constitutional: She is oriented to person, place, and time. She appears well-developed and well-nourished.  Non-toxic appearance. She does not  have a sickly appearance. She does not appear ill. No distress.  HENT:  Head: Normocephalic and atraumatic.  Right Ear: Hearing, tympanic membrane, external ear and ear canal normal. No decreased hearing is noted.  Left Ear: Hearing, tympanic membrane, external ear and ear canal normal.  Nose: No mucosal edema or rhinorrhea. Right sinus exhibits no maxillary sinus tenderness and no frontal sinus tenderness. Left sinus exhibits no maxillary sinus tenderness and no frontal sinus tenderness.  Mouth/Throat: Oropharynx is clear and moist and mucous membranes are normal. Mucous membranes are not pale, not dry and not cyanotic. No oral lesions. No trismus in the jaw. No uvula swelling. No oropharyngeal exudate, posterior oropharyngeal edema, posterior oropharyngeal erythema or tonsillar abscesses.  Eyes: Conjunctivae are normal. Right eye exhibits no discharge. Left eye exhibits no discharge. No scleral icterus.  Neck: Normal range of motion. Neck supple. No JVD present. No tracheal deviation present. No thyromegaly present.  Cardiovascular: Normal rate, regular rhythm, normal heart sounds and intact distal pulses.  Exam reveals no gallop and no friction rub.   No murmur heard. Pulmonary/Chest: Effort normal and breath sounds normal. No stridor. No respiratory distress. She has no wheezes. She has no rales. She exhibits no tenderness.  Abdominal: Soft. Bowel sounds are normal. She exhibits no distension and no mass. There is no tenderness. There is no rebound and no guarding.  Musculoskeletal: Normal range of motion. She exhibits no edema and no tenderness.  Lymphadenopathy:    She has no cervical adenopathy.  Neurological: She is oriented to person, place, and time.  Skin: Skin is warm and dry. No rash noted. She is not diaphoretic. No erythema. No pallor.      Lab Results  Component Value Date   WBC 8.6 03/30/2011   HGB 12.7 03/30/2011  HCT 37.7 03/30/2011   PLT 292.0 03/30/2011   GLUCOSE 89  03/30/2011   CHOL 179 03/30/2011   TRIG 89.0 03/30/2011   HDL 55.80 03/30/2011   LDLCALC 105* 03/30/2011   ALT 17 03/30/2011   AST 20 03/30/2011   NA 139 03/30/2011   K 4.1 03/30/2011   CL 104 03/30/2011   CREATININE 0.7 03/30/2011   BUN 11 03/30/2011   CO2 27 03/30/2011   TSH 0.805 07/16/2011      Assessment & Plan:

## 2013-05-03 ENCOUNTER — Encounter: Payer: Self-pay | Admitting: Internal Medicine

## 2013-05-03 NOTE — Assessment & Plan Note (Signed)
I will treat the infection with a Zpak She has a cough suppressant at home to take

## 2013-05-11 DIAGNOSIS — K635 Polyp of colon: Secondary | ICD-10-CM

## 2013-05-11 HISTORY — PX: COLONOSCOPY: SHX174

## 2013-05-11 HISTORY — DX: Polyp of colon: K63.5

## 2013-06-13 ENCOUNTER — Encounter: Payer: Self-pay | Admitting: Internal Medicine

## 2013-06-13 ENCOUNTER — Telehealth: Payer: Self-pay | Admitting: Internal Medicine

## 2013-06-13 ENCOUNTER — Encounter: Payer: 59 | Admitting: Internal Medicine

## 2013-06-13 NOTE — Telephone Encounter (Signed)
I agree

## 2013-08-04 ENCOUNTER — Ambulatory Visit (AMBULATORY_SURGERY_CENTER): Payer: Self-pay | Admitting: *Deleted

## 2013-08-04 VITALS — Ht 65.0 in | Wt 211.8 lb

## 2013-08-04 DIAGNOSIS — Z800 Family history of malignant neoplasm of digestive organs: Secondary | ICD-10-CM

## 2013-08-04 MED ORDER — MOVIPREP 100 G PO SOLR: ORAL | Status: DC

## 2013-08-04 NOTE — Progress Notes (Signed)
No allergies to eggs or soy. No problems with anesthesia.  

## 2013-08-10 ENCOUNTER — Encounter: Payer: Self-pay | Admitting: Internal Medicine

## 2013-08-18 ENCOUNTER — Encounter: Payer: Self-pay | Admitting: Internal Medicine

## 2013-08-18 ENCOUNTER — Ambulatory Visit (AMBULATORY_SURGERY_CENTER): Payer: 59 | Admitting: Internal Medicine

## 2013-08-18 VITALS — BP 118/76 | HR 74 | Temp 98.3°F | Resp 19 | Ht 65.0 in | Wt 211.0 lb

## 2013-08-18 DIAGNOSIS — Z800 Family history of malignant neoplasm of digestive organs: Secondary | ICD-10-CM

## 2013-08-18 DIAGNOSIS — D126 Benign neoplasm of colon, unspecified: Secondary | ICD-10-CM

## 2013-08-18 MED ORDER — RANITIDINE HCL 300 MG PO TABS: 300.0000 mg | ORAL_TABLET | Freq: Two times a day (BID) | ORAL | Status: DC

## 2013-08-18 MED ORDER — SODIUM CHLORIDE 0.9 % IV SOLN: 500.0000 mL | INTRAVENOUS | Status: DC

## 2013-08-18 NOTE — Patient Instructions (Signed)
YOU HAD AN ENDOSCOPIC PROCEDURE TODAY AT THE [facility]: Refer to the procedure report that was given to you for any specific questions about what was found during the examination.  If the procedure report does not answer your questions, please call your gastroenterologist to clarify.  If you requested that your care partner not be given the details of your procedure findings, then the procedure report has been included in a sealed envelope for you to review at your convenience later.  YOU SHOULD EXPECT: Some feelings of bloating in the abdomen. Passage of more gas than usual.  Walking can help get rid of the air that was put into your GI tract during the procedure and reduce the bloating. If you had a lower endoscopy (such as a colonoscopy or flexible sigmoidoscopy) you may notice spotting of blood in your stool or on the toilet paper. If you underwent a bowel prep for your procedure, then you may not have a normal bowel movement for a few days.  DIET: Your first meal following the procedure should be a light meal and then it is ok to progress to your normal diet.  A half-sandwich or bowl of soup is an example of a good first meal.  Heavy or fried foods are harder to digest and may make you feel nauseous or bloated.  Likewise meals heavy in dairy and vegetables can cause extra gas to form and this can also increase the bloating.  Drink plenty of fluids but you should avoid alcoholic beverages for 24 hours.  ACTIVITY: Your care partner should take you home directly after the procedure.  You should plan to take it easy, moving slowly for the rest of the day.  You can resume normal activity the day after the procedure however you should NOT DRIVE or use heavy machinery for 24 hours (because of the sedation medicines used during the test).    SYMPTOMS TO REPORT IMMEDIATELY: A gastroenterologist can be reached at any hour.  During normal business hours, 8:30 AM to 5:00 PM Monday through Friday,  call [phone].  After hours and on weekends, please call the GI answering service at [phone] who will take a message and have the physician on call contact you.   Following lower endoscopy (colonoscopy or flexible sigmoidoscopy):  Excessive amounts of blood in the stool  Significant tenderness or worsening of abdominal pains  Swelling of the abdomen that is new, acute  Fever of 100F or higher    FOLLOW UP: If any biopsies were taken you will be contacted by phone or by letter within the next 1-3 weeks.  Call your gastroenterologist if you have not heard about the biopsies in 3 weeks.  Our staff will call the home number listed on your records the next business day following your procedure to check on you and address any questions or concerns that you may have at that time regarding the information given to you following your procedure. This is a courtesy call and so if there is no answer at the home number and we have not heard from you through the emergency physician on call, we will assume that you have returned to your regular daily activities without incident.  SIGNATURES/CONFIDENTIALITY: You and/or your care partner have signed paperwork which will be entered into your electronic medical record.  These signatures attest to the fact that that the information above on your After Visit Summary has been reviewed and is understood.  Full responsibility of the confidentiality   of this discharge information lies with you and/or your care-partner.  Polyp and high fiber diet information given.    Next colonoscopy 5 years-2020

## 2013-08-18 NOTE — Op Note (Signed)
Agency  [facility] [employer] [facility]. [place], [id number]   COLONOSCOPY PROCEDURE REPORT  PATIENT: Latresha, Yahr  MR#: [id number] BIRTHDATE: 11/09/1960 , 89  yrs. old GENDER: Female ENDOSCOPIST: Lafayette Dragon, MD REFERRED TM:[id number] Evalina Field, M.D. PROCEDURE DATE:  08/18/2013 PROCEDURE:   Colonoscopy with snare polypectomy First Screening Colonoscopy - Avg.  risk and is 50 yrs.  old or older - No.  Prior Negative Screening - Now for repeat screening. 10 or more years since last screening Prior Negative Screening - Now for repeat screening.  Above average risk  History of Adenoma - Now for follow-up colonoscopy & has been > or = to 3 yrs.  N/A Polyps Removed Today? Yes. ASA CLASS:   Class I INDICATIONS:father and brother with colon cancer.  Sr.  with colon polyps.  Last colonoscopy April 2009 was normal. MEDICATIONS: MAC sedation, administered by CRNA and propofol (Diprivan) 300mg  IV  DESCRIPTION OF PROCEDURE:   After the risks benefits and alternatives of the procedure were thoroughly explained, informed consent was obtained.  A digital rectal exam revealed no abnormalities of the rectum.   The LB [id number]-[id number]  endoscope was introduced through the anus and advanced to the cecum, which was identified by both the appendix and ileocecal valve. No adverse events experienced.   The quality of the prep was good, using MoviPrep  The instrument was then slowly withdrawn as the colon was fully examined.      COLON FINDINGS: A flat polyp ranging between 3-2mm in size was found in the ascending colon.  A polypectomy was performed with a cold snare and with cold forceps.  The resection was complete and the polyp tissue was completely retrieved.  Retroflexed views revealed no abnormalities. The time to cecum=11 minutes 42 seconds. Withdrawal time=16 minutes 30 seconds.  The scope was withdrawn and the procedure completed. COMPLICATIONS: There were no  complications.  ENDOSCOPIC IMPRESSION: Flat polyp ranging between 3-54mm in size was found in the ascending colon; polypectomy was performed with a cold snare and with cold forceps  RECOMMENDATIONS: 1.  Await pathology results 2.  recall colonoscopy in 5 years High fiber diet   eSigned:  Lafayette Dragon, MD 08/18/2013 8:36 AM   cc:   PATIENT NAME:  Kennia, Vanvorst MR#: [id number]

## 2013-08-18 NOTE — Progress Notes (Signed)
Called to room to assist during endoscopic procedure.  Patient ID and intended procedure confirmed with present staff. Received instructions for my participation in the procedure from the performing physician.  

## 2013-08-18 NOTE — Progress Notes (Signed)
A/ox3 pleased with MAC, report to Jane RN 

## 2013-08-21 ENCOUNTER — Telehealth: Payer: Self-pay | Admitting: *Deleted

## 2013-08-21 NOTE — Telephone Encounter (Signed)
  Follow up Call-  Call back number 08/18/2013  Post procedure Call Back phone  # [phone]  Permission to leave phone message Yes     Patient questions:  Do you have a fever, pain , or abdominal swelling? no Pain Score  0 *  Have you tolerated food without any problems? yes  Have you been able to return to your normal activities? yes  Do you have any questions about your discharge instructions: Diet   no Medications  no Follow up visit  no  Do you have questions or concerns about your Care? no  Actions: * If pain score is 4 or above: No action needed, pain <4.

## 2013-08-22 ENCOUNTER — Encounter: Payer: Self-pay | Admitting: Internal Medicine

## 2013-08-24 LAB — HM COLONOSCOPY

## 2013-11-16 ENCOUNTER — Ambulatory Visit (INDEPENDENT_AMBULATORY_CARE_PROVIDER_SITE_OTHER): Payer: 59 | Admitting: Internal Medicine

## 2013-11-16 ENCOUNTER — Other Ambulatory Visit (INDEPENDENT_AMBULATORY_CARE_PROVIDER_SITE_OTHER): Payer: 59

## 2013-11-16 ENCOUNTER — Encounter: Payer: Self-pay | Admitting: Internal Medicine

## 2013-11-16 VITALS — BP 124/84 | HR 72 | Temp 97.5°F | Resp 16 | Ht 64.0 in | Wt 210.0 lb

## 2013-11-16 DIAGNOSIS — Z1231 Encounter for screening mammogram for malignant neoplasm of breast: Secondary | ICD-10-CM | POA: Insufficient documentation

## 2013-11-16 DIAGNOSIS — Z0000 Encounter for general adult medical examination without abnormal findings: Secondary | ICD-10-CM

## 2013-11-16 DIAGNOSIS — K219 Gastro-esophageal reflux disease without esophagitis: Secondary | ICD-10-CM

## 2013-11-16 DIAGNOSIS — F410 Panic disorder [episodic paroxysmal anxiety] without agoraphobia: Secondary | ICD-10-CM | POA: Insufficient documentation

## 2013-11-16 LAB — CBC WITH DIFFERENTIAL/PLATELET
Basophils Absolute: 0 10*3/uL (ref 0.0–0.1)
Basophils Relative: 0.8 % (ref 0.0–3.0)
Eosinophils Absolute: 0.1 10*3/uL (ref 0.0–0.7)
Eosinophils Relative: 0.9 % (ref 0.0–5.0)
HCT: 38.9 % (ref 36.0–46.0)
Hemoglobin: 12.9 g/dL (ref 12.0–15.0)
Lymphocytes Relative: 43.1 % (ref 12.0–46.0)
Lymphs Abs: 2.8 10*3/uL (ref 0.7–4.0)
MCHC: 33.2 g/dL (ref 30.0–36.0)
MCV: 81.9 fl (ref 78.0–100.0)
Monocytes Absolute: 0.5 10*3/uL (ref 0.1–1.0)
Monocytes Relative: 8.3 % (ref 3.0–12.0)
Neutro Abs: 3 10*3/uL (ref 1.4–7.7)
Neutrophils Relative %: 46.9 % (ref 43.0–77.0)
Platelets: 326 10*3/uL (ref 150.0–400.0)
RBC: 4.75 Mil/uL (ref 3.87–5.11)
RDW: 14.3 % (ref 11.5–15.5)
WBC: 6.4 10*3/uL (ref 4.0–10.5)

## 2013-11-16 LAB — COMPREHENSIVE METABOLIC PANEL
ALT: 21 U/L (ref 0–35)
AST: 23 U/L (ref 0–37)
Albumin: 4 g/dL (ref 3.5–5.2)
Alkaline Phosphatase: 82 U/L (ref 39–117)
BUN: 10 mg/dL (ref 6–23)
CO2: 26 mEq/L (ref 19–32)
Calcium: 9.3 mg/dL (ref 8.4–10.5)
Chloride: 103 mEq/L (ref 96–112)
Creatinine, Ser: 0.6 mg/dL (ref 0.4–1.2)
GFR: 134.16 mL/min (ref >60.00)
Glucose, Bld: 101 mg/dL — ABNORMAL HIGH (ref 70–99)
Potassium: 3.9 mEq/L (ref 3.5–5.1)
Sodium: 136 mEq/L (ref 135–145)
Total Bilirubin: 0.6 mg/dL (ref 0.2–1.2)
Total Protein: 7.4 g/dL (ref 6.0–8.3)

## 2013-11-16 LAB — LIPID PANEL
Cholesterol: 168 mg/dL (ref 0–200)
HDL: 52.8 mg/dL (ref >39.00)
LDL Cholesterol: 98 mg/dL (ref 0–99)
NonHDL: 115.2
Total CHOL/HDL Ratio: 3
Triglycerides: 86 mg/dL (ref 0.0–149.0)
VLDL: 17.2 mg/dL (ref 0.0–40.0)

## 2013-11-16 LAB — TSH: TSH: 0.54 u[IU]/mL (ref 0.35–4.50)

## 2013-11-16 NOTE — Progress Notes (Signed)
Pre visit review using our clinic review tool, if applicable. No additional management support is needed unless otherwise documented below in the visit note. 

## 2013-11-16 NOTE — Patient Instructions (Signed)
Preventive Care for Adults A healthy lifestyle and preventive care can promote health and wellness. Preventive health guidelines for women include the following key practices.  A routine yearly physical is a good way to check with your health care provider about your health and preventive screening. It is a chance to share any concerns and updates on your health and to receive a thorough exam.  Visit your dentist for a routine exam and preventive care every 6 months. Brush your teeth twice a day and floss once a day. Good oral hygiene prevents tooth decay and gum disease.  The frequency of eye exams is based on your age, health, family medical history, use of contact lenses, and other factors. Follow your health care provider's recommendations for frequency of eye exams.  Eat a healthy diet. Foods like vegetables, fruits, whole grains, low-fat dairy products, and lean protein foods contain the nutrients you need without too many calories. Decrease your intake of foods high in solid fats, added sugars, and salt. Eat the right amount of calories for you.Get information about a proper diet from your health care provider, if necessary.  Regular physical exercise is one of the most important things you can do for your health. Most adults should get at least 150 minutes of moderate-intensity exercise (any activity that increases your heart rate and causes you to sweat) each week. In addition, most adults need muscle-strengthening exercises on 2 or more days a week.  Maintain a healthy weight. The body mass index (BMI) is a screening tool to identify possible weight problems. It provides an estimate of body fat based on height and weight. Your health care provider can find your BMI, and can help you achieve or maintain a healthy weight.For adults 20 years and older:  A BMI below 18.5 is considered underweight.  A BMI of 18.5 to 24.9 is normal.  A BMI of 25 to 29.9 is considered overweight.  A BMI of  30 and above is considered obese.  Maintain normal blood lipids and cholesterol levels by exercising and minimizing your intake of saturated fat. Eat a balanced diet with plenty of fruit and vegetables. Blood tests for lipids and cholesterol should begin at age 52 and be repeated every 5 years. If your lipid or cholesterol levels are high, you are over 50, or you are at high risk for heart disease, you may need your cholesterol levels checked more frequently.Ongoing high lipid and cholesterol levels should be treated with medicines if diet and exercise are not working.  If you smoke, find out from your health care provider how to quit. If you do not use tobacco, do not start.  Lung cancer screening is recommended for adults aged 37-80 years who are at high risk for developing lung cancer because of a history of smoking. A yearly low-dose CT scan of the lungs is recommended for people who have at least a 30-pack-year history of smoking and are a current smoker or have quit within the past 15 years. A pack year of smoking is smoking an average of 1 pack of cigarettes a day for 1 year (for example: 1 pack a day for 30 years or 2 packs a day for 15 years). Yearly screening should continue until the smoker has stopped smoking for at least 15 years. Yearly screening should be stopped for people who develop a health problem that would prevent them from having lung cancer treatment.  If you are pregnant, do not drink alcohol. If you are breastfeeding,  be very cautious about drinking alcohol. If you are not pregnant and choose to drink alcohol, do not have more than 1 drink per day. One drink is considered to be 12 ounces (355 mL) of beer, 5 ounces (148 mL) of wine, or 1.5 ounces (44 mL) of liquor.  Avoid use of street drugs. Do not share needles with anyone. Ask for help if you need support or instructions about stopping the use of drugs.  High blood pressure causes heart disease and increases the risk of  stroke. Your blood pressure should be checked at least every 1 to 2 years. Ongoing high blood pressure should be treated with medicines if weight loss and exercise do not work.  If you are 75-52 years old, ask your health care provider if you should take aspirin to prevent strokes.  Diabetes screening involves taking a blood sample to check your fasting blood sugar level. This should be done once every 3 years, after age 15, if you are within normal weight and without risk factors for diabetes. Testing should be considered at a younger age or be carried out more frequently if you are overweight and have at least 1 risk factor for diabetes.  Breast cancer screening is essential preventive care for women. You should practice "breast self-awareness." This means understanding the normal appearance and feel of your breasts and may include breast self-examination. Any changes detected, no matter how small, should be reported to a health care provider. Women in their 58s and 30s should have a clinical breast exam (CBE) by a health care provider as part of a regular health exam every 1 to 3 years. After age 16, women should have a CBE every year. Starting at age 53, women should consider having a mammogram (breast X-ray test) every year. Women who have a family history of breast cancer should talk to their health care provider about genetic screening. Women at a high risk of breast cancer should talk to their health care providers about having an MRI and a mammogram every year.  Breast cancer gene (BRCA)-related cancer risk assessment is recommended for women who have family members with BRCA-related cancers. BRCA-related cancers include breast, ovarian, tubal, and peritoneal cancers. Having family members with these cancers may be associated with an increased risk for harmful changes (mutations) in the breast cancer genes BRCA1 and BRCA2. Results of the assessment will determine the need for genetic counseling and  BRCA1 and BRCA2 testing.  Routine pelvic exams to screen for cancer are no longer recommended for nonpregnant women who are considered low risk for cancer of the pelvic organs (ovaries, uterus, and vagina) and who do not have symptoms. Ask your health care provider if a screening pelvic exam is right for you.  If you have had past treatment for cervical cancer or a condition that could lead to cancer, you need Pap tests and screening for cancer for at least 20 years after your treatment. If Pap tests have been discontinued, your risk factors (such as having a new sexual partner) need to be reassessed to determine if screening should be resumed. Some women have medical problems that increase the chance of getting cervical cancer. In these cases, your health care provider may recommend more frequent screening and Pap tests.  The HPV test is an additional test that may be used for cervical cancer screening. The HPV test looks for the virus that can cause the cell changes on the cervix. The cells collected during the Pap test can be  tested for HPV. The HPV test could be used to screen women aged 47 years and older, and should be used in women of any age who have unclear Pap test results. After the age of 36, women should have HPV testing at the same frequency as a Pap test.  Colorectal cancer can be detected and often prevented. Most routine colorectal cancer screening begins at the age of 38 years and continues through age 58 years. However, your health care provider may recommend screening at an earlier age if you have risk factors for colon cancer. On a yearly basis, your health care provider may provide home test kits to check for hidden blood in the stool. Use of a small camera at the end of a tube, to directly examine the colon (sigmoidoscopy or colonoscopy), can detect the earliest forms of colorectal cancer. Talk to your health care provider about this at age 64, when routine screening begins. Direct  exam of the colon should be repeated every 5-10 years through age 21 years, unless early forms of pre-cancerous polyps or small growths are found.  People who are at an increased risk for hepatitis B should be screened for this virus. You are considered at high risk for hepatitis B if:  You were born in a country where hepatitis B occurs often. Talk with your health care provider about which countries are considered high risk.  Your parents were born in a high-risk country and you have not received a shot to protect against hepatitis B (hepatitis B vaccine).  You have HIV or AIDS.  You use needles to inject street drugs.  You live with, or have sex with, someone who has Hepatitis B.  You get hemodialysis treatment.  You take certain medicines for conditions like cancer, organ transplantation, and autoimmune conditions.  Hepatitis C blood testing is recommended for all people born from [birth year] through 1965 and any individual with known risks for hepatitis C.  Practice safe sex. Use condoms and avoid high-risk sexual practices to reduce the spread of sexually transmitted infections (STIs). STIs include gonorrhea, chlamydia, syphilis, trichomonas, herpes, HPV, and human immunodeficiency virus (HIV). Herpes, HIV, and HPV are viral illnesses that have no cure. They can result in disability, cancer, and death.  You should be screened for sexually transmitted illnesses (STIs) including gonorrhea and chlamydia if:  You are sexually active and are younger than 24 years.  You are older than 24 years and your health care provider tells you that you are at risk for this type of infection.  Your sexual activity has changed since you were last screened and you are at an increased risk for chlamydia or gonorrhea. Ask your health care provider if you are at risk.  If you are at risk of being infected with HIV, it is recommended that you take a prescription medicine daily to prevent HIV infection. This is  called preexposure prophylaxis (PrEP). You are considered at risk if:  You are a heterosexual woman, are sexually active, and are at increased risk for HIV infection.  You take drugs by injection.  You are sexually active with a partner who has HIV.  Talk with your health care provider about whether you are at high risk of being infected with HIV. If you choose to begin PrEP, you should first be tested for HIV. You should then be tested every 3 months for as long as you are taking PrEP.  Osteoporosis is a disease in which the bones lose minerals and strength  with aging. This can result in serious bone fractures or breaks. The risk of osteoporosis can be identified using a bone density scan. Women ages 65 years and over and women at risk for fractures or osteoporosis should discuss screening with their health care providers. Ask your health care provider whether you should take a calcium supplement or vitamin D to reduce the rate of osteoporosis.  Menopause can be associated with physical symptoms and risks. Hormone replacement therapy is available to decrease symptoms and risks. You should talk to your health care provider about whether hormone replacement therapy is right for you.  Use sunscreen. Apply sunscreen liberally and repeatedly throughout the day. You should seek shade when your shadow is shorter than you. Protect yourself by wearing long sleeves, pants, a wide-brimmed hat, and sunglasses year round, whenever you are outdoors.  Once a month, do a whole body skin exam, using a mirror to look at the skin on your back. Tell your health care provider of new moles, moles that have irregular borders, moles that are larger than a pencil eraser, or moles that have changed in shape or color.  Stay current with required vaccines (immunizations).  Influenza vaccine. All adults should be immunized every year.  Tetanus, diphtheria, and acellular pertussis (Td, Tdap) vaccine. Pregnant women should  receive 1 dose of Tdap vaccine during each pregnancy. The dose should be obtained regardless of the length of time since the last dose. Immunization is preferred during the 27th-36th week of gestation. An adult who has not previously received Tdap or who does not know her vaccine status should receive 1 dose of Tdap. This initial dose should be followed by tetanus and diphtheria toxoids (Td) booster doses every 10 years. Adults with an unknown or incomplete history of completing a 3-dose immunization series with Td-containing vaccines should begin or complete a primary immunization series including a Tdap dose. Adults should receive a Td booster every 10 years.  Varicella vaccine. An adult without evidence of immunity to varicella should receive 2 doses or a second dose if she has previously received 1 dose. Pregnant females who do not have evidence of immunity should receive the first dose after pregnancy. This first dose should be obtained before leaving the health care facility. The second dose should be obtained 4-8 weeks after the first dose.  Human papillomavirus (HPV) vaccine. Females aged 13-26 years who have not received the vaccine previously should obtain the 3-dose series. The vaccine is not recommended for use in pregnant females. However, pregnancy testing is not needed before receiving a dose. If a female is found to be pregnant after receiving a dose, no treatment is needed. In that case, the remaining doses should be delayed until after the pregnancy. Immunization is recommended for any person with an immunocompromised condition through the age of 26 years if she did not get any or all doses earlier. During the 3-dose series, the second dose should be obtained 4-8 weeks after the first dose. The third dose should be obtained 24 weeks after the first dose and 16 weeks after the second dose.  Zoster vaccine. One dose is recommended for adults aged 60 years or older unless certain conditions are  present.  Measles, mumps, and rubella (MMR) vaccine. Adults born before 1957 generally are considered immune to measles and mumps. Adults born in 1957 or later should have 1 or more doses of MMR vaccine unless there is a contraindication to the vaccine or there is laboratory evidence of immunity to   each of the three diseases. A routine second dose of MMR vaccine should be obtained at least 28 days after the first dose for students attending postsecondary schools, health care workers, or international travelers. People who received inactivated measles vaccine or an unknown type of measles vaccine during 1963-1967 should receive 2 doses of MMR vaccine. People who received inactivated mumps vaccine or an unknown type of mumps vaccine before 1979 and are at high risk for mumps infection should consider immunization with 2 doses of MMR vaccine. For females of childbearing age, rubella immunity should be determined. If there is no evidence of immunity, females who are not pregnant should be vaccinated. If there is no evidence of immunity, females who are pregnant should delay immunization until after pregnancy. Unvaccinated health care workers born before 1957 who lack laboratory evidence of measles, mumps, or rubella immunity or laboratory confirmation of disease should consider measles and mumps immunization with 2 doses of MMR vaccine or rubella immunization with 1 dose of MMR vaccine.  Pneumococcal 13-valent conjugate (PCV13) vaccine. When indicated, a person who is uncertain of her immunization history and has no record of immunization should receive the PCV13 vaccine. An adult aged 19 years or older who has certain medical conditions and has not been previously immunized should receive 1 dose of PCV13 vaccine. This PCV13 should be followed with a dose of pneumococcal polysaccharide (PPSV23) vaccine. The PPSV23 vaccine dose should be obtained at least 8 weeks after the dose of PCV13 vaccine. An adult aged 19  years or older who has certain medical conditions and previously received 1 or more doses of PPSV23 vaccine should receive 1 dose of PCV13. The PCV13 vaccine dose should be obtained 1 or more years after the last PPSV23 vaccine dose.  Pneumococcal polysaccharide (PPSV23) vaccine. When PCV13 is also indicated, PCV13 should be obtained first. All adults aged 65 years and older should be immunized. An adult younger than age 65 years who has certain medical conditions should be immunized. Any person who resides in a nursing home or long-term care facility should be immunized. An adult smoker should be immunized. People with an immunocompromised condition and certain other conditions should receive both PCV13 and PPSV23 vaccines. People with human immunodeficiency virus (HIV) infection should be immunized as soon as possible after diagnosis. Immunization during chemotherapy or radiation therapy should be avoided. Routine use of PPSV23 vaccine is not recommended for American Indians, [place] Natives, or people younger than 65 years unless there are medical conditions that require PPSV23 vaccine. When indicated, people who have unknown immunization and have no record of immunization should receive PPSV23 vaccine. One-time revaccination 5 years after the first dose of PPSV23 is recommended for people aged 19-64 years who have chronic kidney failure, nephrotic syndrome, asplenia, or immunocompromised conditions. People who received 1-2 doses of PPSV23 before age 65 years should receive another dose of PPSV23 vaccine at age 65 years or later if at least 5 years have passed since the previous dose. Doses of PPSV23 are not needed for people immunized with PPSV23 at or after age 65 years.  Meningococcal vaccine. Adults with asplenia or persistent complement component deficiencies should receive 2 doses of quadrivalent meningococcal conjugate (MenACWY-D) vaccine. The doses should be obtained at least 2 months apart.  Microbiologists working with certain meningococcal bacteria, military recruits, people at risk during an outbreak, and people who travel to or live in countries with a high rate of meningitis should be immunized. A first-year college student up through age   21 years who is living in a residence hall should receive a dose if she did not receive a dose on or after her 16th birthday. Adults who have certain high-risk conditions should receive one or more doses of vaccine.  Hepatitis A vaccine. Adults who wish to be protected from this disease, have certain high-risk conditions, work with hepatitis A-infected animals, work in hepatitis A research labs, or travel to or work in countries with a high rate of hepatitis A should be immunized. Adults who were previously unvaccinated and who anticipate close contact with an international adoptee during the first 60 days after arrival in the [place] from a country with a high rate of hepatitis A should be immunized.  Hepatitis B vaccine. Adults who wish to be protected from this disease, have certain high-risk conditions, may be exposed to blood or other infectious body fluids, are household contacts or sex partners of hepatitis B positive people, are clients or workers in certain care facilities, or travel to or work in countries with a high rate of hepatitis B should be immunized.  Haemophilus influenzae type b (Hib) vaccine. A previously unvaccinated person with asplenia or sickle cell disease or having a scheduled splenectomy should receive 1 dose of Hib vaccine. Regardless of previous immunization, a recipient of a hematopoietic stem cell transplant should receive a 3-dose series 6-12 months after her successful transplant. Hib vaccine is not recommended for adults with HIV infection. Preventive Services / Frequency Ages 43 to 39years  Blood pressure check.** / Every 1 to 2 years.  Lipid and cholesterol check.** / Every 5 years beginning at age  75.  Clinical breast exam.** / Every 3 years for women in their 32s and 74s.  BRCA-related cancer risk assessment.** / For women who have family members with a BRCA-related cancer (breast, ovarian, tubal, or peritoneal cancers).  Pap test.** / Every 2 years from ages 65 through 91. Every 3 years starting at age 34 through age 93 or 72 with a history of 3 consecutive normal Pap tests.  HPV screening.** / Every 3 years from ages 46 through ages 53 to 26 with a history of 3 consecutive normal Pap tests.  Hepatitis C blood test.** / For any individual with known risks for hepatitis C.  Skin self-exam. / Monthly.  Influenza vaccine. / Every year.  Tetanus, diphtheria, and acellular pertussis (Tdap, Td) vaccine.** / Consult your health care provider. Pregnant women should receive 1 dose of Tdap vaccine during each pregnancy. 1 dose of Td every 10 years.  Varicella vaccine.** / Consult your health care provider. Pregnant females who do not have evidence of immunity should receive the first dose after pregnancy.  HPV vaccine. / 3 doses over 6 months, if 70 and younger. The vaccine is not recommended for use in pregnant females. However, pregnancy testing is not needed before receiving a dose.  Measles, mumps, rubella (MMR) vaccine.** / You need at least 1 dose of MMR if you were born in 1957 or later. You may also need a 2nd dose. For females of childbearing age, rubella immunity should be determined. If there is no evidence of immunity, females who are not pregnant should be vaccinated. If there is no evidence of immunity, females who are pregnant should delay immunization until after pregnancy.  Pneumococcal 13-valent conjugate (PCV13) vaccine.** / Consult your health care provider.  Pneumococcal polysaccharide (PPSV23) vaccine.** / 1 to 2 doses if you smoke cigarettes or if you have certain conditions.  Meningococcal vaccine.** /  1 dose if you are age 70 to 51 years and a [place] living in a residence hall, or have one of several medical conditions, you need to get vaccinated against meningococcal disease. You may also need additional booster doses.  Hepatitis A vaccine.** / Consult your health care provider.  Hepatitis B vaccine.** / Consult your health care provider.  Haemophilus influenzae type b (Hib) vaccine.** / Consult your health care provider. Ages 40 to 64years  Blood pressure check.** / Every 1 to 2 years.  Lipid and cholesterol check.** / Every 5 years beginning at age 58 years.  Lung cancer screening. / Every year if you are aged 56-80 years and have a 30-pack-year history of smoking and currently smoke or have quit within the past 15 years. Yearly screening is stopped once you have quit smoking for at least 15 years or develop a health problem that would prevent you from having lung cancer treatment.  Clinical breast exam.** / Every year after age 35 years.  BRCA-related cancer risk assessment.** / For women who have family members with a BRCA-related cancer (breast, ovarian, tubal, or peritoneal cancers).  Mammogram.** / Every year beginning at age 109 years and continuing for as long as you are in good health. Consult with your health care provider.  Pap test.** / Every 3 years starting at age 44 years through age 94 or 70 years with a history of 3 consecutive normal Pap tests.  HPV screening.** / Every 3 years from ages 109 years through ages 50 to 30 years with a history of 3 consecutive normal Pap tests.  Fecal occult blood test (FOBT) of stool. / Every year beginning at age 73 years and continuing until age 59 years. You may not need to do this test if you get a colonoscopy every 10 years.  Flexible sigmoidoscopy or colonoscopy.** / Every 5 years for a flexible sigmoidoscopy or every 10 years for a colonoscopy beginning at age 68 years and continuing until age 12 years.  Hepatitis C blood test.** / For all people born from [birth year] through  1965 and any individual with known risks for hepatitis C.  Skin self-exam. / Monthly.  Influenza vaccine. / Every year.  Tetanus, diphtheria, and acellular pertussis (Tdap/Td) vaccine.** / Consult your health care provider. Pregnant women should receive 1 dose of Tdap vaccine during each pregnancy. 1 dose of Td every 10 years.  Varicella vaccine.** / Consult your health care provider. Pregnant females who do not have evidence of immunity should receive the first dose after pregnancy.  Zoster vaccine.** / 1 dose for adults aged 2 years or older.  Measles, mumps, rubella (MMR) vaccine.** / You need at least 1 dose of MMR if you were born in 1957 or later. You may also need a 2nd dose. For females of childbearing age, rubella immunity should be determined. If there is no evidence of immunity, females who are not pregnant should be vaccinated. If there is no evidence of immunity, females who are pregnant should delay immunization until after pregnancy.  Pneumococcal 13-valent conjugate (PCV13) vaccine.** / Consult your health care provider.  Pneumococcal polysaccharide (PPSV23) vaccine.** / 1 to 2 doses if you smoke cigarettes or if you have certain conditions.  Meningococcal vaccine.** / Consult your health care provider.  Hepatitis A vaccine.** / Consult your health care provider.  Hepatitis B vaccine.** / Consult your health care provider.  Haemophilus influenzae type b (Hib) vaccine.** / Consult your health care provider. Ages 48 years  and over  Blood pressure check.** / Every 1 to 2 years.  Lipid and cholesterol check.** / Every 5 years beginning at age 84 years.  Lung cancer screening. / Every year if you are aged 50-80 years and have a 30-pack-year history of smoking and currently smoke or have quit within the past 15 years. Yearly screening is stopped once you have quit smoking for at least 15 years or develop a health problem that would prevent you from having lung cancer  treatment.  Clinical breast exam.** / Every year after age 24 years.  BRCA-related cancer risk assessment.** / For women who have family members with a BRCA-related cancer (breast, ovarian, tubal, or peritoneal cancers).  Mammogram.** / Every year beginning at age 14 years and continuing for as long as you are in good health. Consult with your health care provider.  Pap test.** / Every 3 years starting at age 17 years through age 31 or 74 years with 3 consecutive normal Pap tests. Testing can be stopped between 65 and 70 years with 3 consecutive normal Pap tests and no abnormal Pap or HPV tests in the past 10 years.  HPV screening.** / Every 3 years from ages 30 years through ages 70 or 28 years with a history of 3 consecutive normal Pap tests. Testing can be stopped between 65 and 70 years with 3 consecutive normal Pap tests and no abnormal Pap or HPV tests in the past 10 years.  Fecal occult blood test (FOBT) of stool. / Every year beginning at age 64 years and continuing until age 92 years. You may not need to do this test if you get a colonoscopy every 10 years.  Flexible sigmoidoscopy or colonoscopy.** / Every 5 years for a flexible sigmoidoscopy or every 10 years for a colonoscopy beginning at age 73 years and continuing until age 39 years.  Hepatitis C blood test.** / For all people born from [birth year] through 1966 and any individual with known risks for hepatitis C.  Osteoporosis screening.** / A one-time screening for women ages 35 years and over and women at risk for fractures or osteoporosis.  Skin self-exam. / Monthly.  Influenza vaccine. / Every year.  Tetanus, diphtheria, and acellular pertussis (Tdap/Td) vaccine.** / 1 dose of Td every 10 years.  Varicella vaccine.** / Consult your health care provider.  Zoster vaccine.** / 1 dose for adults aged 59 years or older.  Pneumococcal 13-valent conjugate (PCV13) vaccine.** / Consult your health care provider.  Pneumococcal  polysaccharide (PPSV23) vaccine.** / 1 dose for all adults aged 8 years and older.  Meningococcal vaccine.** / Consult your health care provider.  Hepatitis A vaccine.** / Consult your health care provider.  Hepatitis B vaccine.** / Consult your health care provider.  Haemophilus influenzae type b (Hib) vaccine.** / Consult your health care provider. ** Family history and personal history of risk and conditions may change your health care provider's recommendations. Document Released: 06/23/2001 Document Revised: 05/02/2013 Document Reviewed: 09/22/2010 [facility] Patient Information 2015 [place], [place]. This information is not intended to replace advice given to you by your health care provider. Make sure you discuss any questions you have with your health care provider.

## 2013-11-18 NOTE — Progress Notes (Signed)
   Subjective:    Patient ID: Betty Bell, female    DOB: 09-Jan-1961, 53 y.o.   MRN: [id number]  HPI Comments: She comes in for a complete physical but she also tells me that she has episodes of "panic attacks" and wants to be treated for this.     Review of Systems  Constitutional: Negative.  Negative for fever, chills, diaphoresis, appetite change and fatigue.  HENT: Negative.   Eyes: Negative.   Respiratory: Negative.  Negative for cough, choking, chest tightness, shortness of breath and stridor.   Cardiovascular: Negative.  Negative for chest pain, palpitations and leg swelling.  Gastrointestinal: Negative.  Negative for nausea, vomiting, abdominal pain, diarrhea, constipation and blood in stool.  Endocrine: Negative.   Genitourinary: Negative.   Musculoskeletal: Negative.   Skin: Negative.  Negative for rash.  Allergic/Immunologic: Negative.   Neurological: Negative.  Negative for dizziness, tremors, seizures, weakness, light-headedness, numbness and headaches.  Hematological: Negative.  Negative for adenopathy. Does not bruise/bleed easily.  Psychiatric/Behavioral: Negative for suicidal ideas, hallucinations, behavioral problems, confusion, sleep disturbance, self-injury, dysphoric mood, decreased concentration and agitation. The patient is nervous/anxious. The patient is not hyperactive.        Objective:   Physical Exam  Vitals reviewed. Constitutional: She is oriented to person, place, and time. She appears well-developed and well-nourished. No distress.  HENT:  Head: Normocephalic and atraumatic.  Mouth/Throat: Oropharynx is clear and moist. No oropharyngeal exudate.  Eyes: Conjunctivae are normal. Right eye exhibits no discharge. Left eye exhibits no discharge. No scleral icterus.  Neck: Normal range of motion. Neck supple. No JVD present. No tracheal deviation present. No thyromegaly present.  Cardiovascular: Normal rate, regular rhythm, normal heart sounds and  intact distal pulses.  Exam reveals no gallop and no friction rub.   No murmur heard. Pulmonary/Chest: Effort normal and breath sounds normal. No stridor. No respiratory distress. She has no wheezes. She has no rales. She exhibits no tenderness.  Abdominal: Soft. Bowel sounds are normal. She exhibits no distension and no mass. There is no tenderness. There is no rebound and no guarding.  Genitourinary: No breast swelling, tenderness, discharge or bleeding. Pelvic exam was performed with patient supine.  Rectal and GU exams deferred at her request as these have recently been done by other doctors  Musculoskeletal: Normal range of motion. She exhibits no edema and no tenderness.  Lymphadenopathy:    She has no cervical adenopathy.  Neurological: She is oriented to person, place, and time.  Skin: Skin is warm and dry. No rash noted. She is not diaphoretic. No erythema. No pallor.          Assessment & Plan:

## 2013-11-18 NOTE — Assessment & Plan Note (Signed)
Psych referral

## 2013-11-18 NOTE — Assessment & Plan Note (Signed)
Exam done Vaccines were reviewed She was referred for a mammo Labs ordered Pt ed material was given

## 2013-12-11 ENCOUNTER — Telehealth: Payer: Self-pay | Admitting: Internal Medicine

## 2013-12-11 NOTE — Telephone Encounter (Signed)
Patient calling to report 2 weeks of upper abdominal pain. She is taking Zantac and it is not helping. States she is eating bland food and is still having a gnawing, burning pain at breast bone area. Also reports constipation. Scheduled with Alonza Bogus, PA on 12/14/13.

## 2013-12-12 ENCOUNTER — Encounter: Payer: Self-pay | Admitting: *Deleted

## 2013-12-14 ENCOUNTER — Ambulatory Visit (INDEPENDENT_AMBULATORY_CARE_PROVIDER_SITE_OTHER): Payer: 59 | Admitting: Gastroenterology

## 2013-12-14 ENCOUNTER — Encounter: Payer: Self-pay | Admitting: Gastroenterology

## 2013-12-14 VITALS — BP 132/80 | HR 100 | Ht 64.0 in | Wt 211.0 lb

## 2013-12-14 DIAGNOSIS — K219 Gastro-esophageal reflux disease without esophagitis: Secondary | ICD-10-CM

## 2013-12-14 DIAGNOSIS — R1013 Epigastric pain: Secondary | ICD-10-CM

## 2013-12-14 MED ORDER — OMEPRAZOLE 40 MG PO CPDR: 40.0000 mg | DELAYED_RELEASE_CAPSULE | Freq: Every day | ORAL | Status: DC

## 2013-12-14 NOTE — Progress Notes (Signed)
     12/14/2013 Betty Bell [id number] January 23, 1961   History of Present Illness:  This is a 53 year old female who is known to Dr. Olevia Perches.  She has history of GERD and usually takes ranitidine 150 mg twice a day. She has tried Nexium in the past but thinks did it made her feel unwell. She has history of EGD in September 2010 by Dr. Collene Mares at which time she was found to have multiple antral erosions, but otherwise was normal. She presents to our office today with complaints of epigastric abdominal pain x 2 weeks. Pain is described as a constant 6/10 on a pain scale, but worsens to 8/10 with eating.  She denies any nausea or vomiting, but does have a sensation of fullness. She does admit to reflux and heartburn. She recently increased her Zantac to 300 mg twice daily. Recent CBC, CMP, and TSH within the past month or within normal limits. Denies taking any NSAIDs or over-the-counter medications except for a daily baby aspirin on occasion.   Current Medications, Allergies, Past Medical History, Past Surgical History, Family History and Social History were reviewed in [employer] record.   Physical Exam: BP 132/80  Pulse 100  Ht 5\' 4"  (1.626 m)  Wt 211 lb (95.709 kg)  BMI 36.20 kg/m2  LMP 03/26/2011 General: Well developed black female in no acute distress Head: Normocephalic and atraumatic Eyes:  Sclerae anicteric, conjunctiva pink  Ears: Normal auditory acuity Lungs: Clear throughout to auscultation Heart: Regular rate and rhythm Abdomen: Soft, non-distended.  Normal bowel sounds.  Moderate epigastric TTP without R/R/G. Musculoskeletal: Symmetrical with no gross deformities  Extremities: No edema  Neurological: Alert oriented x 4, grossly non-focal Psychological:  Alert and cooperative. Normal mood and affect  Assessment and Recommendations: -Epigastric abdominal pain, severe:  ? If reflux related/esophagitis vs other etiologies.  Will check CT scan of the  abdomen with contrast.  I would also like her to stop her zantac and start omeprazole 40 mg daily.  If CT scan negative and no improvement with omeprazole then may need EGD.

## 2013-12-14 NOTE — Patient Instructions (Addendum)
We have sent the following medications to your pharmacy for you to pick up at your convenience: Omeprazole 40 mg ____________________________________________________________________________________________  Betty Bell have been scheduled for a CT scan of the abdomen at [place] ([address]---this is in the same building as Press [facility]).   You are scheduled on 12-15-2013 at 930 AM. You should arrive 15 minutes prior to your appointment time for registration. Please follow the written instructions below on the day of your exam:  WARNING: IF YOU ARE ALLERGIC TO IODINE/X-RAY DYE, PLEASE NOTIFY RADIOLOGY IMMEDIATELY AT [phone]! YOU WILL BE GIVEN A 13 HOUR PREMEDICATION PREP.  1) Do not eat or drink anything after 530 AM (4 hours prior to your test) 2) You have been given 1 bottles of oral contrast to drink. The solution may taste better if refrigerated, but do NOT add ice or any other liquid to this solution. Shake well before drinking.    Drink 1 bottle of contrast @ 830 AM (1 hour prior to your exam)  You may take any medications as prescribed with a small amount of water except for the following: Metformin, Glucophage, Glucovance, Avandamet, Riomet, Fortamet, Actoplus Met, Janumet, Glumetza or Metaglip. The above medications must be held the day of the exam AND 48 hours after the exam.  The purpose of you drinking the oral contrast is to aid in the visualization of your intestinal tract. The contrast solution may cause some diarrhea. Before your exam is started, you will be given a small amount of fluid to drink. Depending on your individual set of symptoms, you may also receive an intravenous injection of x-ray contrast/dye. Plan on being at [facility] for 30 minutes or long, depending on the type of exam you are having performed.  This test typically takes 30-45 minutes to complete.  If you have any questions regarding your exam or if you need to reschedule, you  may call the CT department at [phone] between the hours of 8:00 am and 5:00 pm, Monday-Friday.  ________________________________________________________________________

## 2013-12-15 ENCOUNTER — Ambulatory Visit (INDEPENDENT_AMBULATORY_CARE_PROVIDER_SITE_OTHER)
Admission: RE | Admit: 2013-12-15 | Discharge: 2013-12-15 | Disposition: A | Discharge status: Home / Self Care | Payer: 59 | Source: Ambulatory Visit | Attending: Gastroenterology | Admitting: Gastroenterology

## 2013-12-15 ENCOUNTER — Telehealth: Payer: Self-pay | Admitting: *Deleted

## 2013-12-15 DIAGNOSIS — R1013 Epigastric pain: Secondary | ICD-10-CM

## 2013-12-15 DIAGNOSIS — K219 Gastro-esophageal reflux disease without esophagitis: Secondary | ICD-10-CM

## 2013-12-15 MED ORDER — IOHEXOL 300 MG/ML  SOLN
100.0000 mL | Freq: Once | INTRAMUSCULAR | Status: AC | PRN
  Administered 2013-12-15: 100 mL via INTRAVENOUS

## 2013-12-15 NOTE — Progress Notes (Signed)
Reviewed and agree, CT scan to r/o pancreatitis or other structural abnormality. Possible check amy/lipase. EGD would be the next step

## 2013-12-15 NOTE — Telephone Encounter (Signed)
RECEIVED VIA FAX FROM [facility], PRIOR AUTH NEEDED FOR PATIENTS OMEPRAZOLE. FILLED FORM OUT AND FAXED BACK.

## 2014-01-04 ENCOUNTER — Encounter: Payer: Self-pay | Admitting: Internal Medicine

## 2014-01-04 ENCOUNTER — Ambulatory Visit (INDEPENDENT_AMBULATORY_CARE_PROVIDER_SITE_OTHER)
Admission: RE | Admit: 2014-01-04 | Discharge: 2014-01-04 | Disposition: A | Discharge status: Home / Self Care | Payer: 59 | Source: Ambulatory Visit | Attending: Internal Medicine | Admitting: Internal Medicine

## 2014-01-04 ENCOUNTER — Encounter: Payer: Self-pay | Admitting: Gastroenterology

## 2014-01-04 ENCOUNTER — Ambulatory Visit (INDEPENDENT_AMBULATORY_CARE_PROVIDER_SITE_OTHER): Payer: 59 | Admitting: Internal Medicine

## 2014-01-04 VITALS — BP 130/80 | HR 73 | Temp 97.7°F | Wt 213.0 lb

## 2014-01-04 DIAGNOSIS — S99921A Unspecified injury of right foot, initial encounter: Secondary | ICD-10-CM

## 2014-01-04 DIAGNOSIS — S99919A Unspecified injury of unspecified ankle, initial encounter: Secondary | ICD-10-CM

## 2014-01-04 DIAGNOSIS — S8990XA Unspecified injury of unspecified lower leg, initial encounter: Secondary | ICD-10-CM

## 2014-01-04 DIAGNOSIS — S99929A Unspecified injury of unspecified foot, initial encounter: Secondary | ICD-10-CM

## 2014-01-04 MED ORDER — OXYCODONE-ACETAMINOPHEN 5-325 MG PO TABS: 1.0000 | ORAL_TABLET | Freq: Three times a day (TID) | ORAL | Status: DC | PRN

## 2014-01-04 NOTE — Progress Notes (Signed)
Pre visit review using our clinic review tool, if applicable. No additional management support is needed unless otherwise documented below in the visit note. 

## 2014-01-04 NOTE — Progress Notes (Signed)
   Subjective:    Patient ID: Betty Bell, female    DOB: [birth year], 53 y.o.   MRN: [id number]  HPI   She was walking her dog 12/26/13 in the dark when she struck her right foot and toes against a low wall.  She's continued to have pain up to level IX and the second-fourth toes of the right foot. The pain is constant. It is aggravated by walking or flexing the toes.  Tylenol was of minimal benefit.  She's had associated redness, swelling, joint stiffness, and increased darkness over the toes.  She does have a past history of ankle injury on the right foot and 2013.    Review of Systems She has not had associated fever, chills, or sweats.  She has a history of some hyperpigmentation on one of nails; this is monitored by dermatologist      Objective:   Physical Exam   On exam strength, tone, and deep tendon reflexes are equal and normal in  the lower extremities.  Pedal pulses are excellent.  There is no ischemic skin changes noted.  The right second third and fourth toes are visibly swollen. These are tender to palpation & with any extension or flexion.        Assessment & Plan:  #1 blunt trauma to toes the right foot, rule out fracture  Plan: See orders and recommendations

## 2014-01-04 NOTE — Patient Instructions (Signed)
  Soak your  foot in Epsom salt soaks twice a day for up to 20 minutes. Wear whichever footwear decreases pressure on the involved area; a wooden sandal. Your next office appointment will be determined based upon review of your pending  x-rays. Those instructions will be transmitted to you through My Chart . Followup as needed for your acute issue. Please report any significant change in your symptoms. should be best.

## 2014-02-27 ENCOUNTER — Inpatient Hospital Stay (HOSPITAL_COMMUNITY)
Admission: EM | Admit: 2014-02-27 | Discharge: 2014-03-01 | DRG: 872 | Disposition: A | Discharge status: Home / Self Care | Payer: 59 | Attending: Internal Medicine | Admitting: Internal Medicine

## 2014-02-27 ENCOUNTER — Emergency Department (HOSPITAL_COMMUNITY): Payer: 59

## 2014-02-27 ENCOUNTER — Encounter (HOSPITAL_COMMUNITY): Payer: Self-pay | Admitting: Emergency Medicine

## 2014-02-27 DIAGNOSIS — R109 Unspecified abdominal pain: Secondary | ICD-10-CM

## 2014-02-27 DIAGNOSIS — R509 Fever, unspecified: Secondary | ICD-10-CM

## 2014-02-27 DIAGNOSIS — E860 Dehydration: Secondary | ICD-10-CM

## 2014-02-27 DIAGNOSIS — R197 Diarrhea, unspecified: Secondary | ICD-10-CM

## 2014-02-27 DIAGNOSIS — K580 Irritable bowel syndrome with diarrhea: Secondary | ICD-10-CM | POA: Diagnosis present

## 2014-02-27 DIAGNOSIS — D259 Leiomyoma of uterus, unspecified: Secondary | ICD-10-CM | POA: Diagnosis present

## 2014-02-27 DIAGNOSIS — Z9889 Other specified postprocedural states: Secondary | ICD-10-CM

## 2014-02-27 DIAGNOSIS — Z79899 Other long term (current) drug therapy: Secondary | ICD-10-CM

## 2014-02-27 DIAGNOSIS — R1011 Right upper quadrant pain: Secondary | ICD-10-CM

## 2014-02-27 DIAGNOSIS — F410 Panic disorder [episodic paroxysmal anxiety] without agoraphobia: Secondary | ICD-10-CM | POA: Diagnosis present

## 2014-02-27 DIAGNOSIS — D126 Benign neoplasm of colon, unspecified: Secondary | ICD-10-CM | POA: Diagnosis present

## 2014-02-27 DIAGNOSIS — Z881 Allergy status to other antibiotic agents status: Secondary | ICD-10-CM

## 2014-02-27 DIAGNOSIS — N870 Mild cervical dysplasia: Secondary | ICD-10-CM | POA: Diagnosis present

## 2014-02-27 DIAGNOSIS — A419 Sepsis, unspecified organism: Secondary | ICD-10-CM | POA: Diagnosis not present

## 2014-02-27 DIAGNOSIS — R1013 Epigastric pain: Secondary | ICD-10-CM

## 2014-02-27 DIAGNOSIS — A084 Viral intestinal infection, unspecified: Secondary | ICD-10-CM

## 2014-02-27 DIAGNOSIS — E876 Hypokalemia: Secondary | ICD-10-CM

## 2014-02-27 DIAGNOSIS — N7011 Chronic salpingitis: Secondary | ICD-10-CM | POA: Diagnosis present

## 2014-02-27 DIAGNOSIS — Z888 Allergy status to other drugs, medicaments and biological substances status: Secondary | ICD-10-CM

## 2014-02-27 DIAGNOSIS — J45909 Unspecified asthma, uncomplicated: Secondary | ICD-10-CM | POA: Diagnosis present

## 2014-02-27 DIAGNOSIS — Z91013 Allergy to seafood: Secondary | ICD-10-CM

## 2014-02-27 DIAGNOSIS — K219 Gastro-esophageal reflux disease without esophagitis: Secondary | ICD-10-CM

## 2014-02-27 DIAGNOSIS — F419 Anxiety disorder, unspecified: Secondary | ICD-10-CM | POA: Diagnosis present

## 2014-02-27 DIAGNOSIS — R000 Tachycardia, unspecified: Secondary | ICD-10-CM

## 2014-02-27 DIAGNOSIS — E861 Hypovolemia: Secondary | ICD-10-CM | POA: Diagnosis present

## 2014-02-27 DIAGNOSIS — Z7982 Long term (current) use of aspirin: Secondary | ICD-10-CM

## 2014-02-27 DIAGNOSIS — Z882 Allergy status to sulfonamides status: Secondary | ICD-10-CM

## 2014-02-27 DIAGNOSIS — Z880 Allergy status to penicillin: Secondary | ICD-10-CM

## 2014-02-27 LAB — CBC WITH DIFFERENTIAL/PLATELET
Basophils Absolute: 0 10*3/uL (ref 0.0–0.1)
Basophils Relative: 0 % (ref 0–1)
Eosinophils Absolute: 0 10*3/uL (ref 0.0–0.7)
Eosinophils Relative: 0 % (ref 0–5)
HCT: 41.4 % (ref 36.0–46.0)
Hemoglobin: 13.4 g/dL (ref 12.0–15.0)
Lymphocytes Relative: 9 % — ABNORMAL LOW (ref 12–46)
Lymphs Abs: 1 10*3/uL (ref 0.7–4.0)
MCH: 26.3 pg (ref 26.0–34.0)
MCHC: 32.4 g/dL (ref 30.0–36.0)
MCV: 81.3 fL (ref 78.0–100.0)
Monocytes Absolute: 0.7 10*3/uL (ref 0.1–1.0)
Monocytes Relative: 7 % (ref 3–12)
Neutro Abs: 9.3 10*3/uL — ABNORMAL HIGH (ref 1.7–7.7)
Neutrophils Relative %: 84 % — ABNORMAL HIGH (ref 43–77)
Platelets: 293 10*3/uL (ref 150–400)
RBC: 5.09 MIL/uL (ref 3.87–5.11)
RDW: 13.9 % (ref 11.5–15.5)
WBC: 11 10*3/uL — ABNORMAL HIGH (ref 4.0–10.5)

## 2014-02-27 LAB — COMPREHENSIVE METABOLIC PANEL
ALT: 20 U/L (ref 0–35)
AST: 21 U/L (ref 0–37)
Albumin: 3.9 g/dL (ref 3.5–5.2)
Alkaline Phosphatase: 118 U/L — ABNORMAL HIGH (ref 39–117)
Anion gap: 16 — ABNORMAL HIGH (ref 5–15)
BUN: 8 mg/dL (ref 6–23)
CO2: 22 mEq/L (ref 19–32)
Calcium: 9.4 mg/dL (ref 8.4–10.5)
Chloride: 101 mEq/L (ref 96–112)
Creatinine, Ser: 0.6 mg/dL (ref 0.50–1.10)
GFR calc Af Amer: >90 mL/min (ref >90)
GFR calc non Af Amer: >90 mL/min (ref >90)
Glucose, Bld: 105 mg/dL — ABNORMAL HIGH (ref 70–99)
Potassium: 4.1 mEq/L (ref 3.7–5.3)
Sodium: 139 mEq/L (ref 137–147)
Total Bilirubin: 0.5 mg/dL (ref 0.3–1.2)
Total Protein: 7.9 g/dL (ref 6.0–8.3)

## 2014-02-27 LAB — URINALYSIS, ROUTINE W REFLEX MICROSCOPIC
Bilirubin Urine: NEGATIVE
Glucose, UA: NEGATIVE mg/dL
Ketones, ur: NEGATIVE mg/dL
Leukocytes, UA: NEGATIVE
Nitrite: NEGATIVE
Protein, ur: NEGATIVE mg/dL
Specific Gravity, Urine: 1.013 (ref 1.005–1.030)
Urobilinogen, UA: 1 mg/dL (ref 0.0–1.0)
pH: 6.5 (ref 5.0–8.0)

## 2014-02-27 LAB — I-STAT TROPONIN, ED: Troponin i, poc: 0 ng/mL (ref 0.00–0.08)

## 2014-02-27 LAB — URINE MICROSCOPIC-ADD ON

## 2014-02-27 LAB — I-STAT CG4 LACTIC ACID, ED: Lactic Acid, Venous: 1.17 mmol/L (ref 0.5–2.2)

## 2014-02-27 LAB — LIPASE, BLOOD: Lipase: 22 U/L (ref 11–59)

## 2014-02-27 MED ORDER — SODIUM CHLORIDE 0.9 % IV BOLUS (SEPSIS)
1000.0000 mL | Freq: Once | INTRAVENOUS | Status: AC
  Administered 2014-02-28: 1000 mL via INTRAVENOUS

## 2014-02-27 MED ORDER — ONDANSETRON HCL 4 MG/2ML IJ SOLN
4.0000 mg | Freq: Once | INTRAMUSCULAR | Status: DC
  Filled 2014-02-27: qty 2

## 2014-02-27 MED ORDER — ACETAMINOPHEN 325 MG PO TABS
650.0000 mg | ORAL_TABLET | Freq: Once | ORAL | Status: AC
  Administered 2014-02-27: 650 mg via ORAL
  Filled 2014-02-27: qty 2

## 2014-02-27 MED ORDER — MORPHINE SULFATE 4 MG/ML IJ SOLN
4.0000 mg | Freq: Once | INTRAMUSCULAR | Status: DC
Start: 2014-02-27 — End: 2014-03-01
  Filled 2014-02-27: qty 1

## 2014-02-27 MED ORDER — SODIUM CHLORIDE 0.9 % IV BOLUS (SEPSIS)
1000.0000 mL | Freq: Once | INTRAVENOUS | Status: AC
  Administered 2014-02-27: 1000 mL via INTRAVENOUS

## 2014-02-27 NOTE — ED Notes (Signed)
MD Goldston at bedside  

## 2014-02-27 NOTE — ED Notes (Signed)
Pt reports that she has had 4-5 episodes of diarrhea in the last 24 hrs. She denies nausea currently. She is reporting RUQ pain and back pain .

## 2014-02-27 NOTE — ED Notes (Signed)
PA Jarrett Soho made aware of no change in heart rate.

## 2014-02-27 NOTE — ED Notes (Signed)
Pt reports RUQ pain with dizziness since yesterday.  Pt reports pain is worse when eating.

## 2014-02-27 NOTE — ED Notes (Addendum)
US at bedside

## 2014-02-28 ENCOUNTER — Encounter (HOSPITAL_COMMUNITY): Payer: Self-pay

## 2014-02-28 ENCOUNTER — Emergency Department (HOSPITAL_COMMUNITY): Payer: 59

## 2014-02-28 DIAGNOSIS — R197 Diarrhea, unspecified: Secondary | ICD-10-CM

## 2014-02-28 DIAGNOSIS — F410 Panic disorder [episodic paroxysmal anxiety] without agoraphobia: Secondary | ICD-10-CM | POA: Diagnosis present

## 2014-02-28 DIAGNOSIS — Z880 Allergy status to penicillin: Secondary | ICD-10-CM | POA: Diagnosis not present

## 2014-02-28 DIAGNOSIS — E876 Hypokalemia: Secondary | ICD-10-CM | POA: Diagnosis present

## 2014-02-28 DIAGNOSIS — R109 Unspecified abdominal pain: Secondary | ICD-10-CM

## 2014-02-28 DIAGNOSIS — K219 Gastro-esophageal reflux disease without esophagitis: Secondary | ICD-10-CM | POA: Diagnosis present

## 2014-02-28 DIAGNOSIS — Z79899 Other long term (current) drug therapy: Secondary | ICD-10-CM | POA: Diagnosis not present

## 2014-02-28 DIAGNOSIS — D259 Leiomyoma of uterus, unspecified: Secondary | ICD-10-CM | POA: Diagnosis present

## 2014-02-28 DIAGNOSIS — N870 Mild cervical dysplasia: Secondary | ICD-10-CM | POA: Diagnosis present

## 2014-02-28 DIAGNOSIS — J45909 Unspecified asthma, uncomplicated: Secondary | ICD-10-CM | POA: Diagnosis present

## 2014-02-28 DIAGNOSIS — Z882 Allergy status to sulfonamides status: Secondary | ICD-10-CM | POA: Diagnosis not present

## 2014-02-28 DIAGNOSIS — A419 Sepsis, unspecified organism: Secondary | ICD-10-CM | POA: Diagnosis present

## 2014-02-28 DIAGNOSIS — R100 Acute abdomen: Secondary | ICD-10-CM

## 2014-02-28 DIAGNOSIS — F419 Anxiety disorder, unspecified: Secondary | ICD-10-CM | POA: Diagnosis present

## 2014-02-28 DIAGNOSIS — Z91013 Allergy to seafood: Secondary | ICD-10-CM | POA: Diagnosis not present

## 2014-02-28 DIAGNOSIS — K580 Irritable bowel syndrome with diarrhea: Secondary | ICD-10-CM | POA: Diagnosis present

## 2014-02-28 DIAGNOSIS — E861 Hypovolemia: Secondary | ICD-10-CM | POA: Diagnosis present

## 2014-02-28 DIAGNOSIS — A084 Viral intestinal infection, unspecified: Secondary | ICD-10-CM | POA: Diagnosis present

## 2014-02-28 DIAGNOSIS — Z7982 Long term (current) use of aspirin: Secondary | ICD-10-CM | POA: Diagnosis not present

## 2014-02-28 DIAGNOSIS — R509 Fever, unspecified: Secondary | ICD-10-CM

## 2014-02-28 DIAGNOSIS — E860 Dehydration: Secondary | ICD-10-CM | POA: Diagnosis present

## 2014-02-28 DIAGNOSIS — Z888 Allergy status to other drugs, medicaments and biological substances status: Secondary | ICD-10-CM | POA: Diagnosis not present

## 2014-02-28 DIAGNOSIS — R000 Tachycardia, unspecified: Secondary | ICD-10-CM

## 2014-02-28 DIAGNOSIS — Z881 Allergy status to other antibiotic agents status: Secondary | ICD-10-CM | POA: Diagnosis not present

## 2014-02-28 DIAGNOSIS — D126 Benign neoplasm of colon, unspecified: Secondary | ICD-10-CM | POA: Diagnosis present

## 2014-02-28 DIAGNOSIS — N7011 Chronic salpingitis: Secondary | ICD-10-CM | POA: Diagnosis present

## 2014-02-28 DIAGNOSIS — Z9889 Other specified postprocedural states: Secondary | ICD-10-CM | POA: Diagnosis not present

## 2014-02-28 LAB — CBC WITH DIFFERENTIAL/PLATELET
Basophils Absolute: 0 10*3/uL (ref 0.0–0.1)
Basophils Relative: 0 % (ref 0–1)
Eosinophils Absolute: 0 10*3/uL (ref 0.0–0.7)
Eosinophils Relative: 0 % (ref 0–5)
HCT: 35 % — ABNORMAL LOW (ref 36.0–46.0)
Hemoglobin: 11.8 g/dL — ABNORMAL LOW (ref 12.0–15.0)
Lymphocytes Relative: 10 % — ABNORMAL LOW (ref 12–46)
Lymphs Abs: 1.1 10*3/uL (ref 0.7–4.0)
MCH: 27.3 pg (ref 26.0–34.0)
MCHC: 33.7 g/dL (ref 30.0–36.0)
MCV: 81 fL (ref 78.0–100.0)
Monocytes Absolute: 0.4 10*3/uL (ref 0.1–1.0)
Monocytes Relative: 3 % (ref 3–12)
Neutro Abs: 9.2 10*3/uL — ABNORMAL HIGH (ref 1.7–7.7)
Neutrophils Relative %: 87 % — ABNORMAL HIGH (ref 43–77)
Platelets: 238 10*3/uL (ref 150–400)
RBC: 4.32 MIL/uL (ref 3.87–5.11)
RDW: 14.3 % (ref 11.5–15.5)
WBC: 10.6 10*3/uL — ABNORMAL HIGH (ref 4.0–10.5)

## 2014-02-28 LAB — COMPREHENSIVE METABOLIC PANEL
ALT: 14 U/L (ref 0–35)
AST: 16 U/L (ref 0–37)
Albumin: 3 g/dL — ABNORMAL LOW (ref 3.5–5.2)
Alkaline Phosphatase: 87 U/L (ref 39–117)
Anion gap: 12 (ref 5–15)
BUN: 6 mg/dL (ref 6–23)
CO2: 20 mEq/L (ref 19–32)
Calcium: 7.7 mg/dL — ABNORMAL LOW (ref 8.4–10.5)
Chloride: 107 mEq/L (ref 96–112)
Creatinine, Ser: 0.6 mg/dL (ref 0.50–1.10)
GFR calc Af Amer: >90 mL/min (ref >90)
GFR calc non Af Amer: >90 mL/min (ref >90)
Glucose, Bld: 120 mg/dL — ABNORMAL HIGH (ref 70–99)
Potassium: 3.2 mEq/L — ABNORMAL LOW (ref 3.7–5.3)
Sodium: 139 mEq/L (ref 137–147)
Total Bilirubin: 0.4 mg/dL (ref 0.3–1.2)
Total Protein: 6.1 g/dL (ref 6.0–8.3)

## 2014-02-28 LAB — RESPIRATORY VIRUS PANEL
Adenovirus: NOT DETECTED
Influenza A H1: NOT DETECTED
Influenza A H3: NOT DETECTED
Influenza A: NOT DETECTED
Influenza B: NOT DETECTED
Metapneumovirus: NOT DETECTED
Parainfluenza 1: NOT DETECTED
Parainfluenza 2: NOT DETECTED
Parainfluenza 3: NOT DETECTED
Respiratory Syncytial Virus A: NOT DETECTED
Respiratory Syncytial Virus B: NOT DETECTED
Rhinovirus: DETECTED — AB

## 2014-02-28 LAB — CLOSTRIDIUM DIFFICILE BY PCR: Toxigenic C. Difficile by PCR: NEGATIVE

## 2014-02-28 LAB — RAPID URINE DRUG SCREEN, HOSP PERFORMED
Amphetamines: NOT DETECTED
Barbiturates: NOT DETECTED
Benzodiazepines: NOT DETECTED
Cocaine: NOT DETECTED
Opiates: NOT DETECTED
Tetrahydrocannabinol: NOT DETECTED

## 2014-02-28 LAB — HIV ANTIBODY (ROUTINE TESTING W REFLEX): HIV 1&2 Ab, 4th Generation: NONREACTIVE

## 2014-02-28 LAB — TROPONIN I
Troponin I: <0.3 ng/mL
Troponin I: <0.3 ng/mL (ref <0.30)
Troponin I: <0.3 ng/mL (ref <0.30)

## 2014-02-28 LAB — MRSA PCR SCREENING: MRSA by PCR: NEGATIVE

## 2014-02-28 MED ORDER — ACETAMINOPHEN 500 MG PO TABS
1000.0000 mg | ORAL_TABLET | Freq: Once | ORAL | Status: AC
  Administered 2014-02-28: 1000 mg via ORAL
  Filled 2014-02-28: qty 2

## 2014-02-28 MED ORDER — METRONIDAZOLE IN NACL 5-0.79 MG/ML-% IV SOLN
500.0000 mg | Freq: Three times a day (TID) | INTRAVENOUS | Status: DC
  Administered 2014-02-28 – 2014-03-01 (×4): 500 mg via INTRAVENOUS
  Filled 2014-02-28 (×6): qty 100

## 2014-02-28 MED ORDER — SUCRALFATE 1 GM/10ML PO SUSP
1.0000 g | Freq: Three times a day (TID) | ORAL | Status: DC
  Administered 2014-02-28 – 2014-03-01 (×3): 1 g via ORAL
  Filled 2014-02-28 (×6): qty 10

## 2014-02-28 MED ORDER — IOHEXOL 300 MG/ML  SOLN: 100.0000 mL | Freq: Once | INTRAMUSCULAR | Status: DC | PRN

## 2014-02-28 MED ORDER — SODIUM CHLORIDE 0.9 % IJ SOLN
3.0000 mL | Freq: Two times a day (BID) | INTRAMUSCULAR | Status: DC
  Administered 2014-02-28 (×2): 3 mL via INTRAVENOUS

## 2014-02-28 MED ORDER — IOHEXOL 350 MG/ML SOLN
100.0000 mL | Freq: Once | INTRAVENOUS | Status: AC | PRN
  Administered 2014-02-28: 100 mL via INTRAVENOUS

## 2014-02-28 MED ORDER — PANTOPRAZOLE SODIUM 40 MG IV SOLR
40.0000 mg | Freq: Two times a day (BID) | INTRAVENOUS | Status: DC
  Filled 2014-02-28 (×2): qty 40

## 2014-02-28 MED ORDER — VANCOMYCIN HCL IN DEXTROSE 1-5 GM/200ML-% IV SOLN
1000.0000 mg | Freq: Once | INTRAVENOUS | Status: AC
  Administered 2014-02-28: 1000 mg via INTRAVENOUS
  Filled 2014-02-28: qty 200

## 2014-02-28 MED ORDER — IBUPROFEN 800 MG PO TABS
800.0000 mg | ORAL_TABLET | Freq: Once | ORAL | Status: AC
  Administered 2014-02-28: 800 mg via ORAL
  Filled 2014-02-28: qty 1

## 2014-02-28 MED ORDER — LEVOFLOXACIN IN D5W 750 MG/150ML IV SOLN
750.0000 mg | Freq: Every day | INTRAVENOUS | Status: DC
  Administered 2014-02-28 – 2014-03-01 (×2): 750 mg via INTRAVENOUS
  Filled 2014-02-28 (×3): qty 150

## 2014-02-28 MED ORDER — HEPARIN SODIUM (PORCINE) 5000 UNIT/ML IJ SOLN
5000.0000 [IU] | Freq: Three times a day (TID) | INTRAMUSCULAR | Status: DC
  Administered 2014-02-28 – 2014-03-01 (×3): 5000 [IU] via SUBCUTANEOUS
  Filled 2014-02-28 (×7): qty 1

## 2014-02-28 MED ORDER — POTASSIUM CHLORIDE 20 MEQ/15ML (10%) PO LIQD
40.0000 meq | Freq: Once | ORAL | Status: AC
  Administered 2014-02-28: 40 meq via ORAL
  Filled 2014-02-28: qty 30

## 2014-02-28 MED ORDER — SODIUM CHLORIDE 0.9 % IV BOLUS (SEPSIS)
1000.0000 mL | Freq: Once | INTRAVENOUS | Status: AC
  Administered 2014-02-28: 1000 mL via INTRAVENOUS

## 2014-02-28 MED ORDER — LORAZEPAM 2 MG/ML IJ SOLN
1.0000 mg | Freq: Once | INTRAMUSCULAR | Status: AC
  Administered 2014-02-28: 1 mg via INTRAVENOUS
  Filled 2014-02-28: qty 1

## 2014-02-28 MED ORDER — VANCOMYCIN HCL IN DEXTROSE 1-5 GM/200ML-% IV SOLN
1000.0000 mg | Freq: Three times a day (TID) | INTRAVENOUS | Status: DC
  Administered 2014-02-28: 1000 mg via INTRAVENOUS
  Filled 2014-02-28 (×2): qty 200

## 2014-02-28 MED ORDER — RANITIDINE HCL 150 MG/10ML PO SYRP
150.0000 mg | ORAL_SOLUTION | Freq: Two times a day (BID) | ORAL | Status: DC
  Administered 2014-02-28 – 2014-03-01 (×2): 150 mg via ORAL
  Filled 2014-02-28 (×3): qty 10

## 2014-02-28 MED ORDER — IOHEXOL 300 MG/ML  SOLN
50.0000 mL | Freq: Once | INTRAMUSCULAR | Status: AC | PRN
  Administered 2014-02-28: 50 mL via ORAL

## 2014-02-28 MED ORDER — SODIUM CHLORIDE 0.9 % IV SOLN
INTRAVENOUS | Status: DC
  Administered 2014-02-28: 06:00:00 via INTRAVENOUS

## 2014-02-28 NOTE — ED Notes (Signed)
Pt transported to XRAY °

## 2014-02-28 NOTE — ED Notes (Signed)
Pt denies any nausea or vomiting. Pt reports diarrhea since 10/19 night. Pt denies pain at present time. Pt states "my right rib pain stopped after I had diarrhea".

## 2014-02-28 NOTE — ED Notes (Signed)
Pt returned from CT °

## 2014-02-28 NOTE — ED Provider Notes (Signed)
Patient received in sign out from PA Merrell at shift change.  Briefly 53 y.o. F with RUQ abdominal pain, began yesterday and progressively worsening.  Pain worse after eating with noted diarrhea.  No urinary sx or vaginal complaints noted.  Lab work was obtained which is overall reassuring.  Abdominal ultrasound was also obtained which is negative for acute findings.  While in the ED, patient has remained persistently tachycardic unresponsive to fluids, motrin and tylenol.  Concern for PE.    Plan:  CT abdomen/pelvis and CTA chest pending.  dispo accordingly.  Results for orders placed during the hospital encounter of 02/27/14  CBC WITH DIFFERENTIAL      Result Value Ref Range   WBC 11.0 (*) 4.0 - 10.5 K/uL   RBC 5.09  3.87 - 5.11 MIL/uL   Hemoglobin 13.4  12.0 - 15.0 g/dL   HCT 41.4  36.0 - 46.0 %   MCV 81.3  78.0 - 100.0 fL   MCH 26.3  26.0 - 34.0 pg   MCHC 32.4  30.0 - 36.0 g/dL   RDW 13.9  11.5 - 15.5 %   Platelets 293  150 - 400 K/uL   Neutrophils Relative % 84 (*) 43 - 77 %   Neutro Abs 9.3 (*) 1.7 - 7.7 K/uL   Lymphocytes Relative 9 (*) 12 - 46 %   Lymphs Abs 1.0  0.7 - 4.0 K/uL   Monocytes Relative 7  3 - 12 %   Monocytes Absolute 0.7  0.1 - 1.0 K/uL   Eosinophils Relative 0  0 - 5 %   Eosinophils Absolute 0.0  0.0 - 0.7 K/uL   Basophils Relative 0  0 - 1 %   Basophils Absolute 0.0  0.0 - 0.1 K/uL  COMPREHENSIVE METABOLIC PANEL      Result Value Ref Range   Sodium 139  137 - 147 mEq/L   Potassium 4.1  3.7 - 5.3 mEq/L   Chloride 101  96 - 112 mEq/L   CO2 22  19 - 32 mEq/L   Glucose, Bld 105 (*) 70 - 99 mg/dL   BUN 8  6 - 23 mg/dL   Creatinine, Ser 0.60  0.50 - 1.10 mg/dL   Calcium 9.4  8.4 - 10.5 mg/dL   Total Protein 7.9  6.0 - 8.3 g/dL   Albumin 3.9  3.5 - 5.2 g/dL   AST 21  0 - 37 U/L   ALT 20  0 - 35 U/L   Alkaline Phosphatase 118 (*) 39 - 117 U/L   Total Bilirubin 0.5  0.3 - 1.2 mg/dL   GFR calc non Af Amer >90  >90 mL/min   GFR calc Af Amer >90  >90 mL/min   Anion gap 16 (*) 5 - 15  LIPASE, BLOOD      Result Value Ref Range   Lipase 22  11 - 59 U/L  URINALYSIS, ROUTINE W REFLEX MICROSCOPIC      Result Value Ref Range   Color, Urine YELLOW  YELLOW   APPearance CLEAR  CLEAR   Specific Gravity, Urine 1.013  1.005 - 1.030   pH 6.5  5.0 - 8.0   Glucose, UA NEGATIVE  NEGATIVE mg/dL   Hgb urine dipstick TRACE (*) NEGATIVE   Bilirubin Urine NEGATIVE  NEGATIVE   Ketones, ur NEGATIVE  NEGATIVE mg/dL   Protein, ur NEGATIVE  NEGATIVE mg/dL   Urobilinogen, UA 1.0  0.0 - 1.0 mg/dL   Nitrite NEGATIVE  NEGATIVE  Leukocytes, UA NEGATIVE  NEGATIVE  URINE MICROSCOPIC-ADD ON      Result Value Ref Range   Squamous Epithelial / LPF FEW (*) RARE   WBC, UA 0-2  <3 WBC/hpf   RBC / HPF 3-6  <3 RBC/hpf   Bacteria, UA FEW (*) RARE   Urine-Other MUCOUS PRESENT    I-STAT TROPOININ, ED      Result Value Ref Range   Troponin i, poc 0.00  0.00 - 0.08 ng/mL   Comment 3           I-STAT CG4 LACTIC ACID, ED      Result Value Ref Range   Lactic Acid, Venous 1.17  0.5 - 2.2 mmol/L   Dg Chest 2 View  02/28/2014   CLINICAL DATA:  Upper abdominal pain and shortness of breath for 24 hr.  EXAM: CHEST  2 VIEW  COMPARISON:  04/1912  FINDINGS: The heart size and mediastinal contours are within normal limits. Both lungs are clear. The visualized skeletal structures are unremarkable.  IMPRESSION: No active cardiopulmonary disease.   Electronically Signed   By: Lucienne Capers M.D.   On: 02/28/2014 00:23   Ct Angio Chest Pe W/cm &/or Wo Cm  02/28/2014   CLINICAL DATA:  Tachycardia.  Right upper quadrant pain.  EXAM: CT ANGIOGRAPHY CHEST  CT ABDOMEN AND PELVIS WITH CONTRAST  TECHNIQUE: Multidetector CT imaging of the chest was performed using the standard protocol during bolus administration of intravenous contrast. Multiplanar CT image reconstructions and MIPs were obtained to evaluate the vascular anatomy. Multidetector CT imaging of the abdomen and pelvis was performed using  the standard protocol during bolus administration of intravenous contrast.  CONTRAST:  100 mL Omnipaque 350  COMPARISON:  CT abdomen and pelvis 12/15/2013  FINDINGS: CTA CHEST FINDINGS  Examination is technically limited due to poor contrast bolus. The main and lobar pulmonary arteries are moderately well visualized and are free of large central pulmonary emboli. Segmental and peripheral arteries are not able to be adequately evaluated for pulmonary embolus.  Normal heart size. Normal caliber thoracic aorta. Great vessel origins are patent. There is residual contrast material in the esophagus without dilatation. This may indicate reflux or dysmotility. No significant lymphadenopathy in the chest.  Evaluation of lungs is limited due to respiratory motion artifact. There appears to be dependent atelectasis in the lung bases. There is no evidence of focal consolidation. No pneumothorax. No pleural effusion.  CT ABDOMEN and PELVIS FINDINGS  The liver, spleen, gallbladder, pancreas, adrenal glands, kidneys, abdominal aorta, inferior vena cava, and retroperitoneal lymph nodes are unremarkable. Stomach, small bowel, and colon are not abnormally distended and no discrete wall thickening is appreciated. No free air or free fluid in the abdomen. Abdominal wall musculature appears intact.  Pelvis: Uterus and ovaries are not enlarged. No free or loculated pelvic fluid collections. No pelvic mass or lymphadenopathy. Appendix is normal. No evidence of diverticulitis. No destructive bone lesions.  Review of the MIP images confirms the above findings.  IMPRESSION: Technically limited study is indeterminate for evaluation of possible pulmonary embolus. No evidence of active pulmonary disease.  No acute process demonstrated in the abdomen or pelvis.   Electronically Signed   By: Lucienne Capers M.D.   On: 02/28/2014 03:07   US Abdomen Complete  02/28/2014   CLINICAL DATA:  Right upper quadrant pain, fever, back pain, and  diarrhea for 2 days. Elevated white cell count of 11.  EXAM: ULTRASOUND ABDOMEN COMPLETE  COMPARISON:  CT abdomen  and pelvis 12/15/2013.  FINDINGS: Gallbladder: No gallstones or wall thickening visualized. No sonographic Murphy sign noted.  Common bile duct: Diameter: 6.3 mm, normal  Liver: No focal lesion identified. Within normal limits in parenchymal echogenicity.  IVC: No abnormality visualized.  Pancreas: Visualized portion unremarkable.  Spleen: Size and appearance within normal limits.  Right Kidney: Length: 11.4 cm. Echogenicity within normal limits. No mass or hydronephrosis visualized.  Left Kidney: Length: 10.9 cm. Echogenicity within normal limits. No mass or hydronephrosis visualized.  Abdominal aorta: No aneurysm visualized.  Other findings: None.  IMPRESSION: No acute abnormalities demonstrated.   Electronically Signed   By: Lucienne Capers M.D.   On: 02/28/2014 00:07   Ct Abdomen Pelvis W Contrast  02/28/2014   CLINICAL DATA:  Tachycardia.  Right upper quadrant pain.  EXAM: CT ANGIOGRAPHY CHEST  CT ABDOMEN AND PELVIS WITH CONTRAST  TECHNIQUE: Multidetector CT imaging of the chest was performed using the standard protocol during bolus administration of intravenous contrast. Multiplanar CT image reconstructions and MIPs were obtained to evaluate the vascular anatomy. Multidetector CT imaging of the abdomen and pelvis was performed using the standard protocol during bolus administration of intravenous contrast.  CONTRAST:  100 mL Omnipaque 350  COMPARISON:  CT abdomen and pelvis 12/15/2013  FINDINGS: CTA CHEST FINDINGS  Examination is technically limited due to poor contrast bolus. The main and lobar pulmonary arteries are moderately well visualized and are free of large central pulmonary emboli. Segmental and peripheral arteries are not able to be adequately evaluated for pulmonary embolus.  Normal heart size. Normal caliber thoracic aorta. Great vessel origins are patent. There is residual  contrast material in the esophagus without dilatation. This may indicate reflux or dysmotility. No significant lymphadenopathy in the chest.  Evaluation of lungs is limited due to respiratory motion artifact. There appears to be dependent atelectasis in the lung bases. There is no evidence of focal consolidation. No pneumothorax. No pleural effusion.  CT ABDOMEN and PELVIS FINDINGS  The liver, spleen, gallbladder, pancreas, adrenal glands, kidneys, abdominal aorta, inferior vena cava, and retroperitoneal lymph nodes are unremarkable. Stomach, small bowel, and colon are not abnormally distended and no discrete wall thickening is appreciated. No free air or free fluid in the abdomen. Abdominal wall musculature appears intact.  Pelvis: Uterus and ovaries are not enlarged. No free or loculated pelvic fluid collections. No pelvic mass or lymphadenopathy. Appendix is normal. No evidence of diverticulitis. No destructive bone lesions.  Review of the MIP images confirms the above findings.  IMPRESSION: Technically limited study is indeterminate for evaluation of possible pulmonary embolus. No evidence of active pulmonary disease.  No acute process demonstrated in the abdomen or pelvis.   Electronically Signed   By: Lucienne Capers M.D.   On: 02/28/2014 03:07   CTA sub-optimal due to poor IV contrast bolus (suspected due to patient size).  No abnormal findings in CT abdomen/pelvis.  Patient remains tachycardic with rate in the 140's despite 3L of fluids.  Fever has increased from 99 to 102 despite multiple doses of tylenol and motrin.  Unclear source at this time.  On repeat evaluation, patient denies current chest pain or SOB.  Her abdominal pain is controlled.  Will obtain flu swab and blood cultures.  Feel that patient needs admission for further evaluation of her symptoms and persistent tachycardia without hypoxia-- patient agreeable to stay.  Case discussed with Dr. Ernestina Patches of [facility] who has evaluated  patient in the ED and will admit for further management.  Larene Pickett, PA-C 02/28/14 [phone]

## 2014-02-28 NOTE — Progress Notes (Signed)
ANTIBIOTIC CONSULT NOTE - INITIAL  Pharmacy Consult for vancomycin, levofloxacin, flagyl Indication: sepsis  Allergies  Allergen Reactions  . Aspirin     REACTION: Swelling  . Cephalosporins     Stomach cramps  . Ciprofloxacin     Stomach cramps  . Fish Allergy     Shortness of breath  . Nitrofurantoin Monohyd Macro     Stomach cramps  . Penicillins     Per pt: unknown  . Sulfa Antibiotics     Per pt: unknown    Patient Measurements: Height: 5' 4.17" (163 cm) Weight: 211 lb 10.3 oz (96 kg) IBW/kg (Calculated) : 55.1 Adjusted Body Weight:   Vital Signs: Temp: 100.1 F (37.8 C) (10/21 0448) Temp Source: Oral (10/21 0448) BP: 110/67 mmHg (10/21 0630) Pulse Rate: 104 (10/21 0630) Intake/Output from previous day: 10/20 0701 - 10/21 0700 In: 1000 [I.V.:1000] Out: -  Intake/Output from this shift: Total I/O In: 1000 [I.V.:1000] Out: -   Labs:  Recent Labs  02/27/14 2024 02/28/14 0539  WBC 11.0* 10.6*  HGB 13.4 11.8*  PLT 293 238  CREATININE 0.60 0.60   Estimated Creatinine Clearance: 91.8 ml/min (by C-G formula based on Cr of 0.6). No results found for this basename: VANCOTROUGH, VANCOPEAK, VANCORANDOM, GENTTROUGH, GENTPEAK, GENTRANDOM, TOBRATROUGH, TOBRAPEAK, TOBRARND, AMIKACINPEAK, AMIKACINTROU, AMIKACIN,  in the last 72 hours   Microbiology: No results found for this or any previous visit (from the past 720 hour(s)).  Medical History: Past Medical History  Diagnosis Date  . GERD (gastroesophageal reflux disease)   . Uterine fibroid   . Hydrosalpinx     Left and Right  . Cervical dysplasia     High Grade  . Endometrial polyp   . CIN I (cervical intraepithelial neoplasia I)   . Seasonal allergies   . Anxiety   . Asthma   . IBS (irritable bowel syndrome)   . Colon polyp 2015    TUBULAR ADENOMA    Medications:  Anti-infectives   Start     Dose/Rate Route Frequency Ordered Stop   02/28/14 1400  vancomycin (VANCOCIN) IVPB 1000 mg/200 mL premix      1,000 mg 200 mL/hr over 60 Minutes Intravenous Every 8 hours 02/28/14 0640     02/28/14 0600  metroNIDAZOLE (FLAGYL) IVPB 500 mg     500 mg 100 mL/hr over 60 Minutes Intravenous Every 8 hours 02/28/14 0454     02/28/14 0530  vancomycin (VANCOCIN) IVPB 1000 mg/200 mL premix     1,000 mg 200 mL/hr over 60 Minutes Intravenous  Once 02/28/14 0518     02/28/14 0500  levofloxacin (LEVAQUIN) IVPB 750 mg     750 mg 100 mL/hr over 90 Minutes Intravenous Daily 02/28/14 0454       Assessment: Patient with sepsis, concern for GI etiology.  First dose of antibiotics already ordered or given in ED.   Goal of Therapy:  Vancomycin trough level 15-20 mcg/ml Levofloxacin dosed based on patient weight and renal function Flagyl based on manufacturer dosing recommendations.  Plan:  Measure antibiotic drug levels at steady state Follow up culture results Vancomycin 1gm iv q8hr Levofloxacin 750mg  iv q24hr Flagyl 500mg  iv q8hr      Tyler Deis, Piotr Christopher Crowford 02/28/2014,6:42 AM

## 2014-02-28 NOTE — ED Notes (Signed)
Pt still in CT

## 2014-02-28 NOTE — ED Provider Notes (Signed)
Medical screening examination/treatment/procedure(s) were performed by non-physician practitioner and as supervising physician I was immediately available for consultation/collaboration.   EKG Interpretation   Date/Time:  Wednesday February 28 2014 01:56:52 EDT Ventricular Rate:  134 PR Interval:  160 QRS Duration: 78 QT Interval:  284 QTC Calculation: 424 R Axis:   41 Text Interpretation:  Sinus tachycardia Borderline T abnormalities,  anterior leads Confirmed by Annina Piotrowski  MD, Gloria Ricardo ([id number]) on 02/28/2014 1:59:34  AM       Kalman Drape, MD 02/28/14 [phone]

## 2014-02-28 NOTE — ED Notes (Signed)
Per Adela Lank (lab), urine culture and UDS may be added to urine in lab. Collection slips sent to lab at this time.

## 2014-02-28 NOTE — H&P (Signed)
Hospitalist Admission History and Physical  Patient name: Betty Bell Medical record number: [id number] Date of birth: 17-Jun-1960 Age: 53 y.o. Gender: female  Primary Care Provider: Scarlette Calico, MD  Chief Complaint: sepsis, abd pain, diarrhea, tachycardia   History of Present Illness:This is a 53 y.o. year old female with significant past medical history of GERD  presenting with sepsis, abd pain, diarrhea, tachycardia. Pt states that she has had upper abdominal pain over the past 2-3 days. States that sxs have been progressively worsening over the past 2-3 days. Has a baseline hx/o reflux that pt take OTC zantac for. Feels like sxs similar to previous GERD flares in the past-however sxs worse in intensity. Abd pain seemed to be worse with food. + assd nause and NBNB diarrhea. No fevers or chills prior to presentation. Denies any hx/o ETOH/drug abuse.  Presented to ER with predominant complaint of RUQ pain. CT abd and pelvis negative for any biliary-GI abnormality. Pt then spiked fever with persistent tachycardia. CTAngio of the chest done that was indeterminant for any PE. Pt denies any CP/SOB. Notable labs include WBC @11 . Otherwise lipase, LFTs, T bili, ALP WNL. Lactate 1.2. Family does report pt w/ + sick contact with similar sxs w/in past 1-2 weeks. Sick contact (child) diagnosed with viral GI illness per family member. UA minimally-mildly indicative of infection.  Vitals: tmax 102, HR 110s-140s, Resp 10s-20s, BP 120s-150s, Satting >93% on RA EKG : sinus tach-otherwise normal EKG  Assessment and Plan: Betty Bell is a 53 y.o. year old female presenting with sepsis, abd pain, diarrhea, tachycardia   Active Problems:   Sepsis   Abdominal pain, acute   Diarrhea   Tachycardia  1- Sepsis -Broad ddx, though higher concern for GI etiology -? Viral gastroenteritis given + sick contact with similar sxs -mild epigastric and RUQ TTP on exam  -high dose PPI  -no LFT, biliary  abnormalities on imaging or bloodwork  -biliary source (ie cholangitis, choledocholithiasis) less likely, though cannot be completely ruled out  -CXR/CT w/o infiltrate  -panculture -broad spectrum abx-multiple allergies-pharmacy to clarify (preferred regimen levaquin+ flagyl vs. vanc zosyn pending pharm inquiry) -stepdown bed   2- Abd Pain/diarrhea  -As above  -Broad ddx -no biliary disease on imaging/blood work  -broad spectrum abx -high dose PPI  -? Viral source given sick contact with similar sxs  -stool studies, C diff  -cont to follow closely    3- Tachycardia  -likely reactive in setting of sepsis  -sinus tach on EKG -trop neg x1 -cycle CEs  -indeterminate findings on CTA -PE less likely given no CP or hypoxia  -would consider d-dimer, but this may be falsely elevated in setting of sepsis -? VQ scan-defer to am team- treatment dose lovenox if pt develops hypoxia    FEN/GI: NPO for now  Prophylaxis: sub q heparin  Disposition: pending further evaluation  Code Status:Full Code    Patient Active Problem List   Diagnosis Date Noted  . Sepsis 02/28/2014  . Abdominal pain, acute 02/28/2014  . Diarrhea 02/28/2014  . Tachycardia 02/28/2014  . Abdominal pain, epigastric 12/14/2013  . Other screening mammogram 11/16/2013  . Panic attacks 11/16/2013  . Seasonal allergies   . Routine general medical examination at a health care facility 03/30/2011  . GERD 01/26/2008   Past Medical History: Past Medical History  Diagnosis Date  . GERD (gastroesophageal reflux disease)   . Uterine fibroid   . Hydrosalpinx     Left and Right  . Cervical dysplasia  High Grade  . Endometrial polyp   . CIN I (cervical intraepithelial neoplasia I)   . Seasonal allergies   . Anxiety   . Asthma   . IBS (irritable bowel syndrome)   . Colon polyp 2015    TUBULAR ADENOMA    Past Surgical History: Past Surgical History  Procedure Laterality Date  . Uterine fibroid embolization     . Pelvic laparoscopy      DL  . Dilation and curettage of uterus    . Hysteroscopy    . Laser ablation of the cervix    . Carpal tunnel release Left     x 2  . Wrist cystectomy Right   . Colonoscopy  2015    Social History: History   Social History  . Marital Status: Divorced    Spouse Name: N/A    Number of Children: 1  . Years of Education: N/A   Occupational History  . [employer]   Social History Main Topics  . Smoking status: Never Smoker   . Smokeless tobacco: Never Used     Comment: Regular Exercise - Yes  . Alcohol Use: No     Comment: rare  . Drug Use: No  . Sexual Activity: Yes    Birth Control/ Protection: Condom   Other Topics Concern  . None   Social History Narrative  . None    Family History: Family History  Problem Relation Age of Onset  . Diabetes Mother   . Hypertension Mother   . Diabetes Father   . Hypertension Father   . Prostate cancer Father   . Colon cancer Father 43  . Colon cancer Brother 42  . Cancer Brother     esophagus, skin  . Colon polyps Sister   . Hypertension Sister   . Kidney disease    . Lung cancer Brother   . Irritable bowel syndrome Sister   . Cancer - Other Brother     esophagal  . COPD Neg Hx   . Alcohol abuse Neg Hx   . Depression Neg Hx   . Drug abuse Neg Hx   . Early death Neg Hx   . Hearing loss Neg Hx   . Heart disease Neg Hx   . Hyperlipidemia Neg Hx   . Stroke Neg Hx     Allergies: Allergies  Allergen Reactions  . Aspirin     REACTION: Swelling  . Cephalosporins     Stomach cramps  . Ciprofloxacin     Stomach cramps  . Fish Allergy     Shortness of breath  . Nitrofurantoin Monohyd Macro     Stomach cramps  . Penicillins     Per pt: unknown  . Sulfa Antibiotics     Per pt: unknown    Current Facility-Administered Medications  Medication Dose Route Frequency Provider Last Rate Last Dose  . 0.9 %  sodium chloride infusion   Intravenous Continuous Shanda Howells, MD      . heparin injection 5,000 Units  5,000 Units Subcutaneous 3 times per day Shanda Howells, MD      . morphine 4 MG/ML injection 4 mg  4 mg Intravenous Once Hannah S Merrell, PA-C      . ondansetron [facility]) injection 4 mg  4 mg Intravenous Once Hannah S Merrell, PA-C      . pantoprazole (PROTONIX) injection 40 mg  40 mg Intravenous Q12H Shanda Howells, MD      . sodium chloride 0.9 %  injection 3 mL  3 mL Intravenous Q12H Shanda Howells, MD       Current Outpatient Prescriptions  Medication Sig Dispense Refill  . aspirin 81 MG tablet Take 81 mg by mouth once.      . ranitidine (ZANTAC) 150 MG tablet Take 150-300 mg by mouth 2 (two) times daily. 300mg  in the morning and 150mg  in the evening.       Review Of Systems: 12 point ROS negative except as noted above in HPI.  Physical Exam: Filed Vitals:   02/28/14 0448  BP: 123/63  Pulse: 116  Temp: 100.1 F (37.8 C)  Resp: 18    General: cooperative and mildly ill appearing  HEENT: PERRLA and extra ocular movement intact Heart: S1, S2 normal, no murmur, rub or gallop, regular rate and rhythm Lungs: clear to auscultation, no wheezes or rales and unlabored breathing Abdomen: + bowel sounds, Mild epigastric/RUQ TTP  Extremities: extremities normal, atraumatic, no cyanosis or edema Skin:no rashes Neurology: normal without focal findings  Labs and Imaging: Lab Results  Component Value Date/Time   NA 139 02/27/2014  8:24 PM   K 4.1 02/27/2014  8:24 PM   CL 101 02/27/2014  8:24 PM   CO2 22 02/27/2014  8:24 PM   BUN 8 02/27/2014  8:24 PM   CREATININE 0.60 02/27/2014  8:24 PM   GLUCOSE 105* 02/27/2014  8:24 PM   Lab Results  Component Value Date   WBC 11.0* 02/27/2014   HGB 13.4 02/27/2014   HCT 41.4 02/27/2014   MCV 81.3 02/27/2014   PLT 293 02/27/2014   Urinalysis    Component Value Date/Time   COLORURINE YELLOW 02/27/2014 2102   APPEARANCEUR CLEAR 02/27/2014 2102   LABSPEC 1.013 02/27/2014 2102   PHURINE 6.5  02/27/2014 2102   GLUCOSEU NEGATIVE 02/27/2014 2102   GLUCOSEU NEGATIVE 03/30/2011 1559   HGBUR TRACE* 02/27/2014 2102   BILIRUBINUR NEGATIVE 02/27/2014 2102   KETONESUR NEGATIVE 02/27/2014 2102   PROTEINUR NEGATIVE 02/27/2014 2102   UROBILINOGEN 1.0 02/27/2014 2102   NITRITE NEGATIVE 02/27/2014 2102   LEUKOCYTESUR NEGATIVE 02/27/2014 2102       Dg Chest 2 View  02/28/2014   CLINICAL DATA:  Upper abdominal pain and shortness of breath for 24 hr.  EXAM: CHEST  2 VIEW  COMPARISON:  04/1912  FINDINGS: The heart size and mediastinal contours are within normal limits. Both lungs are clear. The visualized skeletal structures are unremarkable.  IMPRESSION: No active cardiopulmonary disease.   Electronically Signed   By: Lucienne Capers M.D.   On: 02/28/2014 00:23   Ct Angio Chest Pe W/cm &/or Wo Cm  02/28/2014   CLINICAL DATA:  Tachycardia.  Right upper quadrant pain.  EXAM: CT ANGIOGRAPHY CHEST  CT ABDOMEN AND PELVIS WITH CONTRAST  TECHNIQUE: Multidetector CT imaging of the chest was performed using the standard protocol during bolus administration of intravenous contrast. Multiplanar CT image reconstructions and MIPs were obtained to evaluate the vascular anatomy. Multidetector CT imaging of the abdomen and pelvis was performed using the standard protocol during bolus administration of intravenous contrast.  CONTRAST:  100 mL Omnipaque 350  COMPARISON:  CT abdomen and pelvis 12/15/2013  FINDINGS: CTA CHEST FINDINGS  Examination is technically limited due to poor contrast bolus. The main and lobar pulmonary arteries are moderately well visualized and are free of large central pulmonary emboli. Segmental and peripheral arteries are not able to be adequately evaluated for pulmonary embolus.  Normal heart size. Normal caliber thoracic aorta. [employer]  vessel origins are patent. There is residual contrast material in the esophagus without dilatation. This may indicate reflux or dysmotility. No significant  lymphadenopathy in the chest.  Evaluation of lungs is limited due to respiratory motion artifact. There appears to be dependent atelectasis in the lung bases. There is no evidence of focal consolidation. No pneumothorax. No pleural effusion.  CT ABDOMEN and PELVIS FINDINGS  The liver, spleen, gallbladder, pancreas, adrenal glands, kidneys, abdominal aorta, inferior vena cava, and retroperitoneal lymph nodes are unremarkable. Stomach, small bowel, and colon are not abnormally distended and no discrete wall thickening is appreciated. No free air or free fluid in the abdomen. Abdominal wall musculature appears intact.  Pelvis: Uterus and ovaries are not enlarged. No free or loculated pelvic fluid collections. No pelvic mass or lymphadenopathy. Appendix is normal. No evidence of diverticulitis. No destructive bone lesions.  Review of the MIP images confirms the above findings.  IMPRESSION: Technically limited study is indeterminate for evaluation of possible pulmonary embolus. No evidence of active pulmonary disease.  No acute process demonstrated in the abdomen or pelvis.   Electronically Signed   By: Lucienne Capers M.D.   On: 02/28/2014 03:07   US Abdomen Complete  02/28/2014   CLINICAL DATA:  Right upper quadrant pain, fever, back pain, and diarrhea for 2 days. Elevated white cell count of 11.  EXAM: ULTRASOUND ABDOMEN COMPLETE  COMPARISON:  CT abdomen and pelvis 12/15/2013.  FINDINGS: Gallbladder: No gallstones or wall thickening visualized. No sonographic Murphy sign noted.  Common bile duct: Diameter: 6.3 mm, normal  Liver: No focal lesion identified. Within normal limits in parenchymal echogenicity.  IVC: No abnormality visualized.  Pancreas: Visualized portion unremarkable.  Spleen: Size and appearance within normal limits.  Right Kidney: Length: 11.4 cm. Echogenicity within normal limits. No mass or hydronephrosis visualized.  Left Kidney: Length: 10.9 cm. Echogenicity within normal limits. No mass or  hydronephrosis visualized.  Abdominal aorta: No aneurysm visualized.  Other findings: None.  IMPRESSION: No acute abnormalities demonstrated.   Electronically Signed   By: Lucienne Capers M.D.   On: 02/28/2014 00:07   Ct Abdomen Pelvis W Contrast  02/28/2014   CLINICAL DATA:  Tachycardia.  Right upper quadrant pain.  EXAM: CT ANGIOGRAPHY CHEST  CT ABDOMEN AND PELVIS WITH CONTRAST  TECHNIQUE: Multidetector CT imaging of the chest was performed using the standard protocol during bolus administration of intravenous contrast. Multiplanar CT image reconstructions and MIPs were obtained to evaluate the vascular anatomy. Multidetector CT imaging of the abdomen and pelvis was performed using the standard protocol during bolus administration of intravenous contrast.  CONTRAST:  100 mL Omnipaque 350  COMPARISON:  CT abdomen and pelvis 12/15/2013  FINDINGS: CTA CHEST FINDINGS  Examination is technically limited due to poor contrast bolus. The main and lobar pulmonary arteries are moderately well visualized and are free of large central pulmonary emboli. Segmental and peripheral arteries are not able to be adequately evaluated for pulmonary embolus.  Normal heart size. Normal caliber thoracic aorta. Great vessel origins are patent. There is residual contrast material in the esophagus without dilatation. This may indicate reflux or dysmotility. No significant lymphadenopathy in the chest.  Evaluation of lungs is limited due to respiratory motion artifact. There appears to be dependent atelectasis in the lung bases. There is no evidence of focal consolidation. No pneumothorax. No pleural effusion.  CT ABDOMEN and PELVIS FINDINGS  The liver, spleen, gallbladder, pancreas, adrenal glands, kidneys, abdominal aorta, inferior vena cava, and retroperitoneal lymph nodes are  unremarkable. Stomach, small bowel, and colon are not abnormally distended and no discrete wall thickening is appreciated. No free air or free fluid in the  abdomen. Abdominal wall musculature appears intact.  Pelvis: Uterus and ovaries are not enlarged. No free or loculated pelvic fluid collections. No pelvic mass or lymphadenopathy. Appendix is normal. No evidence of diverticulitis. No destructive bone lesions.  Review of the MIP images confirms the above findings.  IMPRESSION: Technically limited study is indeterminate for evaluation of possible pulmonary embolus. No evidence of active pulmonary disease.  No acute process demonstrated in the abdomen or pelvis.   Electronically Signed   By: Lucienne Capers M.D.   On: 02/28/2014 03:07           Shanda Howells MD  Pager: [phone]

## 2014-02-28 NOTE — ED Notes (Signed)
PA at bedside.

## 2014-02-28 NOTE — ED Notes (Signed)
Pt denies feeling as if her heart is racing. She states  "I feel better than when I came in".

## 2014-02-28 NOTE — ED Notes (Signed)
She reports she has had about 3 episodes of diarrhea since she has been here.

## 2014-02-28 NOTE — Progress Notes (Signed)
Patient seen and examined. Admitted after midnight secondary to fever, abd pain, diarrhea and nausea. Patient was tachycardic and with SIRS/early sepsis pictures; Appears to be secondary to viral gastroenteritis and dehydration. Patient responded wonderful to fluid resuscitation and on my exam is not longer febrile, HR WNL and no complaining of nausea or vomiting. Please referred to Dr. Ernestina Patches H&P for further info/details on admission.  Plan: -transfer to telemetry bed -replete electrolytes -follow blood work, respiratory panel and cx's -advance diet -follow clinical response  Barton Dubois [phone]

## 2014-02-28 NOTE — ED Notes (Signed)
Pt in CT.

## 2014-02-28 NOTE — ED Provider Notes (Signed)
CSN: [id number]     Arrival date & time 02/27/14  1957 History   First MD Initiated Contact with Patient 02/27/14 2048     Chief Complaint  Patient presents with  . Abdominal Pain     (Consider location/radiation/quality/duration/timing/severity/associated sxs/prior Treatment) HPI Comments: Patient 53 year old female with history of GERD, fibroids, hydrosalpinx, IBS who presents the emergency department today for evaluation of right-sided abdominal pain. She reports that the pain began yesterday it has gradually worsened. The pain is worse when she eats. She has had associated diarrhea. She reports her diarrhea is watery. Her abdominal pain feels slightly improved after having a bowel movement. She has associated fever and chills. She has had mild cough. She denies chest pain and shortness of breath.   The history is provided by the patient. No language interpreter was used.    Past Medical History  Diagnosis Date  . GERD (gastroesophageal reflux disease)   . Uterine fibroid   . Hydrosalpinx     Left and Right  . Cervical dysplasia     High Grade  . Endometrial polyp   . CIN I (cervical intraepithelial neoplasia I)   . Seasonal allergies   . Anxiety   . Asthma   . IBS (irritable bowel syndrome)   . Colon polyp 2015    TUBULAR ADENOMA   Past Surgical History  Procedure Laterality Date  . Uterine fibroid embolization    . Pelvic laparoscopy      DL  . Dilation and curettage of uterus    . Hysteroscopy    . Laser ablation of the cervix    . Carpal tunnel release Left     x 2  . Wrist cystectomy Right   . Colonoscopy  2015   Family History  Problem Relation Age of Onset  . Diabetes Mother   . Hypertension Mother   . Diabetes Father   . Hypertension Father   . Prostate cancer Father   . Colon cancer Father 28  . Colon cancer Brother 61  . Cancer Brother     esophagus, skin  . Colon polyps Sister   . Hypertension Sister   . Kidney disease    . Lung cancer Brother    . Irritable bowel syndrome Sister   . Cancer - Other Brother     esophagal  . COPD Neg Hx   . Alcohol abuse Neg Hx   . Depression Neg Hx   . Drug abuse Neg Hx   . Early death Neg Hx   . Hearing loss Neg Hx   . Heart disease Neg Hx   . Hyperlipidemia Neg Hx   . Stroke Neg Hx    History  Substance Use Topics  . Smoking status: Never Smoker   . Smokeless tobacco: Never Used     Comment: Regular Exercise - Yes  . Alcohol Use: No     Comment: rare   OB History   Grav Para Term Preterm Abortions TAB SAB Ect Mult Living   1    1          Review of Systems  Constitutional: Positive for fever and chills.  Respiratory: Positive for cough. Negative for shortness of breath.   Cardiovascular: Negative for chest pain and leg swelling.  Gastrointestinal: Positive for nausea, abdominal pain and diarrhea. Negative for vomiting.  All other systems reviewed and are negative.     Allergies  Aspirin; Cephalosporins; Ciprofloxacin; Fish allergy; Nitrofurantoin monohyd macro; Penicillins; and Sulfa antibiotics  Home Medications   Prior to Admission medications   Medication Sig Start Date End Date Taking? Authorizing Provider  aspirin 81 MG tablet Take 81 mg by mouth once.   Yes Historical Provider, MD  ranitidine (ZANTAC) 150 MG tablet Take 150-300 mg by mouth 2 (two) times daily. 300mg  in the morning and 150mg  in the evening.   Yes Historical Provider, MD   BP 138/64  Pulse 127  Temp(Src) 100 F (37.8 C) (Oral)  Resp 18  SpO2 97%  LMP 03/26/2011 Physical Exam  Nursing note and vitals reviewed. Constitutional: She is oriented to person, place, and time. She appears well-developed and well-nourished. No distress.  HENT:  Head: Normocephalic and atraumatic.  Right Ear: External ear normal.  Left Ear: External ear normal.  Nose: Nose normal.  Mouth/Throat: Oropharynx is clear and moist.  Eyes: Conjunctivae are normal.  Neck: Normal range of motion.  Cardiovascular: Regular  rhythm and normal heart sounds.  Tachycardia present.   Pulmonary/Chest: Effort normal and breath sounds normal. No stridor. No respiratory distress. She has no wheezes. She has no rales.  Abdominal: Soft. She exhibits no distension. There is tenderness in the right upper quadrant.  Musculoskeletal: Normal range of motion.  Neurological: She is alert and oriented to person, place, and time. She has normal strength.  Skin: Skin is warm and dry. She is not diaphoretic. No erythema.  Psychiatric: She has a normal mood and affect. Her behavior is normal.    ED Course  Procedures (including critical care time) Labs Review Labs Reviewed  CBC WITH DIFFERENTIAL - Abnormal; Notable for the following:    WBC 11.0 (*)    Neutrophils Relative % 84 (*)    Neutro Abs 9.3 (*)    Lymphocytes Relative 9 (*)    All other components within normal limits  COMPREHENSIVE METABOLIC PANEL - Abnormal; Notable for the following:    Glucose, Bld 105 (*)    Alkaline Phosphatase 118 (*)    Anion gap 16 (*)    All other components within normal limits  URINALYSIS, ROUTINE W REFLEX MICROSCOPIC - Abnormal; Notable for the following:    Hgb urine dipstick TRACE (*)    All other components within normal limits  URINE MICROSCOPIC-ADD ON - Abnormal; Notable for the following:    Squamous Epithelial / LPF FEW (*)    Bacteria, UA FEW (*)    All other components within normal limits  LIPASE, BLOOD  I-STAT TROPOININ, ED  I-STAT CG4 LACTIC ACID, ED    Imaging Review Dg Chest 2 View  02/28/2014   CLINICAL DATA:  Upper abdominal pain and shortness of breath for 24 hr.  EXAM: CHEST  2 VIEW  COMPARISON:  04/1912  FINDINGS: The heart size and mediastinal contours are within normal limits. Both lungs are clear. The visualized skeletal structures are unremarkable.  IMPRESSION: No active cardiopulmonary disease.   Electronically Signed   By: Lucienne Capers M.D.   On: 02/28/2014 00:23   US Abdomen Complete  02/28/2014    CLINICAL DATA:  Right upper quadrant pain, fever, back pain, and diarrhea for 2 days. Elevated white cell count of 11.  EXAM: ULTRASOUND ABDOMEN COMPLETE  COMPARISON:  CT abdomen and pelvis 12/15/2013.  FINDINGS: Gallbladder: No gallstones or wall thickening visualized. No sonographic Murphy sign noted.  Common bile duct: Diameter: 6.3 mm, normal  Liver: No focal lesion identified. Within normal limits in parenchymal echogenicity.  IVC: No abnormality visualized.  Pancreas: Visualized portion unremarkable.  Spleen:  Size and appearance within normal limits.  Right Kidney: Length: 11.4 cm. Echogenicity within normal limits. No mass or hydronephrosis visualized.  Left Kidney: Length: 10.9 cm. Echogenicity within normal limits. No mass or hydronephrosis visualized.  Abdominal aorta: No aneurysm visualized.  Other findings: None.  IMPRESSION: No acute abnormalities demonstrated.   Electronically Signed   By: Lucienne Capers M.D.   On: 02/28/2014 00:07     EKG Interpretation   Date/Time:  Tuesday February 27 2014 21:45:05 EDT Ventricular Rate:  126 PR Interval:  154 QRS Duration: 79 QT Interval:  300 QTC Calculation: 434 R Axis:   56 Text Interpretation:  Sinus tachycardia Otherwise normal ECG Confirmed by  GOLDSTON  MD, SCOTT ([id number]) on 02/27/2014 10:07:07 PM      MDM   Final diagnoses:  RUQ pain  Tachycardia  Fever, unspecified fever cause   Patient presents emergency department for evaluation of right upper quadrant pain and diarrhea. Patient is febrile and tachycardic. Elevated white blood cell count of 11.0. Patient is persistently tachycardic despite antipyretics and fluids. Patient's pain is improving. Ultrasound of abdomen is unremarkable. CT angio of chest and CT abdomen and pelvis is pending. Patient signed out to [place], PA-C at change of shift for further management.     Elwyn Lade, PA-C 02/28/14 1323

## 2014-03-01 DIAGNOSIS — R1011 Right upper quadrant pain: Secondary | ICD-10-CM

## 2014-03-01 DIAGNOSIS — R1013 Epigastric pain: Secondary | ICD-10-CM

## 2014-03-01 DIAGNOSIS — E860 Dehydration: Secondary | ICD-10-CM

## 2014-03-01 DIAGNOSIS — K219 Gastro-esophageal reflux disease without esophagitis: Secondary | ICD-10-CM

## 2014-03-01 DIAGNOSIS — E876 Hypokalemia: Secondary | ICD-10-CM

## 2014-03-01 DIAGNOSIS — A084 Viral intestinal infection, unspecified: Secondary | ICD-10-CM

## 2014-03-01 LAB — COMPREHENSIVE METABOLIC PANEL
ALT: 12 U/L (ref 0–35)
AST: 18 U/L (ref 0–37)
Albumin: 2.8 g/dL — ABNORMAL LOW (ref 3.5–5.2)
Alkaline Phosphatase: 81 U/L (ref 39–117)
Anion gap: 12 (ref 5–15)
BUN: 4 mg/dL — ABNORMAL LOW (ref 6–23)
CO2: 19 mEq/L (ref 19–32)
Calcium: 8.2 mg/dL — ABNORMAL LOW (ref 8.4–10.5)
Chloride: 106 mEq/L (ref 96–112)
Creatinine, Ser: 0.61 mg/dL (ref 0.50–1.10)
GFR calc Af Amer: >90 mL/min (ref >90)
GFR calc non Af Amer: >90 mL/min (ref >90)
Glucose, Bld: 105 mg/dL — ABNORMAL HIGH (ref 70–99)
Potassium: 3.3 mEq/L — ABNORMAL LOW (ref 3.7–5.3)
Sodium: 137 mEq/L (ref 137–147)
Total Bilirubin: 0.3 mg/dL (ref 0.3–1.2)
Total Protein: 5.9 g/dL — ABNORMAL LOW (ref 6.0–8.3)

## 2014-03-01 LAB — URINE CULTURE
Colony Count: NO GROWTH
Culture: NO GROWTH

## 2014-03-01 LAB — CBC WITH DIFFERENTIAL/PLATELET
Basophils Absolute: 0 10*3/uL (ref 0.0–0.1)
Basophils Relative: 0 % (ref 0–1)
Eosinophils Absolute: 0 10*3/uL (ref 0.0–0.7)
Eosinophils Relative: 0 % (ref 0–5)
HCT: 33.7 % — ABNORMAL LOW (ref 36.0–46.0)
Hemoglobin: 10.9 g/dL — ABNORMAL LOW (ref 12.0–15.0)
Lymphocytes Relative: 16 % (ref 12–46)
Lymphs Abs: 1.9 10*3/uL (ref 0.7–4.0)
MCH: 26.7 pg (ref 26.0–34.0)
MCHC: 32.3 g/dL (ref 30.0–36.0)
MCV: 82.6 fL (ref 78.0–100.0)
Monocytes Absolute: 0.7 10*3/uL (ref 0.1–1.0)
Monocytes Relative: 6 % (ref 3–12)
Neutro Abs: 9.8 10*3/uL — ABNORMAL HIGH (ref 1.7–7.7)
Neutrophils Relative %: 78 % — ABNORMAL HIGH (ref 43–77)
Platelets: 207 10*3/uL (ref 150–400)
RBC: 4.08 MIL/uL (ref 3.87–5.11)
RDW: 14.4 % (ref 11.5–15.5)
WBC: 12.4 10*3/uL — ABNORMAL HIGH (ref 4.0–10.5)

## 2014-03-01 LAB — FECAL LACTOFERRIN, QUANT: Fecal Lactoferrin: POSITIVE

## 2014-03-01 MED ORDER — MAGNESIUM SULFATE 40 MG/ML IJ SOLN
2.0000 g | Freq: Once | INTRAMUSCULAR | Status: AC
  Administered 2014-03-01: 2 g via INTRAVENOUS
  Filled 2014-03-01: qty 50

## 2014-03-01 MED ORDER — METRONIDAZOLE 500 MG PO TABS: 500.0000 mg | ORAL_TABLET | Freq: Three times a day (TID) | ORAL | Status: DC

## 2014-03-01 MED ORDER — LEVOFLOXACIN 500 MG PO TABS: 500.0000 mg | ORAL_TABLET | Freq: Every day | ORAL | Status: DC

## 2014-03-01 MED ORDER — SACCHAROMYCES BOULARDII 250 MG PO CAPS: 250.0000 mg | ORAL_CAPSULE | Freq: Two times a day (BID) | ORAL | Status: DC

## 2014-03-01 MED ORDER — CETYLPYRIDINIUM CHLORIDE 0.05 % MT LIQD
7.0000 mL | Freq: Two times a day (BID) | OROMUCOSAL | Status: DC
  Administered 2014-03-01: 7 mL via OROMUCOSAL

## 2014-03-01 MED ORDER — CHLORHEXIDINE GLUCONATE 0.12 % MT SOLN
15.0000 mL | Freq: Two times a day (BID) | OROMUCOSAL | Status: DC
  Administered 2014-03-01: 15 mL via OROMUCOSAL
  Filled 2014-03-01 (×3): qty 15

## 2014-03-01 MED ORDER — POTASSIUM CHLORIDE CRYS ER 20 MEQ PO TBCR
40.0000 meq | EXTENDED_RELEASE_TABLET | ORAL | Status: DC
  Administered 2014-03-01: 40 meq via ORAL
  Filled 2014-03-01 (×2): qty 2

## 2014-03-01 NOTE — Discharge Instructions (Signed)
Potassium Salts tablets, extended-release tablets or capsules  What is this medicine?  POTASSIUM (poe TASS i um) is a natural salt that is important for the heart, muscles, and nerves. It is found in many foods and is normally supplied by a well balanced diet. This medicine is used to treat low potassium.  This medicine may be used for other purposes; ask your health care provider or pharmacist if you have questions.  COMMON BRAND NAME(S): ED-K+10, Glu-K, K-10, K-8, K-Dur, K-Tab, Kaon-CL, Klor-Con, Klor-Con M10, Klor-Con M15, Klor-Con M20, Klotrix, Micro-K, Micro-K Extencaps, Slow-K  What should I tell my health care provider before I take this medicine?  They need to know if you have any of these conditions:  -dehydration  -diabetes  -irregular heartbeat  -kidney disease  -stomach ulcers or other stomach problems  -an unusual or allergic reaction to potassium salts, other medicines, foods, dyes, or preservatives  -pregnant or trying to get pregnant  -breast-feeding  How should I use this medicine?  Take this medicine by mouth with a full glass of water. Follow the directions on the prescription label. Take with food. Do not suck on, crush, or chew this medicine. If you have difficulty swallowing, ask the pharmacist how to take. Take your medicine at regular intervals. Do not take it more often than directed. Do not stop taking except on your doctor's advice.  Talk to your pediatrician regarding the use of this medicine in children. Special care may be needed.  Overdosage: If you think you have taken too much of this medicine contact a poison control center or emergency room at once.  NOTE: This medicine is only for you. Do not share this medicine with others.  What if I miss a dose?  If you miss a dose, take it as soon as you can. If it is almost time for your next dose, take only that dose. Do not take double or extra doses.  What may interact with this medicine?  Do not take this medicine  with any of the following medications:  -eplerenone  -sodium polystyrene sulfonate  This medicine may also interact with the following medications:  -medicines for blood pressure or heart disease like lisinopril, losartan, quinapril, valsartan  -medicines for cold or allergies  -medicines for inflammation like ibuprofen, indomethacin  -medicines for Parkinson's disease  -medicines for the stomach like metoclopramide, dicyclomine, glycopyrrolate  -some diuretics  This list may not describe all possible interactions. Give your health care provider a list of all the medicines, herbs, non-prescription drugs, or dietary supplements you use. Also tell them if you smoke, drink alcohol, or use illegal drugs. Some items may interact with your medicine.  What should I watch for while using this medicine?  Visit your doctor or health care professional for regular check ups. You will need lab work done regularly.  You may need to be on a special diet while taking this medicine. Ask your doctor.  What side effects may I notice from receiving this medicine?  Side effects that you should report to your doctor or health care professional as soon as possible:  -allergic reactions like skin rash, itching or hives, swelling of the face, lips, or tongue  -black, tarry stools  -heartburn  -irregular heartbeat  -numbness or tingling in hands or feet  -pain when swallowing  -unusually weak or tired  Side effects that usually do not require medical attention (report to your doctor or health care professional if they continue or are bothersome):  -  diarrhea  -nausea  -stomach gas  -vomiting  This list may not describe all possible side effects. Call your doctor for medical advice about side effects. You may report side effects to FDA at 1-800-FDA-1088.  Where should I keep my medicine?  Keep out of the reach of children.  Store at room temperature between 15 and 30 degrees C (59 and 86 degrees F ). Keep bottle  closed tightly to protect this medicine from light and moisture. Throw away any unused medicine after the expiration date.  NOTE: This sheet is a summary. It may not cover all possible information. If you have questions about this medicine, talk to your doctor, pharmacist, or health care provider.   2015, [employer] Standard. (2007-07-13 11:17:31)  Magnesium Sulfate injection What is this medicine? MAGNESIUM SULFATE (mag NEE zee um SUL fate) is an electrolyte injection commonly used to treat low magnesium levels in your blood and to prevent or control certain seizures. This medicine may be used for other purposes; ask your health care provider or pharmacist if you have questions. What should I tell my health care provider before I take this medicine? They need to know if you have any of these conditions: -heart disease -history of irregular heart beat -kidney disease -an unusual or allergic reaction to magnesium sulfate, medicines, foods, dyes, or preservatives -pregnant or trying to get pregnant -breast-feeding How should I use this medicine? This medicine is for infusion into a vein. It is given by a health care professional in a hospital or clinic setting. Talk to your pediatrician regarding the use of this medicine in children. While this drug may be prescribed for selected conditions, precautions do apply. Overdosage: If you think you have taken too much of this medicine contact a poison control center or emergency room at once. NOTE: This medicine is only for you. Do not share this medicine with others. What if I miss a dose? This does not apply. What may interact with this medicine? This medicine may interact with the following medications: -certain medicines for anxiety or sleep -certain medicines for seizures like phenobarbital -digoxin -medicines that relax muscles for surgery -narcotic medicines for pain This list may not describe all possible interactions. Give your  health care provider a list of all the medicines, herbs, non-prescription drugs, or dietary supplements you use. Also tell them if you smoke, drink alcohol, or use illegal drugs. Some items may interact with your medicine. What should I watch for while using this medicine? Your condition will be monitored carefully while you are receiving this medicine. You may need blood work done while you are receiving this medicine. What side effects may I notice from receiving this medicine? Side effects that you should report to your doctor or health care professional as soon as possible: -allergic reactions like skin rash, itching or hives, swelling of the face, lips, or tongue -facial flushing -muscle weakness -signs and symptoms of low blood pressure like dizziness; feeling faint or lightheaded, falls; unusually weak or tired -signs and symptoms of a dangerous change in heartbeat or heart rhythm like chest pain; dizziness; fast or irregular heartbeat; palpitations; breathing problems -sweating This list may not describe all possible side effects. Call your doctor for medical advice about side effects. You may report side effects to FDA at 1-800-FDA-1088. Where should I keep my medicine? This drug is given in a hospital or clinic and will not be stored at home. NOTE: This sheet is a summary. It may not cover all possible  information. If you have questions about this medicine, talk to your doctor, pharmacist, or health care provider.  2015, [employer]/Gold Standard. (2012-09-05 10:35:11)  Magnesium This is a blood test which measures the amount of magnesium in your blood. Most of the magnesium in your body exists in your cells. However, the magnesium in your blood is important for many processes. It is important for your nerves to be able to conduct electrical energy. This is important in heart patients with higher heartbeats. When your heart beats and then gets ready to beat again, it repolarizes. If the  magnesium levels are low or high, it can affect the accuracy of the repolarization process. The magnesium levels are also monitored during pregnancy. This is done to determine if the expectant mother may have preeclampsia or toxemia. Magnesium is often used for treatment of these problems. PREPARATION FOR TEST No preparation or fasting is needed. A blood sample may be taken by inserting a needle into a vein in the arm.  NORMAL FINDINGS  Adult: 1.3 to 2.1 mEq/L or 0.65 to 1.05 mmol/L (SI units)  Child: 1.4 to 1.7 mEq/L  Newborn: 1.4 to 2 mEq/L Possible critical values: less than 0.5 mEq/L or greater than 3 mEq/L Ranges for normal findings may vary among different laboratories and hospitals. You should always check with your caregiver after having lab work or other tests done to discuss the meaning of your test results and whether your values are considered within normal limits. MEANING OF TEST  Your caregiver will go over the test results with you. Your caregiver will discuss the importance and meaning of your results. He or she will also discuss treatment options and additional tests, if needed. OBTAINING THE TEST RESULTS It is your responsibility to obtain your test results. Ask the lab or department performing the test when and how you will get your results. Document Released: 05/30/2004 Document Revised: 07/20/2011 Document Reviewed: 04/07/2008 [facility] Patient Information 2015 [place], [place]. This information is not intended to replace advice given to you by your health care provider. Make sure you discuss any questions you have with your health care provider. Levofloxacin injection What is this medicine? LEVOFLOXACIN (lee voe FLOX a sin) is a quinolone antibiotic. It is used to treat certain kinds of bacterial infections. It will not work for colds, flu, or other viral infections. This medicine may be used for other purposes; ask your health care provider or pharmacist if you have  questions. COMMON BRAND NAME(S): Levaquin What should I tell my health care provider before I take this medicine? They need to know if you have any of these conditions: -bone problems -cerebral disease -history of low levels of potassium in the blood -irregular heartbeat -joint problems -kidney disease -myasthenia gravis -seizure disorder -tendon problems -an unusual or allergic reaction to levofloxacin, other quinolone antibiotics, foods, dyes, or preservatives -pregnant or trying to get pregnant -breast-feeding How should I use this medicine? This medicine is for infusion into a vein. It is usually given by a health care professional in a hospital or clinic setting. If you get this medicine at home, you will be taught how to prepare and give this medicine. Use exactly as directed. Take your medicine at regular intervals. Do not take your medicine more often than directed. It is important that you put your used needles and syringes in a special sharps container. Do not put them in a trash can. If you do not have a sharps container, call your pharmacist or healthcare provider to  get one. A special MedGuide will be given to you by the pharmacist with each prescription and refill. Be sure to read this information carefully each time. Talk to your pediatrician regarding the use of this medicine in children. While this drug may be prescribed for children as young as 6 months for selected conditions, precautions do apply. Overdosage: If you think you have taken too much of this medicine contact a poison control center or emergency room at once. NOTE: This medicine is only for you. Do not share this medicine with others. What if I miss a dose? If you miss a dose, use it as soon as you can. If it is almost time for your next dose, use only that dose. Do not use double or extra doses. What may interact with this medicine? Do not take this medicine with any of the following medications - arsenic  trioxide - chloroquine - droperidol - medications for irregular rhythm like amiodarone, disopyramide, dofetilide, flecainide, quinidine, procainamide, sotalol - some medicines for depression or mental problems like phenothiazines, pimozide, and ziprasidone This medicine may also interact with the following medications - amoxapine - birth control pills - cisapride - haloperidol -NSAIDS, medicines for pain and inflammation, like ibuprofen or naproxen - retinoid products like tretinoin or isotretinoin - risperidone - some other antibiotics like clarithromycin or erythromycin - theophylline - warfarin This list may not describe all possible interactions. Give your health care provider a list of all the medicines, herbs, non-prescription drugs, or dietary supplements you use. Also tell them if you smoke, drink alcohol, or use illegal drugs. Some items may interact with your medicine. What should I watch for while using this medicine? Tell your doctor or health care professional if your symptoms do not begin to improve in a few days. Drink several glasses of water a day and cut down on drinks that contain caffeine. You must not get dehydrated while taking this medicine. You may get drowsy or dizzy. Do not drive, use machinery, or do anything that needs mental alertness until you know how this medicine affects you. Do not sit or stand up quickly, especially if you are an older patient. This reduces the risk of dizzy or fainting spells. This medicine can make you more sensitive to the sun. Keep out of the sun. If you cannot avoid being in the sun, wear protective clothing and use a sunscreen. Do not use sun lamps or tanning beds/booths. Contact your doctor if you get a sunburn. If you are a diabetic monitor your blood glucose carefully. If you get an unusual reading stop taking this medicine and call your doctor right away. Do not treat diarrhea with over-the-counter products. Contact your  doctor if you have diarrhea that lasts more than 2 days or if the diarrhea is severe and watery. What side effects may I notice from receiving this medicine? Side effects that you should report to your doctor or health care professional as soon as possible: -allergic reactions like skin rash or hives, swelling of the face, lips, or tongue -changes in vision -confusion, nightmares or hallucinations -difficulty breathing -irregular heartbeat, chest pain -joint, muscle or tendon pain -pain or difficulty passing urine -persistent headache with or without blurred vision -redness, blistering, peeling or loosening of the skin, including inside the mouth< -seizures -unusual pain, numbness, tingling, or weakness -vaginal irritation, discharge Side effects that usually do not require medical attention (report to your doctor or health care professional if they continue or are bothersome): -diarrhea -dry mouth -  headache -pain, irritation at the site of injection -stomach upset, nausea -trouble sleeping This list may not describe all possible side effects. Call your doctor for medical advice about side effects. You may report side effects to FDA at 1-800-FDA-1088. Where should I keep my medicine? Keep out of the reach of children. If you are using this medicine at home, you will be instructed on how to store this medicine. Throw away any unused medicine after the expiration date on the label. NOTE: This sheet is a summary. It may not cover all possible information. If you have questions about this medicine, talk to your doctor, pharmacist, or health care provider.  2015, Elsevier/Gold Standard. (2012-12-29 10:54:33)  Metronidazole injection What is this medicine? METRONIDAZOLE (me troe NI da zole) is an antiinfective. It medicine is used to treat or prevent certain kinds of bacterial and protozoal infections. It will not work for colds, flu, or other viral infections. This medicine may be used  for other purposes; ask your health care provider or pharmacist if you have questions. COMMON BRAND NAME(S): Flagyl RTU What should I tell my health care provider before I take this medicine? They need to know if you have any of these conditions: -anemia or other blood disorders -disease of the nervous system -fungal or yeast infection -history of edema, swelling -if you drink alcohol containing drinks -liver disease -seizures -an unusual or allergic reaction to metronidazole, or other medicines, foods, dyes, or preservatives -pregnant or trying to get pregnant -breast-feeding How should I use this medicine? This medicine is for infusion into a vein. It is usually given by a health care professional in a hospital or clinic setting. If you get this medicine at home, you will be taught how to prepare and give this medicine. Use exactly as directed. Take your medicine at regular intervals. Do not take your medicine more often than directed. It is important that you put your used needles and syringes in a special sharps container. Do not put them in a trash can. If you do not have a sharps container, call your pharmacist or healthcare provider to get one. Talk to your pediatrician regarding the use of this medicine in children. Special care may be needed. Overdosage: If you think you have taken too much of this medicine contact a poison control center or emergency room at once. NOTE: This medicine is only for you. Do not share this medicine with others. What if I miss a dose? If you miss a dose, take it as soon as you can. If it is almost time for your next dose, take only that dose. Do not take double or extra doses. What may interact with this medicine? Do not take this medicine with any of the following medications: -alcohol or any product that contains alcohol -amprenavir oral solution -cisapride -disulfiram -dofetilide -dronedarone -paclitaxel injection -pimozide -ritonavir oral  solution -sertraline oral solution -sulfamethoxazole-trimethoprim injection -thioridazine -ziprasidone This medicine may also interact with the following medications: -birth control pills -cimetidine -lithium -other medicines that prolong the QT interval (cause an abnormal heart rhythm) -phenobarbital -phenytoin -warfarin This list may not describe all possible interactions. Give your health care provider a list of all the medicines, herbs, non-prescription drugs, or dietary supplements you use. Also tell them if you smoke, drink alcohol, or use illegal drugs. Some items may interact with your medicine. What should I watch for while using this medicine? Tell your doctor or health care professional if your symptoms do not improve or if they  get worse. You may get drowsy or dizzy. Do not drive, use machinery, or do anything that needs mental alertness until you know how this medicine affects you. Do not stand or sit up quickly, especially if you are an older patient. This reduces the risk of dizzy or fainting spells. Avoid alcoholic drinks while you are taking this medicine and for three days afterward. Alcohol may make you feel dizzy, sick, or flushed. If you are being treated for a sexually transmitted disease, avoid sexual contact until you have finished your treatment. Your sexual partner may also need treatment. What side effects may I notice from receiving this medicine? Side effects that you should report to your doctor or health care professional as soon as possible: -allergic reactions like skin rash or hives, swelling of the face, lips, or tongue -confusion, clumsiness -difficulty speaking -discolored or sore mouth -dizziness -fever, infection -numbness, tingling, pain or weakness -trouble passing urine or change in the amount of urine -redness, blistering, peeling or loosening of the skin, including inside the mouth -seizures -unusual bleeding, bruising -unusually weak or  tired -vaginal irritation, dryness, or discharge Side effects that usually do not require medical attention (report to your doctor or health care professional if they continue or are bothersome): -dark urine -diarrhea -headache -irritability -metallic taste -nausea -pain, irritation where injected -stomach pain, cramps -trouble sleeping This list may not describe all possible side effects. Call your doctor for medical advice about side effects. You may report side effects to FDA at 1-800-FDA-1088. Where should I keep my medicine? Keep out of the reach of children. If you are using this medicine at home, you will be instructed on how to store this medicine. Throw away any unused medicine after the expiration date on the label. NOTE: This sheet is a summary. It may not cover all possible information. If you have questions about this medicine, talk to your doctor, pharmacist, or health care provider.  2015, [employer] Standard. (2012-12-02 14:06:56)

## 2014-03-01 NOTE — Discharge Summary (Signed)
Physician Discharge Summary  Betty Bell GUR:[id number] DOB: 30-Nov-1960 DOA: 02/27/2014  PCP: Scarlette Calico, MD  Admit date: 02/27/2014 Discharge date: 03/01/2014  Time spent: >30 minutes  Recommendations for Outpatient Follow-up:  Check BMET to follow electrolytes and renal function Reassess symptoms resolution  Discharge Diagnoses:  Sepsis picture due to viral gastroenteritis most likely Abdominal pain, acute Diarrhea Tachycardia Hypokalemia   Discharge Condition: stable and improved. Discharged home  Diet recommendation: regular diet  Filed Weights   02/28/14 0639 03/01/14 0502  Weight: 96 kg (211 lb 10.3 oz) 97.614 kg (215 lb 3.2 oz)    History of present illness:  53 year old female with history of GERD, fibroids, hydrosalpinx, IBS who presents the emergency department today for evaluation of right-sided abdominal pain. She reports that the pain began yesterday it has gradually worsened. The pain is worse when she eats. She has had associated diarrhea. She reports her diarrhea is watery. Her abdominal pain feels slightly improved after having a bowel movement. She has associated fever and chills. She has had mild cough. She denies chest pain and shortness of breath.   Hospital Course:  1-early sepsis presentation: true etiology on clear, but clear with resuscitation/supportive therapy -appears to be secondary to gastroenteritis  -C. Diff neg -final stool test pending -at this moment, no signs of systemic infection and patient wanting to go home -will discharge on flagyl and levaquin -also will use florastor BID -advise to keep herself well hydrated and to follow with PCP in 10 days  2-Hypokalemia: due to poor oral intake and diarrhea -repleted   3-GERD: will continue Zantac  4-tachycardia: due to dehydration and hypovolemia -resolved with IVF's -neg troponin  Procedures:  See below for x-ray reports   Consultations:  None   Discharge Exam: Filed  Vitals:   03/01/14 1443  BP: 133/64  Pulse: 85  Temp: 98.2 F (36.8 C)  Resp: 16    General: afebrile, feeling better, no acute complaints or distress; tolerating regular diet Cardiovascular: no rubs, no gallops, no murmurs  Respiratory: CTA bilaterally Abd: soft, NT, ND, positive BS Extremities: no edema, no cyanosis   Discharge Instructions You were cared for by a hospitalist during your hospital stay. If you have any questions about your discharge medications or the care you received while you were in the hospital after you are discharged, you can call the unit and asked to speak with the hospitalist on call if the hospitalist that took care of you is not available. Once you are discharged, your primary care physician will handle any further medical issues. Please note that NO REFILLS for any discharge medications will be authorized once you are discharged, as it is imperative that you return to your primary care physician (or establish a relationship with a primary care physician if you do not have one) for your aftercare needs so that they can reassess your need for medications and monitor your lab values.  Discharge Instructions   Discharge instructions    Complete by:  As directed   Take medications as prescribed Maintain yourself well hydrated Arrange follow up with PCP in 10 days          Discharge Medication List as of 03/01/2014  5:03 PM    START taking these medications   Details  levofloxacin (LEVAQUIN) 500 MG tablet Take 1 tablet (500 mg total) by mouth daily., Starting 03/01/2014, Until Discontinued, Print    metroNIDAZOLE (FLAGYL) 500 MG tablet Take 1 tablet (500 mg total) by mouth 3 (three)  times daily., Starting 03/01/2014, Until Discontinued, Print    saccharomyces boulardii (FLORASTOR) 250 MG capsule Take 1 capsule (250 mg total) by mouth 2 (two) times daily., Starting 03/01/2014, Until Discontinued, Print      CONTINUE these medications which have NOT  CHANGED   Details  aspirin 81 MG tablet Take 81 mg by mouth once., Historical Med    ranitidine (ZANTAC) 150 MG tablet Take 150-300 mg by mouth 2 (two) times daily. 317m in the morning and 1542min the evening., Until Discontinued, Historical Med       Allergies  Allergen Reactions  . Aspirin     REACTION: Swelling  . Cephalosporins     Stomach cramps  . Ciprofloxacin     Stomach cramps  . Fish Allergy     Shortness of breath  . Nitrofurantoin Monohyd Macro     Stomach cramps  . Penicillins     Per pt: unknown  . Protonix [Pantoprazole Sodium]     Respiratory distress  . Sulfa Antibiotics     Per pt: unknown   Follow-up Information   Follow up with ThScarlette CalicoMD. Schedule an appointment as soon as possible for a visit in 10 days.   Specialty:  Internal Medicine   Contact information:   [address] [phone]     The results of significant diagnostics from this hospitalization (including imaging, microbiology, ancillary and laboratory) are listed below for reference.    Significant Diagnostic Studies: Dg Chest 2 View  02/28/2014   CLINICAL DATA:  Upper abdominal pain and shortness of breath for 24 hr.  EXAM: CHEST  2 VIEW  COMPARISON:  04/1912  FINDINGS: The heart size and mediastinal contours are within normal limits. Both lungs are clear. The visualized skeletal structures are unremarkable.  IMPRESSION: No active cardiopulmonary disease.   Electronically Signed   By: WiLucienne Capers.D.   On: 02/28/2014 00:23   Ct Angio Chest Pe W/cm &/or Wo Cm  02/28/2014   CLINICAL DATA:  Tachycardia.  Right upper quadrant pain.  EXAM: CT ANGIOGRAPHY CHEST  CT ABDOMEN AND PELVIS WITH CONTRAST  TECHNIQUE: Multidetector CT imaging of the chest was performed using the standard protocol during bolus administration of intravenous contrast. Multiplanar CT image reconstructions and MIPs were obtained to evaluate the vascular anatomy.  Multidetector CT imaging of the abdomen and pelvis was performed using the standard protocol during bolus administration of intravenous contrast.  CONTRAST:  100 mL Omnipaque 350  COMPARISON:  CT abdomen and pelvis 12/15/2013  FINDINGS: CTA CHEST FINDINGS  Examination is technically limited due to poor contrast bolus. The main and lobar pulmonary arteries are moderately well visualized and are free of large central pulmonary emboli. Segmental and peripheral arteries are not able to be adequately evaluated for pulmonary embolus.  Normal heart size. Normal caliber thoracic aorta. Great vessel origins are patent. There is residual contrast material in the esophagus without dilatation. This may indicate reflux or dysmotility. No significant lymphadenopathy in the chest.  Evaluation of lungs is limited due to respiratory motion artifact. There appears to be dependent atelectasis in the lung bases. There is no evidence of focal consolidation. No pneumothorax. No pleural effusion.  CT ABDOMEN and PELVIS FINDINGS  The liver, spleen, gallbladder, pancreas, adrenal glands, kidneys, abdominal aorta, inferior vena cava, and retroperitoneal lymph nodes are unremarkable. Stomach, small bowel, and colon are not abnormally distended and no discrete wall thickening is appreciated. No free air  or free fluid in the abdomen. Abdominal wall musculature appears intact.  Pelvis: Uterus and ovaries are not enlarged. No free or loculated pelvic fluid collections. No pelvic mass or lymphadenopathy. Appendix is normal. No evidence of diverticulitis. No destructive bone lesions.  Review of the MIP images confirms the above findings.  IMPRESSION: Technically limited study is indeterminate for evaluation of possible pulmonary embolus. No evidence of active pulmonary disease.  No acute process demonstrated in the abdomen or pelvis.   Electronically Signed   By: Lucienne Capers M.D.   On: 02/28/2014 03:07   US Abdomen Complete  02/28/2014    CLINICAL DATA:  Right upper quadrant pain, fever, back pain, and diarrhea for 2 days. Elevated white cell count of 11.  EXAM: ULTRASOUND ABDOMEN COMPLETE  COMPARISON:  CT abdomen and pelvis 12/15/2013.  FINDINGS: Gallbladder: No gallstones or wall thickening visualized. No sonographic Murphy sign noted.  Common bile duct: Diameter: 6.3 mm, normal  Liver: No focal lesion identified. Within normal limits in parenchymal echogenicity.  IVC: No abnormality visualized.  Pancreas: Visualized portion unremarkable.  Spleen: Size and appearance within normal limits.  Right Kidney: Length: 11.4 cm. Echogenicity within normal limits. No mass or hydronephrosis visualized.  Left Kidney: Length: 10.9 cm. Echogenicity within normal limits. No mass or hydronephrosis visualized.  Abdominal aorta: No aneurysm visualized.  Other findings: None.  IMPRESSION: No acute abnormalities demonstrated.   Electronically Signed   By: Lucienne Capers M.D.   On: 02/28/2014 00:07   Ct Abdomen Pelvis W Contrast  02/28/2014   CLINICAL DATA:  Tachycardia.  Right upper quadrant pain.  EXAM: CT ANGIOGRAPHY CHEST  CT ABDOMEN AND PELVIS WITH CONTRAST  TECHNIQUE: Multidetector CT imaging of the chest was performed using the standard protocol during bolus administration of intravenous contrast. Multiplanar CT image reconstructions and MIPs were obtained to evaluate the vascular anatomy. Multidetector CT imaging of the abdomen and pelvis was performed using the standard protocol during bolus administration of intravenous contrast.  CONTRAST:  100 mL Omnipaque 350  COMPARISON:  CT abdomen and pelvis 12/15/2013  FINDINGS: CTA CHEST FINDINGS  Examination is technically limited due to poor contrast bolus. The main and lobar pulmonary arteries are moderately well visualized and are free of large central pulmonary emboli. Segmental and peripheral arteries are not able to be adequately evaluated for pulmonary embolus.  Normal heart size. Normal caliber thoracic  aorta. Great vessel origins are patent. There is residual contrast material in the esophagus without dilatation. This may indicate reflux or dysmotility. No significant lymphadenopathy in the chest.  Evaluation of lungs is limited due to respiratory motion artifact. There appears to be dependent atelectasis in the lung bases. There is no evidence of focal consolidation. No pneumothorax. No pleural effusion.  CT ABDOMEN and PELVIS FINDINGS  The liver, spleen, gallbladder, pancreas, adrenal glands, kidneys, abdominal aorta, inferior vena cava, and retroperitoneal lymph nodes are unremarkable. Stomach, small bowel, and colon are not abnormally distended and no discrete wall thickening is appreciated. No free air or free fluid in the abdomen. Abdominal wall musculature appears intact.  Pelvis: Uterus and ovaries are not enlarged. No free or loculated pelvic fluid collections. No pelvic mass or lymphadenopathy. Appendix is normal. No evidence of diverticulitis. No destructive bone lesions.  Review of the MIP images confirms the above findings.  IMPRESSION: Technically limited study is indeterminate for evaluation of possible pulmonary embolus. No evidence of active pulmonary disease.  No acute process demonstrated in the abdomen or pelvis.   Electronically  Signed   By: Lucienne Capers M.D.   On: 02/28/2014 03:07    Microbiology: Recent Results (from the past 240 hour(s))  URINE CULTURE     Status: None   Collection Time    02/27/14  9:02 PM      Result Value Ref Range Status   Specimen Description URINE, CLEAN CATCH   Final   Special Requests NONE   Final   Culture  Setup Time     Final   Value: 02/28/2014 09:17     Performed at [facility]     Final   Value: NO GROWTH     Performed at [facility]   Culture     Final   Value: NO GROWTH     Performed at [facility]   Report Status 03/01/2014 FINAL   Final  RESPIRATORY VIRUS PANEL     Status: Abnormal    Collection Time    02/28/14  4:25 AM      Result Value Ref Range Status   Source - RVPAN NASAL SWAB   Corrected   Comment: CORRECTED ON 10/21 AT 2122: PREVIOUSLY REPORTED AS NASAL SWAB   Respiratory Syncytial Virus A NOT DETECTED   Final   Respiratory Syncytial Virus B NOT DETECTED   Final   Influenza A NOT DETECTED   Final   Influenza B NOT DETECTED   Final   Parainfluenza 1 NOT DETECTED   Final   Parainfluenza 2 NOT DETECTED   Final   Parainfluenza 3 NOT DETECTED   Final   Metapneumovirus NOT DETECTED   Final   Rhinovirus DETECTED (*)  Final   Adenovirus NOT DETECTED   Final   Influenza A H1 NOT DETECTED   Final   Influenza A H3 NOT DETECTED   Final   Comment: (NOTE)           Normal Reference Range for each Analyte: NOT DETECTED     Testing performed using the Luminex xTAG Respiratory Viral Panel test     kit.     The analytical performance characteristics of this assay have been     determined by [facility].  The modifications have not been     cleared or approved by the FDA. This assay has been validated pursuant     to the CLIA regulations and is used for clinical purposes.     Performed at [facility], BLOOD (ROUTINE X 2)     Status: None   Collection Time    02/28/14  4:40 AM      Result Value Ref Range Status   Specimen Description BLOOD RIGHT HAND   Final   Special Requests BOTTLES DRAWN AEROBIC AND ANAEROBIC 10CC   Final   Culture  Setup Time     Final   Value: 02/28/2014 09:07     Performed at [facility]   Culture     Final   Value:        BLOOD CULTURE RECEIVED NO GROWTH TO DATE CULTURE WILL BE HELD FOR 5 DAYS BEFORE ISSUING A FINAL NEGATIVE REPORT     Performed at [facility]   Report Status PENDING   Incomplete  CULTURE, BLOOD (ROUTINE X 2)     Status: None   Collection Time    02/28/14  4:42 AM      Result Value Ref Range Status   Specimen Description BLOOD LEFT WRIST  Final   Special Requests BOTTLES DRAWN  AEROBIC ONLY 4ML   Final   Culture  Setup Time     Final   Value: 02/28/2014 09:07     Performed at [facility]   Culture     Final   Value:        BLOOD CULTURE RECEIVED NO GROWTH TO DATE CULTURE WILL BE HELD FOR 5 DAYS BEFORE ISSUING A FINAL NEGATIVE REPORT     Performed at [facility]   Report Status PENDING   Incomplete  STOOL CULTURE     Status: None   Collection Time    02/28/14  9:03 AM      Result Value Ref Range Status   Specimen Description STOOL   Final   Special Requests NONE   Final   Culture     Final   Value: Culture reincubated for better growth     Performed at [facility]   Report Status PENDING   Incomplete  CLOSTRIDIUM DIFFICILE BY PCR     Status: None   Collection Time    02/28/14  9:03 AM      Result Value Ref Range Status   C difficile by pcr NEGATIVE  NEGATIVE Final   Comment: Performed at [facility]  MRSA PCR SCREENING     Status: None   Collection Time    02/28/14 12:19 PM      Result Value Ref Range Status   MRSA by PCR NEGATIVE  NEGATIVE Final   Comment:            The GeneXpert MRSA Assay (FDA     approved for NASAL specimens     only), is one component of a     comprehensive MRSA colonization     surveillance program. It is not     intended to diagnose MRSA     infection nor to guide or     monitor treatment for     MRSA infections.     Performed at [facility]: Basic Metabolic Panel:  Recent Labs Lab 02/27/14 2024 02/28/14 0539 03/01/14 0510  NA 139 139 137  K 4.1 3.2* 3.3*  CL 101 107 106  CO2 '22 20 19  ' GLUCOSE 105* 120* 105*  BUN 8 6 4*  CREATININE 0.60 0.60 0.61  CALCIUM 9.4 7.7* 8.2*   Liver Function Tests:  Recent Labs Lab 02/27/14 2024 02/28/14 0539 03/01/14 0510  AST '21 16 18  ' ALT '20 14 12  ' ALKPHOS 118* 87 81  BILITOT 0.5 0.4 0.3  PROT 7.9 6.1 5.9*  ALBUMIN 3.9 3.0* 2.8*    Recent Labs Lab 02/27/14 2024  LIPASE 22   CBC:  Recent Labs Lab  02/27/14 2024 02/28/14 0539 03/01/14 0510  WBC 11.0* 10.6* 12.4*  NEUTROABS 9.3* 9.2* 9.8*  HGB 13.4 11.8* 10.9*  HCT 41.4 35.0* 33.7*  MCV 81.3 81.0 82.6  PLT 293 238 207   Cardiac Enzymes:  Recent Labs Lab 02/28/14 0539 02/28/14 1145 02/28/14 1717  TROPONINI <0.30 <0.30 <0.30    Signed:  Barton Dubois  [facility] 03/01/2014, 6:11 PM

## 2014-03-01 NOTE — ED Provider Notes (Signed)
Medical screening examination/treatment/procedure(s) were conducted as a shared visit with non-physician practitioner(s) and myself.  I personally evaluated the patient during the encounter.   EKG Interpretation   Date/Time:  Wednesday February 28 2014 01:56:52 EDT Ventricular Rate:  134 PR Interval:  160 QRS Duration: 78 QT Interval:  284 QTC Calculation: 424 R Axis:   41 Text Interpretation:  Sinus tachycardia Borderline T abnormalities,  anterior leads Confirmed by OTTER  MD, OLGA ([id number]) on 02/28/2014 1:59:34  AM       Patient with fever, diarrhea and RUQ pain. No obvious etiology. U/S negative. Remains persistently tachycardic, will need further imaging and possibly admission depending on her course.  Ephraim Hamburger, MD 03/01/14 [phone]

## 2014-03-02 LAB — OVA AND PARASITE EXAMINATION

## 2014-03-03 LAB — RESPIRATORY VIRUS PANEL
Adenovirus: NOT DETECTED
Influenza A H1: NOT DETECTED
Influenza A H3: NOT DETECTED
Influenza A: NOT DETECTED
Influenza B: NOT DETECTED
Metapneumovirus: NOT DETECTED
Parainfluenza 1: NOT DETECTED
Parainfluenza 2: NOT DETECTED
Parainfluenza 3: NOT DETECTED
Respiratory Syncytial Virus A: NOT DETECTED
Respiratory Syncytial Virus B: NOT DETECTED
Rhinovirus: NOT DETECTED

## 2014-03-05 ENCOUNTER — Telehealth: Payer: Self-pay | Admitting: Internal Medicine

## 2014-03-05 NOTE — Telephone Encounter (Signed)
Sensitivity report shows sensitive to cipro so the levaquin should have covered this

## 2014-03-05 NOTE — Telephone Encounter (Signed)
Janie called in from [place] lab . She said that she need to speak to nurse to give her some info regarding pt.    Call back number [phone] ext [phone]

## 2014-03-05 NOTE — Telephone Encounter (Signed)
Pathology report Pt was in hospital and they ordered a stool culture. Results Growing shigella Sonnei. Pt has hosp f/u tomorrow with Marya Amsler Calone./lmb

## 2014-03-05 NOTE — Telephone Encounter (Signed)
Called pt no answer LMOM (H) with md response.../lmb 

## 2014-03-06 ENCOUNTER — Telehealth: Payer: Self-pay | Admitting: Family

## 2014-03-06 ENCOUNTER — Other Ambulatory Visit (INDEPENDENT_AMBULATORY_CARE_PROVIDER_SITE_OTHER): Payer: 59

## 2014-03-06 ENCOUNTER — Encounter: Payer: Self-pay | Admitting: Family

## 2014-03-06 ENCOUNTER — Ambulatory Visit (INDEPENDENT_AMBULATORY_CARE_PROVIDER_SITE_OTHER): Payer: 59 | Admitting: Family

## 2014-03-06 VITALS — BP 130/68 | HR 81 | Temp 97.4°F | Resp 18 | Wt 213.4 lb

## 2014-03-06 DIAGNOSIS — R1013 Epigastric pain: Secondary | ICD-10-CM

## 2014-03-06 LAB — CULTURE, BLOOD (ROUTINE X 2)
Culture: NO GROWTH
Culture: NO GROWTH

## 2014-03-06 LAB — BASIC METABOLIC PANEL
BUN: 6 mg/dL (ref 6–23)
CO2: 18 mEq/L — ABNORMAL LOW (ref 19–32)
Calcium: 9 mg/dL (ref 8.4–10.5)
Chloride: 107 mEq/L (ref 96–112)
Creatinine, Ser: 0.7 mg/dL (ref 0.4–1.2)
GFR: 105.2 mL/min (ref >60.00)
Glucose, Bld: 96 mg/dL (ref 70–99)
Potassium: 3.6 mEq/L (ref 3.5–5.1)
Sodium: 138 mEq/L (ref 135–145)

## 2014-03-06 LAB — CBC
HCT: 36.5 % (ref 36.0–46.0)
Hemoglobin: 12.1 g/dL (ref 12.0–15.0)
MCHC: 33.2 g/dL (ref 30.0–36.0)
MCV: 81.1 fl (ref 78.0–100.0)
Platelets: 301 10*3/uL (ref 150.0–400.0)
RBC: 4.49 Mil/uL (ref 3.87–5.11)
RDW: 14.5 % (ref 11.5–15.5)
WBC: 9.5 10*3/uL (ref 4.0–10.5)

## 2014-03-06 NOTE — Telephone Encounter (Signed)
Please call the patient to inform her that her follow up labs are looking good and are within the normal expected range. She should start to feel better once she returns to eating more. If she has any problems or concerns, please let us know. Otherwise she is on the right track.

## 2014-03-06 NOTE — Progress Notes (Signed)
Subjective:    Patient ID: Betty Bell, female    DOB: 1961-04-21, 53 y.o.   MRN: [id number]  Chief Complaint  Patient presents with  . Hospitalization Follow-up    feels better with side pain but now feels SOB and Dizziness since she has left. Not sure if its the levaquin    HPI:  Betty Bell is a 53 y.o. female who presents today for for hospital follow up.   Was recently discharged from the hospital on 10/22. She was admitted with right lower quadrant abdominal pain. Pain occurred worse when she ate and had associated diarrhea. Pain was slightly improved after bowel movement. She had fever and chills. C-Diff negative. Discharged on the levofloxicin. Stool culture was positive for Shigella Sonei with a sensitivity to ciprofloxacin.  All other hospital notes reviewed.   Pain in the side and fever have dissipated since discharge. Continues to feel slightly weak and dizzy at times. Hasn't been able to eat a lot. Diarrhea still occuring, but the frequency has decreased. Has not had a bowel movement since Saturday. Stopped taking the flagyl secondary to stomach upset and currently finishing the Cipro. Diet is slowly progressing from clear liquids and to solid foods. Mild bloating still present. Denies any new fevers, chills, or abdominal pain  Allergies  Allergen Reactions  . Aspirin     REACTION: Swelling  . Cephalosporins     Stomach cramps  . Ciprofloxacin     Stomach cramps  . Fish Allergy     Shortness of breath  . Nitrofurantoin Monohyd Macro     Stomach cramps  . Penicillins     Per pt: unknown  . Protonix [Pantoprazole Sodium]     Respiratory distress  . Sulfa Antibiotics     Per pt: unknown   Current Outpatient Prescriptions on File Prior to Visit  Medication Sig Dispense Refill  . aspirin 81 MG tablet Take 81 mg by mouth once.      Marland Kitchen levofloxacin (LEVAQUIN) 500 MG tablet Take 1 tablet (500 mg total) by mouth daily.  8 tablet  0  . metroNIDAZOLE (FLAGYL) 500  MG tablet Take 1 tablet (500 mg total) by mouth 3 (three) times daily.  24 tablet  0  . ranitidine (ZANTAC) 150 MG tablet Take 150-300 mg by mouth 2 (two) times daily. 300mg  in the morning and 150mg  in the evening.      . saccharomyces boulardii (FLORASTOR) 250 MG capsule Take 1 capsule (250 mg total) by mouth 2 (two) times daily.  60 capsule  0   No current facility-administered medications on file prior to visit.   Past Medical History  Diagnosis Date  . GERD (gastroesophageal reflux disease)   . Uterine fibroid   . Hydrosalpinx     Left and Right  . Cervical dysplasia     High Grade  . Endometrial polyp   . CIN I (cervical intraepithelial neoplasia I)   . Seasonal allergies   . Anxiety   . Asthma   . IBS (irritable bowel syndrome)   . Colon polyp 2015    TUBULAR ADENOMA    Review of Systems    See HPI  Objective:    BP 130/68  Pulse 81  Temp(Src) 97.4 F (36.3 C) (Oral)  Resp 18  Wt 213 lb 6.4 oz (96.798 kg)  SpO2 98%  LMP 03/26/2011 Nursing note and vital signs reviewed.  Physical Exam  Constitutional: She is oriented to person, place, and time. She appears well-developed  and well-nourished.  Cardiovascular: Normal rate, regular rhythm and normal heart sounds.   Pulmonary/Chest: Effort normal and breath sounds normal.  Abdominal: Soft. Bowel sounds are normal. She exhibits no mass. There is tenderness (Mild tendnerness RUQ/RLQ). There is no rebound and no guarding.  Neurological: She is alert and oriented to person, place, and time.  Skin: Skin is warm and dry.  Psychiatric: She has a normal mood and affect. Her behavior is normal. Judgment and thought content normal.       Assessment & Plan:

## 2014-03-06 NOTE — Progress Notes (Signed)
Pre visit review using our clinic review tool, if applicable. No additional management support is needed unless otherwise documented below in the visit note. 

## 2014-03-06 NOTE — Assessment & Plan Note (Addendum)
Abdominal pain appears related to Shigella infection which was treated with Cipro. Diarrhea has subsided and no new fevers. Still remains slightly dizzy at times. Diet is improving, able to eat solid foods yesterday. Continue to advance diet as tolerated. Complete Cipro as prescribed. Obtain CBC and BMET. Follow up if symptoms worsen or fail to improve.

## 2014-03-06 NOTE — Patient Instructions (Addendum)
Thank you for choosing [facility].  Summary/Instructions:   Please stop by the lab for blood work - we will be in touch regarding the results when they become available.   Please complete your current dose of antibiotics.   Continue to use the Activia to help manage your IBS and symptoms.  Advance your diet as tolerated.  If your symptoms worsen or fail to improve, please let us know.    Shigella Infection, Adult Shigella infection occurs when certain bacteria infect the intestines. Symptoms usually start between 2 days and 4 days after ingestion of the bacteria, but they may begin as late as 1 week after ingestion. The illness usually lasts from 5 days to 7 days. Shigella infection can spread from person to person (contagious). Most people recover completely. In rare cases, lasting problems may develop, such as arthritis, kidney problems, or abnormal blood counts. CAUSES  The bacteria that cause shigella infection are found in the stool of infected people. You can become infected by:  Eating food or drinking liquids that are contaminated with the bacteria.  Touching surfaces or objects contaminated with the bacteria and then placing your hand in your mouth.  Having direct contact with a person who is infected. This may occur while caring for someone with illness or while sharing foods or eating utensils with someone who is ill.  Swimming in contaminated water. SYMPTOMS   Diarrhea, commonly with blood, mucus, or pus.  Abdominal pain or cramps.  Fever.  Nausea.  Vomiting.  Loss of appetite.  Rectal spasms (tenesmus).  Rectum protruding out of the body (rectal prolapse). DIAGNOSIS  Your caregiver will take your history and perform a physical exam. A stool sample may also be taken and tested for the presence of Shigella bacteria. TREATMENT  Often, no treatment is needed. However, you will need to drink plenty of fluids to prevent dehydration. Preventing and  treating dehydration is important because severe dehydration can cause serious problems. In severe cases, antibiotic medicines may be given to help shorten the illness and to prevent others from being infected. Antidiarrheal medicines are not recommended. They can make your condition worse. HOME CARE INSTRUCTIONS  Wash your hands well to avoid spreading the bacteria.  Do not prepare food if you have diarrhea.  If you are given antibiotics, take them as directed. Finish them even if you start to feel better.  Only take over-the-counter or prescription medicines for pain, discomfort, or fever as directed by your caregiver.  Drink enough fluids to keep your urine clear or pale yellow. Until your diarrhea, nausea, or vomiting is under control, you should only drink clear liquids. Clear liquids are anything you can see through, such as water, broth, or non-caffeinated tea. Avoid:  Milk.  Fruit juice.  Alcohol.  Extremely hot or cold fluids.  If you do not have an appetite, do not force yourself to eat. However, you must continue to drink fluids.  If you have an appetite, eat a normal diet unless your caregiver tells you differently.  Eat a variety of complex carbohydrates (rice, wheat, potatoes, bread), lean meats, yogurt, fruits, and vegetables.  Avoid high-fat foods because they are more difficult to digest.  If you are dehydrated, ask your caregiver for specific rehydration instructions. Signs of dehydration may include:  Severe thirst.  Dry lips and mouth.  Dizziness.  Dark urine.  Decreasing urine frequency and amount.  Confusion.  Rapid breathing or pulse.  Keep all follow-up appointments as directed by your caregiver.  SEEK IMMEDIATE MEDICAL CARE IF:   You are unable to keep fluids down.  You have persistent vomiting or diarrhea.  You have abdominal pain that increases or is concentrated in one small area (localized).  Your diarrhea contains increased blood or  mucus.  You feel very weak, dizzy, thirsty, or you faint.  You lose a significant amount of weight. Your caregiver can tell you how much weight loss should concern you.  You have a fever.  You feel confused. MAKE SURE YOU:   Understand these instructions.  Will watch your condition.  Will get help right away if you are not doing well or get worse. Document Released: 04/24/2000 Document Revised: 09/11/2013 Document Reviewed: 06/25/2011 [facility] Patient Information 2015 [place], [place]. This information is not intended to replace advice given to you by your health care provider. Make sure you discuss any questions you have with your health care provider.

## 2014-03-07 LAB — STOOL CULTURE

## 2014-03-07 NOTE — Telephone Encounter (Signed)
Called pt to let her know her lab results were normal. I let her know that she should be feeling better once she starts eating more. Told her if she had any questions or concerns to give [place] a call back. Pt understood.

## 2014-03-12 ENCOUNTER — Encounter: Payer: Self-pay | Admitting: Family

## 2014-04-12 ENCOUNTER — Telehealth: Payer: Self-pay | Admitting: Internal Medicine

## 2014-04-12 NOTE — Telephone Encounter (Signed)
Patient is calling because she is still having some problems after eating some foods. States she had a bacterial infection back in October and is still not back to normal. She reports she was constipated but now stools are soft. Today, she had some blood on the tissue when she wiped after a bowel movement. Last colonoscopy 08/18/13-adenomatous polyp. Please, advise.

## 2014-05-21 ENCOUNTER — Encounter: Payer: Self-pay | Admitting: *Deleted

## 2014-05-21 NOTE — Telephone Encounter (Signed)
Message had not been answered. Called patient and she states she never had anymore bleeding. She is having rectal itching and burning. Scheduled with Dr. Olevia Bell tomorrow at 11:00 AM.

## 2014-05-21 NOTE — Telephone Encounter (Signed)
error 

## 2014-05-21 NOTE — Telephone Encounter (Signed)
-----   Message from Darden Dates sent at 05/21/2014 11:28 AM EST ----- Dont think this message was ever answered. ?  Cleaning out my box today  Thanks Amy

## 2014-05-22 ENCOUNTER — Encounter: Payer: Self-pay | Admitting: Internal Medicine

## 2014-05-22 ENCOUNTER — Ambulatory Visit (INDEPENDENT_AMBULATORY_CARE_PROVIDER_SITE_OTHER): Payer: 59 | Admitting: Internal Medicine

## 2014-05-22 VITALS — BP 140/80 | HR 84 | Ht 64.0 in | Wt 208.0 lb

## 2014-05-22 DIAGNOSIS — R208 Other disturbances of skin sensation: Secondary | ICD-10-CM | POA: Insufficient documentation

## 2014-05-22 DIAGNOSIS — B372 Candidiasis of skin and nail: Secondary | ICD-10-CM

## 2014-05-22 DIAGNOSIS — L290 Pruritus ani: Secondary | ICD-10-CM

## 2014-05-22 MED ORDER — RANITIDINE HCL 300 MG PO TABS: 300.0000 mg | ORAL_TABLET | Freq: Two times a day (BID) | ORAL | Status: DC

## 2014-05-22 MED ORDER — FLUCONAZOLE 100 MG PO TABS: 100.0000 mg | ORAL_TABLET | Freq: Every day | ORAL | Status: DC

## 2014-05-22 NOTE — Progress Notes (Signed)
Jonni Oelkers [birth year] [id number]  Note: This dictation was prepared with Dragon digital system. Any transcriptional errors that result from this procedure are unintentional.   History of Present Illness: This is a 54 year old African-American female seen in the past for colorectal screening. Positive family history of colon cancer in her father, brother and colon polyps in her sister. Last colonoscopy in April 2015. She comes today with the complaints of rectal itching and burning which has now extended to both  groins. She was hospitalized in October 2015 with acute viral gastroenteritis. She still has epigastric discomfort and bloating for which she she takes over-the-counter ranitidine 150 mg twice a day.    Past Medical History  Diagnosis Date  . GERD (gastroesophageal reflux disease)   . Uterine fibroid   . Hydrosalpinx     Left and Right  . Cervical dysplasia     High Grade  . Endometrial polyp   . CIN I (cervical intraepithelial neoplasia I)   . Seasonal allergies   . Anxiety   . Asthma   . IBS (irritable bowel syndrome)   . Colon polyp 2015    TUBULAR ADENOMA  . Rectal burning     Past Surgical History  Procedure Laterality Date  . Uterine fibroid embolization    . Pelvic laparoscopy      DL  . Dilation and curettage of uterus    . Hysteroscopy    . Laser ablation of the cervix    . Carpal tunnel release Left     x 2  . Wrist cystectomy Right   . Colonoscopy  2015    Allergies  Allergen Reactions  . Aspirin     REACTION: Swelling  . Cephalosporins     Stomach cramps  . Ciprofloxacin     Stomach cramps  . Fish Allergy     Shortness of breath  . Nitrofurantoin Monohyd Macro     Stomach cramps  . Penicillins     Per pt: unknown  . Protonix [Pantoprazole Sodium]     Respiratory distress  . Sulfa Antibiotics     Per pt: unknown    Family history and social history have been reviewed.  Review of Systems: Dyspepsia, epigastric pain and bloating.  Rectal pruritus  The remainder of the 10 point ROS is negative except as outlined in the H&P  Physical Exam: General Appearance Well developed, in no distress, overweight Eyes  Non icteric  HEENT  Non traumatic, normocephalic  Mouth No lesion, tongue papillated, no cheilosis Neck Supple without adenopathy, thyroid not enlarged, no carotid bruits, no JVD Lungs Clear to auscultation bilaterally COR Normal S1, normal S2, regular rhythm, no murmur, quiet precordium Abdomen soft nontender with normoactive bowel sounds. Minimal tenderness in epigastrium. Liver edge at costal margin. Edema in both groins along with the skin crease. No exudate or bleeding consistent with Candida Rectal normal perianal area with small skin tags no dermatitis or fissures Extremities  No pedal edema Skin No lesions Neurological Alert and oriented x 3 Psychological Normal mood and affect  Assessment and Plan:   54 year old African-American female who has a pruritus in her groin and around the rectum suggestive of  Candida dermatitis. We will prescribe Diflucan 100 mg daily for 5 days. Because of the skin irritation we will add 1% hydrocortisone cream to use 2-3 times a day as needed  Epigastric discomfort. Status post a recent viral gastroenteritis. We will change to ranitidine 300 mg twice a day. She did not find a  benefit from Protonix or Prilosec and prefers to take H2 receptor antagonist  carb modified low-fat diet to help with the abdominal bloating and gastritis.     Delfin Edis 05/22/2014

## 2014-05-22 NOTE — Patient Instructions (Addendum)
Prescriptions have been sent to your pharmacy Purchase OTC 1%  hydrocortisone cream and apply to the affected areas as needed  We will contact you with your appointment to a dietician   Fat and Cholesterol Control Diet Fat and cholesterol levels in your blood and organs are influenced by your diet. High levels of fat and cholesterol may lead to diseases of the heart, small and large blood vessels, gallbladder, liver, and pancreas. CONTROLLING FAT AND CHOLESTEROL WITH DIET Although exercise and lifestyle factors are important, your diet is key. That is because certain foods are known to raise cholesterol and others to lower it. The goal is to balance foods for their effect on cholesterol and more importantly, to replace saturated and trans fat with other types of fat, such as monounsaturated fat, polyunsaturated fat, and omega-3 fatty acids. On average, a person should consume no more than 15 to 17 g of saturated fat daily. Saturated and trans fats are considered "bad" fats, and they will raise LDL cholesterol. Saturated fats are primarily found in animal products such as meats, butter, and cream. However, that does not mean you need to give up all your favorite foods. Today, there are good tasting, low-fat, low-cholesterol substitutes for most of the things you like to eat. Choose low-fat or nonfat alternatives. Choose round or loin cuts of red meat. These types of cuts are lowest in fat and cholesterol. Chicken (without the skin), fish, veal, and ground [place] breast are great choices. Eliminate fatty meats, such as hot dogs and salami. Even shellfish have little or no saturated fat. Have a 3 oz (85 g) portion when you eat lean meat, poultry, or fish. Trans fats are also called "partially hydrogenated oils." They are oils that have been scientifically manipulated so that they are solid at room temperature resulting in a longer shelf life and improved taste and texture of foods in which they are added.  Trans fats are found in stick margarine, some tub margarines, cookies, crackers, and baked goods.  When baking and cooking, oils are a great substitute for butter. The monounsaturated oils are especially beneficial since it is believed they lower LDL and raise HDL. The oils you should avoid entirely are saturated tropical oils, such as coconut and palm.  Remember to eat a lot from food groups that are naturally free of saturated and trans fat, including fish, fruit, vegetables, beans, grains (barley, rice, couscous, bulgur wheat), and pasta (without cream sauces).  IDENTIFYING FOODS THAT LOWER FAT AND CHOLESTEROL  Soluble fiber may lower your cholesterol. This type of fiber is found in fruits such as apples, vegetables such as broccoli, potatoes, and carrots, legumes such as beans, peas, and lentils, and grains such as barley. Foods fortified with plant sterols (phytosterol) may also lower cholesterol. You should eat at least 2 g per day of these foods for a cholesterol lowering effect.  Read package labels to identify low-saturated fats, trans fat free, and low-fat foods at the supermarket. Select cheeses that have only 2 to 3 g saturated fat per ounce. Use a heart-healthy tub margarine that is free of trans fats or partially hydrogenated oil. When buying baked goods (cookies, crackers), avoid partially hydrogenated oils. Breads and muffins should be made from whole grains (whole-wheat or whole oat flour, instead of "flour" or "enriched flour"). Buy non-creamy canned soups with reduced salt and no added fats.  FOOD PREPARATION TECHNIQUES  Never deep-fry. If you must fry, either stir-fry, which uses very little fat, or use non-stick  cooking sprays. When possible, broil, bake, or roast meats, and steam vegetables. Instead of putting butter or margarine on vegetables, use lemon and herbs, applesauce, and cinnamon (for squash and sweet potatoes). Use nonfat yogurt, salsa, and low-fat dressings for salads.   LOW-SATURATED FAT / LOW-FAT FOOD SUBSTITUTES Meats / Saturated Fat (g)  Avoid: Steak, marbled (3 oz/85 g) / 11 g  Choose: Steak, lean (3 oz/85 g) / 4 g  Avoid: Hamburger (3 oz/85 g) / 7 g  Choose: Hamburger, lean (3 oz/85 g) / 5 g  Avoid: Ham (3 oz/85 g) / 6 g  Choose: Ham, lean cut (3 oz/85 g) / 2.4 g  Avoid: Chicken, with skin, dark meat (3 oz/85 g) / 4 g  Choose: Chicken, skin removed, dark meat (3 oz/85 g) / 2 g  Avoid: Chicken, with skin, light meat (3 oz/85 g) / 2.5 g  Choose: Chicken, skin removed, light meat (3 oz/85 g) / 1 g Dairy / Saturated Fat (g)  Avoid: Whole milk (1 cup) / 5 g  Choose: Low-fat milk, 2% (1 cup) / 3 g  Choose: Low-fat milk, 1% (1 cup) / 1.5 g  Choose: Skim milk (1 cup) / 0.3 g  Avoid: Hard cheese (1 oz/28 g) / 6 g  Choose: Skim milk cheese (1 oz/28 g) / 2 to 3 g  Avoid: Cottage cheese, 4% fat (1 cup) / 6.5 g  Choose: Low-fat cottage cheese, 1% fat (1 cup) / 1.5 g  Avoid: Ice cream (1 cup) / 9 g  Choose: Sherbet (1 cup) / 2.5 g  Choose: Nonfat frozen yogurt (1 cup) / 0.3 g  Choose: Frozen fruit bar / trace  Avoid: Whipped cream (1 tbs) / 3.5 g  Choose: Nondairy whipped topping (1 tbs) / 1 g Condiments / Saturated Fat (g)  Avoid: Mayonnaise (1 tbs) / 2 g  Choose: Low-fat mayonnaise (1 tbs) / 1 g  Avoid: Butter (1 tbs) / 7 g  Choose: Extra light margarine (1 tbs) / 1 g  Avoid: Coconut oil (1 tbs) / 11.8 g  Choose: Olive oil (1 tbs) / 1.8 g  Choose: Corn oil (1 tbs) / 1.7 g  Choose: Safflower oil (1 tbs) / 1.2 g  Choose: Sunflower oil (1 tbs) / 1.4 g  Choose: Soybean oil (1 tbs) / 2.4 g  Choose: Canola oil (1 tbs) / 1 g Document Released: 04/27/2005 Document Revised: 08/22/2012 Document Reviewed: 07/26/2013 [employer] Patient Information 2015 [place], [place]. This information is not intended to replace advice given to you by your health care provider. Make sure you discuss any questions you have with your health care  provider. Dr Scarlette Calico

## 2014-05-23 ENCOUNTER — Other Ambulatory Visit: Payer: Self-pay | Admitting: *Deleted

## 2014-05-23 ENCOUNTER — Telehealth: Payer: Self-pay | Admitting: Internal Medicine

## 2014-05-23 DIAGNOSIS — Z6835 Body mass index (BMI) 35.0-35.9, adult: Secondary | ICD-10-CM

## 2014-05-23 DIAGNOSIS — K529 Noninfective gastroenteritis and colitis, unspecified: Secondary | ICD-10-CM

## 2014-05-23 NOTE — Telephone Encounter (Signed)
Please send Ketoconazole 200mg , # 14 , 1 po qd, 1 refill

## 2014-05-23 NOTE — Telephone Encounter (Signed)
Patient states the Diflucan made her stomach hurt. She is asking if she can get a prescription for something else for her yeast infection. Please, advise.

## 2014-05-24 MED ORDER — KETOCONAZOLE 200 MG PO TABS: 200.0000 mg | ORAL_TABLET | Freq: Every day | ORAL | Status: DC

## 2014-05-24 NOTE — Telephone Encounter (Signed)
Patient notified of recommendation. Rx sent.

## 2014-05-30 ENCOUNTER — Ambulatory Visit (INDEPENDENT_AMBULATORY_CARE_PROVIDER_SITE_OTHER): Payer: 59 | Admitting: Gynecology

## 2014-05-30 ENCOUNTER — Encounter: Payer: Self-pay | Admitting: Gynecology

## 2014-05-30 VITALS — BP 146/84

## 2014-05-30 DIAGNOSIS — L304 Erythema intertrigo: Secondary | ICD-10-CM

## 2014-05-30 MED ORDER — TERCONAZOLE 0.4 % VA CREA: 1.0000 | TOPICAL_CREAM | Freq: Every day | VAGINAL | Status: DC

## 2014-05-30 NOTE — Progress Notes (Signed)
   Patient presented to the office today because of persistence of vulvar pruritus. She had seen her gastroenterologist approximate 6 days ago and had been prescribed Diflucan 100 mg daily for 5 days along with topical application 1% hydrocortisone cream for 2-3 days as needed. Patient cannot tolerate the Diflucan and only took one tablet. She brought over-the-counter Monistat for 3 days her symptoms improved but still present. She is in a monogamous relationship. She is on no hormone replacement therapy. She reports no vaginal bleeding.  Exam: Bartholin urethra Skene glands and area of the inguinal crease slightly erythematous from pruritus was noted. No excoriated areas were noted no ulceration was noted no pigmentation was noted. The speculum was introduced into the vagina and no lesions were seen.  Assessment/plan: Patient with clinical evidence of intertrigo. Patient is going to be placed on Terazol 7 which she is going to apply topically and some inside the vagina daily at bedtime for one week. The second week she will apply it 3 times a week. The third week she will apply twice a week. If this does not clear up completely she will then shave and repeat this sequence again. She is otherwise scheduled to see me for annual exam in the next few months and will follow-up as well or sooner if needed.

## 2014-05-30 NOTE — Patient Instructions (Signed)
Terconazole vaginal cream What is this medicine? TERCONAZOLE (ter KON a zole) is an antifungal medicine. It is used to treat yeast infections of the vagina. This medicine may be used for other purposes; ask your health care provider or pharmacist if you have questions. COMMON BRAND NAME(S): Terazol 3, Terazol 7, Zazole What should I tell my health care provider before I take this medicine? They need to know if you have any of these conditions: -an unusual or allergic reaction to terconazole, other antifungals, other medicines, foods, dyes or preservatives -pregnant or trying to get pregnant -breast-feeding How should I use this medicine? This medicine is only for use in the vagina. Do not take by mouth. Wash hands before and after use. Read package directions carefully before using. Use this medicine at bedtime, unless otherwise directed by your doctor or health care professional. Screw the applicator onto the end of the tube and squeeze the tube to fill the applicator. Remove the applicator from the tube. Lie on your back. Gently insert the applicator tip high in the vagina and push the plunger to release the cream into the vagina. Gently remove the applicator. Wash the applicator well with warm water and soap. Use at regular intervals. Do not get this medicine in your eyes. If you do, rinse out with plenty of cool tap water. Finish the full course prescribed by your doctor or health care professional even if you think your condition is better. Do not stop using this medicine if your menstrual period starts during the time of treatment. Talk to your pediatrician regarding the use of this medicine in children. Special care may be needed. Overdosage: If you think you have taken too much of this medicine contact a poison control center or emergency room at once. NOTE: This medicine is only for you. Do not share this medicine with others. What if I miss a dose? If you miss a dose, use it as soon as you  can. If it is almost time for your next dose, use only that dose. Do not use double or extra doses. What may interact with this medicine? Interactions are not expected. Do not use any other vaginal products without telling your doctor or health care professional. This list may not describe all possible interactions. Give your health care provider a list of all the medicines, herbs, non-prescription drugs, or dietary supplements you use. Also tell them if you smoke, drink alcohol, or use illegal drugs. Some items may interact with your medicine. What should I watch for while using this medicine? Tell your doctor or health care professional if your symptoms do not start to get better within a few days. It is better not to have sex until you have finished your treatment. If you have sex, your partner should use a condom during sex to help prevent transfer of the infection. Your sexual partner may also need treatment. Vaginal medicines usually will come out of the vagina during treatment. To keep the medicine from getting on your clothing, wear a mini-pad or sanitary napkin. The use of tampons is not recommended since they may soak up the medicine. To help clear up the infection, wear freshly washed cotton, not synthetic, underwear. What side effects may I notice from receiving this medicine? Side effects that you should report to your doctor or health care professional as soon as possible: -painful or difficult urination -vaginal pain Side effects that usually do not require medical attention (report to your doctor or health care professional if  they continue or are bothersome): -headache -menstrual pain -stomach upset -vaginal irritation, itching or burning This list may not describe all possible side effects. Call your doctor for medical advice about side effects. You may report side effects to FDA at 1-800-FDA-1088. Where should I keep my medicine? Keep out of the reach of children. Store at room  temperature between 15 and 30 degrees C (59 and 86 degrees F). Throw away any unused medicine after the expiration date. NOTE: This sheet is a summary. It may not cover all possible information. If you have questions about this medicine, talk to your doctor, pharmacist, or health care provider.  2015, [employer]. (2008-01-11 13:51:27) Intertrigo Intertrigo is a skin condition that occurs in between folds of skin in places on the body that rub together a lot and do not get much ventilation. It is caused by heat, moisture, friction, sweat retention, and lack of air circulation, which produces red, irritated patches and, sometimes, scaling or drainage. People who have diabetes, who are obese, or who have treatment with antibiotics are at increased risk for intertrigo. The most common sites for intertrigo to occur include:  The groin.  The breasts.  The armpits.  Folds of abdominal skin.  Webbed spaces between the fingers or toes. Intertrigo may be aggravated by:  Sweat.  Feces.  Yeast or bacteria that are present near skin folds.  Urine.  Vaginal discharge. HOME CARE INSTRUCTIONS  The following steps can be taken to reduce friction and keep the affected area cool and dry:  Expose skin folds to the air.  Keep deep skin folds separated with cotton or linen cloth. Avoid tight fitting clothing that could cause chafing.  Wear open-toed shoes or sandals to help reduce moisture between the toes.  Apply absorbent powders to affected areas as directed by your caregiver.  Apply over-the-counter barrier pastes, such as zinc oxide, as directed by your caregiver.  If you develop a fungal infection in the affected area, your caregiver may have you use antifungal creams. SEEK MEDICAL CARE IF:   The rash is not improving after 1 week of treatment.  The rash is getting worse (more red, more swollen, more painful, or spreading).  You have a fever or chills. MAKE SURE YOU:    Understand these instructions.  Will watch your condition.  Will get help right away if you are not doing well or get worse. Document Released: 04/27/2005 Document Revised: 07/20/2011 Document Reviewed: 10/10/2009 [facility] Patient Information 2015 [place], [place]. This information is not intended to replace advice given to you by your health care provider. Make sure you discuss any questions you have with your health care provider.

## 2014-06-19 ENCOUNTER — Encounter: Payer: Self-pay | Admitting: Gynecology

## 2014-06-19 ENCOUNTER — Other Ambulatory Visit (HOSPITAL_COMMUNITY)
Admission: RE | Admit: 2014-06-19 | Discharge: 2014-06-19 | Disposition: A | Discharge status: Home / Self Care | Payer: 59 | Source: Ambulatory Visit | Attending: Gynecology | Admitting: Gynecology

## 2014-06-19 ENCOUNTER — Ambulatory Visit (INDEPENDENT_AMBULATORY_CARE_PROVIDER_SITE_OTHER): Payer: 59 | Admitting: Gynecology

## 2014-06-19 VITALS — BP 146/90 | Ht 64.0 in | Wt 208.0 lb

## 2014-06-19 DIAGNOSIS — Z01419 Encounter for gynecological examination (general) (routine) without abnormal findings: Secondary | ICD-10-CM | POA: Insufficient documentation

## 2014-06-19 DIAGNOSIS — Z1151 Encounter for screening for human papillomavirus (HPV): Secondary | ICD-10-CM | POA: Diagnosis present

## 2014-06-19 DIAGNOSIS — I10 Essential (primary) hypertension: Secondary | ICD-10-CM

## 2014-06-19 DIAGNOSIS — N7011 Chronic salpingitis: Secondary | ICD-10-CM

## 2014-06-19 DIAGNOSIS — D251 Intramural leiomyoma of uterus: Secondary | ICD-10-CM | POA: Insufficient documentation

## 2014-06-19 MED ORDER — HYDROCHLOROTHIAZIDE 12.5 MG PO CAPS: 12.5000 mg | ORAL_CAPSULE | Freq: Every day | ORAL | Status: DC

## 2014-06-19 NOTE — Patient Instructions (Signed)
Hydrochlorothiazide, HCTZ capsules or tablets What is this medicine? HYDROCHLOROTHIAZIDE (hye droe klor oh THYE a zide) is a diuretic. It increases the amount of urine passed, which causes the body to lose salt and water. This medicine is used to treat high blood pressure. It is also reduces the swelling and water retention caused by various medical conditions, such as heart, liver, or kidney disease. This medicine may be used for other purposes; ask your health care provider or pharmacist if you have questions. COMMON BRAND NAME(S): Esidrix, Ezide, HydroDIURIL, Microzide, Oretic, Zide What should I tell my health care provider before I take this medicine? They need to know if you have any of these conditions: -diabetes -gout -immune system problems, like lupus -kidney disease or kidney stones -liver disease -pancreatitis -small amount of urine or difficulty passing urine -an unusual or allergic reaction to hydrochlorothiazide, sulfa drugs, other medicines, foods, dyes, or preservatives -pregnant or trying to get pregnant -breast-feeding How should I use this medicine? Take this medicine by mouth with a glass of water. Follow the directions on the prescription label. Take your medicine at regular intervals. Remember that you will need to pass urine frequently after taking this medicine. Do not take your doses at a time of day that will cause you problems. Do not stop taking your medicine unless your doctor tells you to. Talk to your pediatrician regarding the use of this medicine in children. Special care may be needed. Overdosage: If you think you have taken too much of this medicine contact a poison control center or emergency room at once. NOTE: This medicine is only for you. Do not share this medicine with others. What if I miss a dose? If you miss a dose, take it as soon as you can. If it is almost time for your next dose, take only that dose. Do not take double or extra doses. What may  interact with this medicine? -cholestyramine -colestipol -digoxin -dofetilide -lithium -medicines for blood pressure -medicines for diabetes -medicines that relax muscles for surgery -other diuretics -steroid medicines like prednisone or cortisone This list may not describe all possible interactions. Give your health care provider a list of all the medicines, herbs, non-prescription drugs, or dietary supplements you use. Also tell them if you smoke, drink alcohol, or use illegal drugs. Some items may interact with your medicine. What should I watch for while using this medicine? Visit your doctor or health care professional for regular checks on your progress. Check your blood pressure as directed. Ask your doctor or health care professional what your blood pressure should be and when you should contact him or her. You may need to be on a special diet while taking this medicine. Ask your doctor. Check with your doctor or health care professional if you get an attack of severe diarrhea, nausea and vomiting, or if you sweat a lot. The loss of too much body fluid can make it dangerous for you to take this medicine. You may get drowsy or dizzy. Do not drive, use machinery, or do anything that needs mental alertness until you know how this medicine affects you. Do not stand or sit up quickly, especially if you are an older patient. This reduces the risk of dizzy or fainting spells. Alcohol may interfere with the effect of this medicine. Avoid alcoholic drinks. This medicine may affect your blood sugar level. If you have diabetes, check with your doctor or health care professional before changing the dose of your diabetic medicine. This medicine  can make you more sensitive to the sun. Keep out of the sun. If you cannot avoid being in the sun, wear protective clothing and use sunscreen. Do not use sun lamps or tanning beds/booths. What side effects may I notice from receiving this medicine? Side effects  that you should report to your doctor or health care professional as soon as possible: -allergic reactions such as skin rash or itching, hives, swelling of the lips, mouth, tongue, or throat -changes in vision -chest pain -eye pain -fast or irregular heartbeat -feeling faint or lightheaded, falls -gout attack -muscle pain or cramps -pain or difficulty when passing urine -pain, tingling, numbness in the hands or feet -redness, blistering, peeling or loosening of the skin, including inside the mouth -unusually weak or tired Side effects that usually do not require medical attention (report to your doctor or health care professional if they continue or are bothersome): -change in sex drive or performance -dry mouth -headache -stomach upset This list may not describe all possible side effects. Call your doctor for medical advice about side effects. You may report side effects to FDA at 1-800-FDA-1088. Where should I keep my medicine? Keep out of the reach of children. Store at room temperature between 15 and 30 degrees C (59 and 86 degrees F). Do not freeze. Protect from light and moisture. Keep container closed tightly. Throw away any unused medicine after the expiration date. NOTE: This sheet is a summary. It may not cover all possible information. If you have questions about this medicine, talk to your doctor, pharmacist, or health care provider.  2015, [employer]vier/Gold Standard. (2009-12-20 12:57:37) Hypertension Hypertension, commonly called high blood pressure, is when the force of blood pumping through your arteries is too strong. Your arteries are the blood vessels that carry blood from your heart throughout your body. A blood pressure reading consists of a higher number over a lower number, such as 110/72. The higher number (systolic) is the pressure inside your arteries when your heart pumps. The lower number (diastolic) is the pressure inside your arteries when your heart relaxes.  Ideally you want your blood pressure below 120/80. Hypertension forces your heart to work harder to pump blood. Your arteries may become narrow or stiff. Having hypertension puts you at risk for heart disease, stroke, and other problems.  RISK FACTORS Some risk factors for high blood pressure are controllable. Others are not.  Risk factors you cannot control include:   Race. You may be at higher risk if you are African American.  Age. Risk increases with age.  Gender. Men are at higher risk than women before age 35 years. After age 68, women are at higher risk than men. Risk factors you can control include:  Not getting enough exercise or physical activity.  Being overweight.  Getting too much fat, sugar, calories, or salt in your diet.  Drinking too much alcohol. SIGNS AND SYMPTOMS Hypertension does not usually cause signs or symptoms. Extremely high blood pressure (hypertensive crisis) may cause headache, anxiety, shortness of breath, and nosebleed. DIAGNOSIS  To check if you have hypertension, your health care provider will measure your blood pressure while you are seated, with your arm held at the level of your heart. It should be measured at least twice using the same arm. Certain conditions can cause a difference in blood pressure between your right and left arms. A blood pressure reading that is higher than normal on one occasion does not mean that you need treatment. If one blood pressure  reading is high, ask your health care provider about having it checked again. TREATMENT  Treating high blood pressure includes making lifestyle changes and possibly taking medicine. Living a healthy lifestyle can help lower high blood pressure. You may need to change some of your habits. Lifestyle changes may include:  Following the DASH diet. This diet is high in fruits, vegetables, and whole grains. It is low in salt, red meat, and added sugars.  Getting at least 2 hours of brisk physical  activity every week.  Losing weight if necessary.  Not smoking.  Limiting alcoholic beverages.  Learning ways to reduce stress. If lifestyle changes are not enough to get your blood pressure under control, your health care provider may prescribe medicine. You may need to take more than one. Work closely with your health care provider to understand the risks and benefits. HOME CARE INSTRUCTIONS  Have your blood pressure rechecked as directed by your health care provider.   Take medicines only as directed by your health care provider. Follow the directions carefully. Blood pressure medicines must be taken as prescribed. The medicine does not work as well when you skip doses. Skipping doses also puts you at risk for problems.   Do not smoke.   Monitor your blood pressure at home as directed by your health care provider. SEEK MEDICAL CARE IF:   You think you are having a reaction to medicines taken.  You have recurrent headaches or feel dizzy.  You have swelling in your ankles.  You have trouble with your vision. SEEK IMMEDIATE MEDICAL CARE IF:  You develop a severe headache or confusion.  You have unusual weakness, numbness, or feel faint.  You have severe chest or abdominal pain.  You vomit repeatedly.  You have trouble breathing. MAKE SURE YOU:   Understand these instructions.  Will watch your condition.  Will get help right away if you are not doing well or get worse. Document Released: 04/27/2005 Document Revised: 09/11/2013 Document Reviewed: 02/17/2013 [facility] Patient Information 2015 [place], [place]. This information is not intended to replace advice given to you by your health care provider. Make sure you discuss any questions you have with your health care provider.

## 2014-06-19 NOTE — Progress Notes (Signed)
Betty Bell 1960-09-26 [id number]   History:    54 y.o.  for annual gyn exam with no complaints today but was found to have an elevated blood pressure 146/90 and after a few minutes in the office it was rechecked and was found to be elevated at 164/94. Patient denied any headache or any other symptoms. Patient's mother and brother and sister with history of hypertension. Her primary care physician is Dr. Ronnald Ramp. Patient with known history of fibroid uterus as well as a right hydrosalpinx. Patient is menopausal on no hormone replacement therapy very minimal vasomotor symptoms. Patient reports having had a colonoscopy in 2015 benign polyps were removed. Her father and brother have history of colon cancer.  Patient's past GYN history as follows: In 2005 patient had colposcopy directed biopsy which demonstrated CIN-1 and CIN-2 and underwent a CO2 laser ablation of cervix. Followup Pap smear December 2011 low-grade squamous intraepithelial lesion no high-risk HPV detected. She underwent colposcopic biopsy in January 2012 demonstrated once again CIN-1. Pap smears within normal.  2009 as a result of the patient's history of dysfunctional uterine bleeding and endometrial polyps she underwent hysteroscopy D&C pathology report confirmed benign endometrial polyps with no evidence of hyperplasia or malignancy.  1995 patient had a D&C for first trimester missed abortion  Past medical history,surgical history, family history and social history were all reviewed and documented in the EPIC chart.  Gynecologic History Patient's last menstrual period was 03/26/2011. Contraception: post menopausal status Last Pap: 2014. Results were: normal Last mammogram: 2012. Results were: normal  Obstetric History OB History  Gravida Para Term Preterm AB SAB TAB Ectopic Multiple Living  1    1         # Outcome Date GA Lbr Len/2nd Weight Sex Delivery Anes PTL Lv  1 AB                ROS: A ROS was performed  and pertinent positives and negatives are included in the history.  GENERAL: No fevers or chills. HEENT: No change in vision, no earache, sore throat or sinus congestion. NECK: No pain or stiffness. CARDIOVASCULAR: No chest pain or pressure. No palpitations. PULMONARY: No shortness of breath, cough or wheeze. GASTROINTESTINAL: No abdominal pain, nausea, vomiting or diarrhea, melena or bright red blood per rectum. GENITOURINARY: No urinary frequency, urgency, hesitancy or dysuria. MUSCULOSKELETAL: No joint or muscle pain, no back pain, no recent trauma. DERMATOLOGIC: No rash, no itching, no lesions. ENDOCRINE: No polyuria, polydipsia, no heat or cold intolerance. No recent change in weight. HEMATOLOGICAL: No anemia or easy bruising or bleeding. NEUROLOGIC: No headache, seizures, numbness, tingling or weakness. PSYCHIATRIC: No depression, no loss of interest in normal activity or change in sleep pattern.     Exam: chaperone present  BP 146/90 mmHg  Ht 5\' 4"  (1.626 m)  Wt 208 lb (94.348 kg)  BMI 35.69 kg/m2  LMP 03/26/2011  Body mass index is 35.69 kg/(m^2).  General appearance : Well developed well nourished female. No acute distress HEENT: Neck supple, trachea midline, no carotid bruits, no thyroidmegaly Lungs: Clear to auscultation, no rhonchi or wheezes, or rib retractions  Heart: Regular rate and rhythm, no murmurs or gallops Breast:Examined in sitting and supine position were symmetrical in appearance, no palpable masses or tenderness,  no skin retraction, no nipple inversion, no nipple discharge, no skin discoloration, no axillary or supraclavicular lymphadenopathy Abdomen: no palpable masses or tenderness, no rebound or guarding Extremities: no edema or skin discoloration or tenderness  Pelvic:  Bartholin, Urethra, Skene Glands: Within normal limits             Vagina: No gross lesions or discharge  Cervix: No gross lesions or discharge  Uterus  anteverted slightly irregular, upper  limits of normal  Adnexa  Without masses or tenderness  Anus and perineum  normal   Rectovaginal  normal sphincter tone without palpated masses or tenderness             Hemoccult colonoscopy less than 12 months ago     Assessment/Plan:  54 y.o. female for annual exam with clinical evidence of hypertension. Patient with strong family history of hypertension whereby her mother and brother and sister have hypertension. She is going to be started on HCTZ 12.5 mg take 1 by mouth daily. She will contact her primary care physician Dr. Ronnald Ramp for follow-up this week. Her Pap smear was done today. Literature information on hypertension as well as on HCTZ was provided. No blood work done today. She was given a requisition to schedule her overdue mammogram. She'll return back to the office in 1-2 weeks for follow-up on her fibroid uterus and on her hydrosalpinx.   Terrance Mass MD, 4:49 PM 06/19/2014

## 2014-06-22 LAB — CYTOLOGY - PAP

## 2014-06-26 ENCOUNTER — Other Ambulatory Visit: Payer: Self-pay | Admitting: Gynecology

## 2014-06-26 DIAGNOSIS — Z1231 Encounter for screening mammogram for malignant neoplasm of breast: Secondary | ICD-10-CM

## 2014-07-03 ENCOUNTER — Ambulatory Visit (HOSPITAL_COMMUNITY)
Admission: RE | Admit: 2014-07-03 | Discharge: 2014-07-03 | Disposition: A | Discharge status: Home / Self Care | Payer: 59 | Source: Ambulatory Visit | Attending: Gynecology | Admitting: Gynecology

## 2014-07-03 DIAGNOSIS — Z1231 Encounter for screening mammogram for malignant neoplasm of breast: Secondary | ICD-10-CM

## 2014-07-06 ENCOUNTER — Ambulatory Visit: Payer: 59

## 2014-07-11 ENCOUNTER — Ambulatory Visit: Payer: 59 | Admitting: Gynecology

## 2014-07-11 ENCOUNTER — Other Ambulatory Visit: Payer: 59

## 2014-07-13 ENCOUNTER — Other Ambulatory Visit (INDEPENDENT_AMBULATORY_CARE_PROVIDER_SITE_OTHER): Payer: 59

## 2014-07-13 ENCOUNTER — Encounter: Payer: Self-pay | Admitting: Internal Medicine

## 2014-07-13 ENCOUNTER — Ambulatory Visit (INDEPENDENT_AMBULATORY_CARE_PROVIDER_SITE_OTHER): Payer: 59 | Admitting: Internal Medicine

## 2014-07-13 VITALS — BP 158/100 | HR 91 | Temp 98.9°F | Resp 16 | Ht 64.0 in | Wt 212.8 lb

## 2014-07-13 DIAGNOSIS — I10 Essential (primary) hypertension: Secondary | ICD-10-CM

## 2014-07-13 DIAGNOSIS — J069 Acute upper respiratory infection, unspecified: Secondary | ICD-10-CM | POA: Insufficient documentation

## 2014-07-13 DIAGNOSIS — B9789 Other viral agents as the cause of diseases classified elsewhere: Principal | ICD-10-CM

## 2014-07-13 LAB — CBC WITH DIFFERENTIAL/PLATELET
Basophils Absolute: 0 10*3/uL (ref 0.0–0.1)
Basophils Relative: 0.6 % (ref 0.0–3.0)
Eosinophils Absolute: 0.1 10*3/uL (ref 0.0–0.7)
Eosinophils Relative: 1.9 % (ref 0.0–5.0)
HCT: 38.8 % (ref 36.0–46.0)
Hemoglobin: 13.2 g/dL (ref 12.0–15.0)
Lymphocytes Relative: 29 % (ref 12.0–46.0)
Lymphs Abs: 2.1 10*3/uL (ref 0.7–4.0)
MCHC: 33.9 g/dL (ref 30.0–36.0)
MCV: 80.7 fl (ref 78.0–100.0)
Monocytes Absolute: 0.8 10*3/uL (ref 0.1–1.0)
Monocytes Relative: 11.6 % (ref 3.0–12.0)
Neutro Abs: 4.1 10*3/uL (ref 1.4–7.7)
Neutrophils Relative %: 56.9 % (ref 43.0–77.0)
Platelets: 316 10*3/uL (ref 150.0–400.0)
RBC: 4.81 Mil/uL (ref 3.87–5.11)
RDW: 14.6 % (ref 11.5–15.5)
WBC: 7.3 10*3/uL (ref 4.0–10.5)

## 2014-07-13 LAB — BASIC METABOLIC PANEL
BUN: 8 mg/dL (ref 6–23)
CO2: 28 mEq/L (ref 19–32)
Calcium: 9.3 mg/dL (ref 8.4–10.5)
Chloride: 105 mEq/L (ref 96–112)
Creatinine, Ser: 0.58 mg/dL (ref 0.40–1.20)
GFR: 139.17 mL/min (ref >60.00)
Glucose, Bld: 102 mg/dL — ABNORMAL HIGH (ref 70–99)
Potassium: 3.8 mEq/L (ref 3.5–5.1)
Sodium: 138 mEq/L (ref 135–145)

## 2014-07-13 LAB — URINALYSIS, ROUTINE W REFLEX MICROSCOPIC
Bilirubin Urine: NEGATIVE
Ketones, ur: NEGATIVE
Leukocytes, UA: NEGATIVE
Nitrite: NEGATIVE
RBC / HPF: NONE SEEN (ref 0–?)
Specific Gravity, Urine: 1.01 (ref 1.000–1.030)
Total Protein, Urine: NEGATIVE
Urine Glucose: NEGATIVE
Urobilinogen, UA: 0.2 (ref 0.0–1.0)
pH: 7 (ref 5.0–8.0)

## 2014-07-13 LAB — TSH: TSH: 0.44 u[IU]/mL (ref 0.35–4.50)

## 2014-07-13 MED ORDER — CODEINE POLST-CHLORPHEN POLST 14.7-2.8 MG/5ML PO LQCR: 10.0000 mL | Freq: Two times a day (BID) | ORAL | Status: DC | PRN

## 2014-07-13 MED ORDER — NEBIVOLOL HCL 10 MG PO TABS: 10.0000 mg | ORAL_TABLET | Freq: Every day | ORAL | Status: DC

## 2014-07-13 NOTE — Patient Instructions (Signed)

## 2014-07-13 NOTE — Progress Notes (Signed)
   Subjective:    Patient ID: Betty Bell, female    DOB: [birth year], 54 y.o.   MRN: [id number]  Cough This is a new problem. The current episode started in the past 7 days. The problem has been gradually improving. The cough is non-productive. Associated symptoms include a sore throat. Pertinent negatives include no chest pain, chills, ear congestion, ear pain, fever, headaches, heartburn, hemoptysis, myalgias, nasal congestion, postnasal drip, rash, rhinorrhea, shortness of breath, sweats, weight loss or wheezing. She has tried OTC cough suppressant for the symptoms. The treatment provided mild relief. There is no history of asthma or pneumonia.      Review of Systems  Constitutional: Negative.  Negative for fever, chills, weight loss, diaphoresis and fatigue.  HENT: Positive for sore throat. Negative for ear pain, postnasal drip, rhinorrhea and trouble swallowing.   Eyes: Negative.   Respiratory: Positive for cough. Negative for hemoptysis, shortness of breath and wheezing.   Cardiovascular: Negative.  Negative for chest pain, palpitations and leg swelling.  Gastrointestinal: Negative.  Negative for heartburn, nausea, vomiting, abdominal pain, diarrhea, constipation and blood in stool.  Endocrine: Negative.   Genitourinary: Negative.  Negative for dysuria, frequency, hematuria, flank pain and difficulty urinating.  Musculoskeletal: Negative.  Negative for myalgias.  Skin: Negative.  Negative for rash.  Allergic/Immunologic: Negative.   Neurological: Negative.  Negative for headaches.  Hematological: Negative.  Negative for adenopathy. Does not bruise/bleed easily.  Psychiatric/Behavioral: Negative.        Objective:   Physical Exam  Constitutional: She is oriented to person, place, and time. She appears well-developed and well-nourished.  Non-toxic appearance. She does not have a sickly appearance. She does not appear ill. No distress.  HENT:  Head: Normocephalic and  atraumatic.  Mouth/Throat: Oropharynx is clear and moist. No oropharyngeal exudate.  Eyes: Conjunctivae are normal. Right eye exhibits no discharge. Left eye exhibits no discharge. No scleral icterus.  Neck: Normal range of motion. Neck supple. No JVD present. No tracheal deviation present. No thyromegaly present.  Cardiovascular: Normal rate, regular rhythm, normal heart sounds and intact distal pulses.  Exam reveals no gallop and no friction rub.   No murmur heard. Pulmonary/Chest: Effort normal and breath sounds normal. No stridor. No respiratory distress. She has no wheezes. She has no rales. She exhibits no tenderness.  Abdominal: Soft. Bowel sounds are normal. She exhibits no distension and no mass. There is no tenderness. There is no rebound and no guarding.  Musculoskeletal: Normal range of motion. She exhibits no edema or tenderness.  Lymphadenopathy:    She has no cervical adenopathy.  Neurological: She is oriented to person, place, and time.  Skin: Skin is warm and dry. No rash noted. She is not diaphoretic. No erythema. No pallor.  Vitals reviewed.    Lab Results  Component Value Date   WBC 9.5 03/06/2014   HGB 12.1 03/06/2014   HCT 36.5 03/06/2014   PLT 301.0 03/06/2014   GLUCOSE 96 03/06/2014   CHOL 168 11/16/2013   TRIG 86.0 11/16/2013   HDL 52.80 11/16/2013   LDLCALC 98 11/16/2013   ALT 12 03/01/2014   AST 18 03/01/2014   NA 138 03/06/2014   K 3.6 03/06/2014   CL 107 03/06/2014   CREATININE 0.7 03/06/2014   BUN 6 03/06/2014   CO2 18* 03/06/2014   TSH 0.54 11/16/2013       Assessment & Plan:

## 2014-07-13 NOTE — Progress Notes (Signed)
Pre visit review using our clinic review tool, if applicable. No additional management support is needed unless otherwise documented below in the visit note. 

## 2014-07-15 NOTE — Assessment & Plan Note (Signed)
This is viral so antibiotics were not given Will control the cough with tudorza She will let me know if her s/s persist or worsen

## 2014-07-15 NOTE — Assessment & Plan Note (Signed)
Her BP is not well controlled She was taking HCTZ but she did not like the frequent urination that it caused so she stopped it Will start Bystolic for BP control She will work on her lifestyle modifications

## 2014-07-16 ENCOUNTER — Telehealth: Payer: Self-pay | Admitting: Internal Medicine

## 2014-07-16 NOTE — Telephone Encounter (Signed)
emmi emailed °

## 2014-09-06 ENCOUNTER — Ambulatory Visit: Payer: 59

## 2014-09-06 VITALS — BP 128/72

## 2014-09-06 DIAGNOSIS — I10 Essential (primary) hypertension: Secondary | ICD-10-CM

## 2014-09-10 ENCOUNTER — Ambulatory Visit (INDEPENDENT_AMBULATORY_CARE_PROVIDER_SITE_OTHER): Payer: 59 | Admitting: Internal Medicine

## 2014-09-10 ENCOUNTER — Encounter: Payer: Self-pay | Admitting: Internal Medicine

## 2014-09-10 VITALS — BP 136/72 | HR 59 | Temp 98.2°F | Resp 16 | Ht 64.0 in | Wt 213.0 lb

## 2014-09-10 DIAGNOSIS — I10 Essential (primary) hypertension: Secondary | ICD-10-CM | POA: Diagnosis not present

## 2014-09-10 MED ORDER — NEBIVOLOL HCL 10 MG PO TABS: 10.0000 mg | ORAL_TABLET | Freq: Every day | ORAL | Status: DC

## 2014-09-10 NOTE — Patient Instructions (Signed)

## 2014-09-10 NOTE — Progress Notes (Signed)
Pre visit review using our clinic review tool, if applicable. No additional management support is needed unless otherwise documented below in the visit note. 

## 2014-09-10 NOTE — Assessment & Plan Note (Signed)
Her BP is well controlled Will cont bystolic She will work on her lifestyle modifications

## 2014-09-10 NOTE — Progress Notes (Signed)
   Subjective:    Patient ID: Betty Bell, female    DOB: 1960-10-06, 54 y.o.   MRN: [id number]  Hypertension This is a chronic problem. The current episode started more than 1 year ago. The problem has been gradually improving since onset. The problem is controlled. Pertinent negatives include no anxiety, blurred vision, chest pain, headaches, malaise/fatigue, neck pain, orthopnea, palpitations, peripheral edema, PND, shortness of breath or sweats. Past treatments include beta blockers. The current treatment provides significant improvement. Compliance problems include diet and exercise.       Review of Systems  Constitutional: Negative.  Negative for fever, chills, malaise/fatigue, diaphoresis, appetite change and fatigue.  HENT: Negative.   Eyes: Negative.  Negative for blurred vision.  Respiratory: Negative.  Negative for cough, choking, shortness of breath and stridor.   Cardiovascular: Negative.  Negative for chest pain, palpitations, orthopnea, leg swelling and PND.  Gastrointestinal: Negative.  Negative for nausea, vomiting, abdominal pain, diarrhea and constipation.  Endocrine: Negative.   Genitourinary: Negative.   Musculoskeletal: Negative.  Negative for back pain, arthralgias and neck pain.  Skin: Negative.   Allergic/Immunologic: Negative.   Neurological: Negative.  Negative for headaches.  Hematological: Negative.  Negative for adenopathy. Does not bruise/bleed easily.  Psychiatric/Behavioral: Negative.        Objective:   Physical Exam  Constitutional: She is oriented to person, place, and time. She appears well-developed and well-nourished. No distress.  HENT:  Head: Normocephalic and atraumatic.  Mouth/Throat: Oropharynx is clear and moist. No oropharyngeal exudate.  Eyes: Conjunctivae are normal. Right eye exhibits no discharge. Left eye exhibits no discharge. No scleral icterus.  Neck: Normal range of motion. Neck supple. No JVD present. No tracheal deviation  present. No thyromegaly present.  Cardiovascular: Normal rate, regular rhythm, normal heart sounds and intact distal pulses.  Exam reveals no gallop and no friction rub.   No murmur heard. Pulmonary/Chest: Effort normal and breath sounds normal. No stridor. No respiratory distress. She has no wheezes. She has no rales. She exhibits no tenderness.  Abdominal: Soft. Bowel sounds are normal. She exhibits no distension and no mass. There is no tenderness. There is no rebound and no guarding.  Musculoskeletal: Normal range of motion. She exhibits no edema or tenderness.  Lymphadenopathy:    She has no cervical adenopathy.  Neurological: She is oriented to person, place, and time.  Skin: Skin is warm and dry. No rash noted. She is not diaphoretic. No erythema. No pallor.  Vitals reviewed.    Lab Results  Component Value Date   WBC 7.3 07/13/2014   HGB 13.2 07/13/2014   HCT 38.8 07/13/2014   PLT 316.0 07/13/2014   GLUCOSE 102* 07/13/2014   CHOL 168 11/16/2013   TRIG 86.0 11/16/2013   HDL 52.80 11/16/2013   LDLCALC 98 11/16/2013   ALT 12 03/01/2014   AST 18 03/01/2014   NA 138 07/13/2014   K 3.8 07/13/2014   CL 105 07/13/2014   CREATININE 0.58 07/13/2014   BUN 8 07/13/2014   CO2 28 07/13/2014   TSH 0.44 07/13/2014       Assessment & Plan:

## 2014-11-19 ENCOUNTER — Encounter: Payer: 59 | Admitting: Internal Medicine

## 2014-12-13 ENCOUNTER — Other Ambulatory Visit (INDEPENDENT_AMBULATORY_CARE_PROVIDER_SITE_OTHER): Payer: 59

## 2014-12-13 ENCOUNTER — Encounter: Payer: Self-pay | Admitting: Internal Medicine

## 2014-12-13 ENCOUNTER — Ambulatory Visit (INDEPENDENT_AMBULATORY_CARE_PROVIDER_SITE_OTHER): Payer: 59 | Admitting: Internal Medicine

## 2014-12-13 VITALS — BP 120/82 | HR 77 | Temp 97.8°F | Resp 16 | Ht 64.0 in | Wt 217.0 lb

## 2014-12-13 DIAGNOSIS — Z0000 Encounter for general adult medical examination without abnormal findings: Secondary | ICD-10-CM

## 2014-12-13 DIAGNOSIS — E669 Obesity, unspecified: Secondary | ICD-10-CM | POA: Insufficient documentation

## 2014-12-13 LAB — CBC WITH DIFFERENTIAL/PLATELET
Basophils Absolute: 0.1 10*3/uL (ref 0.0–0.1)
Basophils Relative: 0.7 % (ref 0.0–3.0)
Eosinophils Absolute: 0.1 10*3/uL (ref 0.0–0.7)
Eosinophils Relative: 1.1 % (ref 0.0–5.0)
HCT: 40.7 % (ref 36.0–46.0)
Hemoglobin: 13.5 g/dL (ref 12.0–15.0)
Lymphocytes Relative: 34.2 % (ref 12.0–46.0)
Lymphs Abs: 2.8 10*3/uL (ref 0.7–4.0)
MCHC: 33.3 g/dL (ref 30.0–36.0)
MCV: 82.6 fl (ref 78.0–100.0)
Monocytes Absolute: 0.7 10*3/uL (ref 0.1–1.0)
Monocytes Relative: 8.5 % (ref 3.0–12.0)
Neutro Abs: 4.5 10*3/uL (ref 1.4–7.7)
Neutrophils Relative %: 55.5 % (ref 43.0–77.0)
Platelets: 330 10*3/uL (ref 150.0–400.0)
RBC: 4.92 Mil/uL (ref 3.87–5.11)
RDW: 14.4 % (ref 11.5–15.5)
WBC: 8.2 10*3/uL (ref 4.0–10.5)

## 2014-12-13 LAB — COMPREHENSIVE METABOLIC PANEL
ALT: 48 U/L — ABNORMAL HIGH (ref 0–35)
AST: 33 U/L (ref 0–37)
Albumin: 4.1 g/dL (ref 3.5–5.2)
Alkaline Phosphatase: 118 U/L — ABNORMAL HIGH (ref 39–117)
BUN: 8 mg/dL (ref 6–23)
CO2: 27 mEq/L (ref 19–32)
Calcium: 9.2 mg/dL (ref 8.4–10.5)
Chloride: 103 mEq/L (ref 96–112)
Creatinine, Ser: 0.61 mg/dL (ref 0.40–1.20)
GFR: 131.1 mL/min (ref >60.00)
Glucose, Bld: 91 mg/dL (ref 70–99)
Potassium: 3.8 mEq/L (ref 3.5–5.1)
Sodium: 138 mEq/L (ref 135–145)
Total Bilirubin: 0.3 mg/dL (ref 0.2–1.2)
Total Protein: 7.5 g/dL (ref 6.0–8.3)

## 2014-12-13 LAB — LIPID PANEL
Cholesterol: 177 mg/dL (ref 0–200)
HDL: 52.9 mg/dL (ref >39.00)
LDL Cholesterol: 103 mg/dL — ABNORMAL HIGH (ref 0–99)
NonHDL: 124.19
Total CHOL/HDL Ratio: 3
Triglycerides: 104 mg/dL (ref 0.0–149.0)
VLDL: 20.8 mg/dL (ref 0.0–40.0)

## 2014-12-13 LAB — TSH: TSH: 0.48 u[IU]/mL (ref 0.35–4.50)

## 2014-12-13 NOTE — Patient Instructions (Signed)
Preventive Care for Adults A healthy lifestyle and preventive care can promote health and wellness. Preventive health guidelines for women include the following key practices.  A routine yearly physical is a good way to check with your health care provider about your health and preventive screening. It is a chance to share any concerns and updates on your health and to receive a thorough exam.  Visit your dentist for a routine exam and preventive care every 6 months. Brush your teeth twice a day and floss once a day. Good oral hygiene prevents tooth decay and gum disease.  The frequency of eye exams is based on your age, health, family medical history, use of contact lenses, and other factors. Follow your health care provider's recommendations for frequency of eye exams.  Eat a healthy diet. Foods like vegetables, fruits, whole grains, low-fat dairy products, and lean protein foods contain the nutrients you need without too many calories. Decrease your intake of foods high in solid fats, added sugars, and salt. Eat the right amount of calories for you.Get information about a proper diet from your health care provider, if necessary.  Regular physical exercise is one of the most important things you can do for your health. Most adults should get at least 150 minutes of moderate-intensity exercise (any activity that increases your heart rate and causes you to sweat) each week. In addition, most adults need muscle-strengthening exercises on 2 or more days a week.  Maintain a healthy weight. The body mass index (BMI) is a screening tool to identify possible weight problems. It provides an estimate of body fat based on height and weight. Your health care provider can find your BMI and can help you achieve or maintain a healthy weight.For adults 20 years and older:  A BMI below 18.5 is considered underweight.  A BMI of 18.5 to 24.9 is normal.  A BMI of 25 to 29.9 is considered overweight.  A BMI of  30 and above is considered obese.  Maintain normal blood lipids and cholesterol levels by exercising and minimizing your intake of saturated fat. Eat a balanced diet with plenty of fruit and vegetables. Blood tests for lipids and cholesterol should begin at age 76 and be repeated every 5 years. If your lipid or cholesterol levels are high, you are over 50, or you are at high risk for heart disease, you may need your cholesterol levels checked more frequently.Ongoing high lipid and cholesterol levels should be treated with medicines if diet and exercise are not working.  If you smoke, find out from your health care provider how to quit. If you do not use tobacco, do not start.  Lung cancer screening is recommended for adults aged 22-80 years who are at high risk for developing lung cancer because of a history of smoking. A yearly low-dose CT scan of the lungs is recommended for people who have at least a 30-pack-year history of smoking and are a current smoker or have quit within the past 15 years. A pack year of smoking is smoking an average of 1 pack of cigarettes a day for 1 year (for example: 1 pack a day for 30 years or 2 packs a day for 15 years). Yearly screening should continue until the smoker has stopped smoking for at least 15 years. Yearly screening should be stopped for people who develop a health problem that would prevent them from having lung cancer treatment.  If you are pregnant, do not drink alcohol. If you are breastfeeding,  be very cautious about drinking alcohol. If you are not pregnant and choose to drink alcohol, do not have more than 1 drink per day. One drink is considered to be 12 ounces (355 mL) of beer, 5 ounces (148 mL) of wine, or 1.5 ounces (44 mL) of liquor.  Avoid use of street drugs. Do not share needles with anyone. Ask for help if you need support or instructions about stopping the use of drugs.  High blood pressure causes heart disease and increases the risk of  stroke. Your blood pressure should be checked at least every 1 to 2 years. Ongoing high blood pressure should be treated with medicines if weight loss and exercise do not work.  If you are 75-52 years old, ask your health care provider if you should take aspirin to prevent strokes.  Diabetes screening involves taking a blood sample to check your fasting blood sugar level. This should be done once every 3 years, after age 15, if you are within normal weight and without risk factors for diabetes. Testing should be considered at a younger age or be carried out more frequently if you are overweight and have at least 1 risk factor for diabetes.  Breast cancer screening is essential preventive care for women. You should practice "breast self-awareness." This means understanding the normal appearance and feel of your breasts and may include breast self-examination. Any changes detected, no matter how small, should be reported to a health care provider. Women in their 58s and 30s should have a clinical breast exam (CBE) by a health care provider as part of a regular health exam every 1 to 3 years. After age 16, women should have a CBE every year. Starting at age 53, women should consider having a mammogram (breast X-ray test) every year. Women who have a family history of breast cancer should talk to their health care provider about genetic screening. Women at a high risk of breast cancer should talk to their health care providers about having an MRI and a mammogram every year.  Breast cancer gene (BRCA)-related cancer risk assessment is recommended for women who have family members with BRCA-related cancers. BRCA-related cancers include breast, ovarian, tubal, and peritoneal cancers. Having family members with these cancers may be associated with an increased risk for harmful changes (mutations) in the breast cancer genes BRCA1 and BRCA2. Results of the assessment will determine the need for genetic counseling and  BRCA1 and BRCA2 testing.  Routine pelvic exams to screen for cancer are no longer recommended for nonpregnant women who are considered low risk for cancer of the pelvic organs (ovaries, uterus, and vagina) and who do not have symptoms. Ask your health care provider if a screening pelvic exam is right for you.  If you have had past treatment for cervical cancer or a condition that could lead to cancer, you need Pap tests and screening for cancer for at least 20 years after your treatment. If Pap tests have been discontinued, your risk factors (such as having a new sexual partner) need to be reassessed to determine if screening should be resumed. Some women have medical problems that increase the chance of getting cervical cancer. In these cases, your health care provider may recommend more frequent screening and Pap tests.  The HPV test is an additional test that may be used for cervical cancer screening. The HPV test looks for the virus that can cause the cell changes on the cervix. The cells collected during the Pap test can be  tested for HPV. The HPV test could be used to screen women aged 30 years and older, and should be used in women of any age who have unclear Pap test results. After the age of 30, women should have HPV testing at the same frequency as a Pap test.  Colorectal cancer can be detected and often prevented. Most routine colorectal cancer screening begins at the age of 50 years and continues through age 75 years. However, your health care provider may recommend screening at an earlier age if you have risk factors for colon cancer. On a yearly basis, your health care provider may provide home test kits to check for hidden blood in the stool. Use of a small camera at the end of a tube, to directly examine the colon (sigmoidoscopy or colonoscopy), can detect the earliest forms of colorectal cancer. Talk to your health care provider about this at age 50, when routine screening begins. Direct  exam of the colon should be repeated every 5-10 years through age 75 years, unless early forms of pre-cancerous polyps or small growths are found.  People who are at an increased risk for hepatitis B should be screened for this virus. You are considered at high risk for hepatitis B if:  You were born in a country where hepatitis B occurs often. Talk with your health care provider about which countries are considered high risk.  Your parents were born in a high-risk country and you have not received a shot to protect against hepatitis B (hepatitis B vaccine).  You have HIV or AIDS.  You use needles to inject street drugs.  You live with, or have sex with, someone who has hepatitis B.  You get hemodialysis treatment.  You take certain medicines for conditions like cancer, organ transplantation, and autoimmune conditions.  Hepatitis C blood testing is recommended for all people born from 1945 through 1965 and any individual with known risks for hepatitis C.  Practice safe sex. Use condoms and avoid high-risk sexual practices to reduce the spread of sexually transmitted infections (STIs). STIs include gonorrhea, chlamydia, syphilis, trichomonas, herpes, HPV, and human immunodeficiency virus (HIV). Herpes, HIV, and HPV are viral illnesses that have no cure. They can result in disability, cancer, and death.  You should be screened for sexually transmitted illnesses (STIs) including gonorrhea and chlamydia if:  You are sexually active and are younger than 24 years.  You are older than 24 years and your health care provider tells you that you are at risk for this type of infection.  Your sexual activity has changed since you were last screened and you are at an increased risk for chlamydia or gonorrhea. Ask your health care provider if you are at risk.  If you are at risk of being infected with HIV, it is recommended that you take a prescription medicine daily to prevent HIV infection. This is  called preexposure prophylaxis (PrEP). You are considered at risk if:  You are a heterosexual woman, are sexually active, and are at increased risk for HIV infection.  You take drugs by injection.  You are sexually active with a partner who has HIV.  Talk with your health care provider about whether you are at high risk of being infected with HIV. If you choose to begin PrEP, you should first be tested for HIV. You should then be tested every 3 months for as long as you are taking PrEP.  Osteoporosis is a disease in which the bones lose minerals and strength   with aging. This can result in serious bone fractures or breaks. The risk of osteoporosis can be identified using a bone density scan. Women ages 65 years and over and women at risk for fractures or osteoporosis should discuss screening with their health care providers. Ask your health care provider whether you should take a calcium supplement or vitamin D to reduce the rate of osteoporosis.  Menopause can be associated with physical symptoms and risks. Hormone replacement therapy is available to decrease symptoms and risks. You should talk to your health care provider about whether hormone replacement therapy is right for you.  Use sunscreen. Apply sunscreen liberally and repeatedly throughout the day. You should seek shade when your shadow is shorter than you. Protect yourself by wearing long sleeves, pants, a wide-brimmed hat, and sunglasses year round, whenever you are outdoors.  Once a month, do a whole body skin exam, using a mirror to look at the skin on your back. Tell your health care provider of new moles, moles that have irregular borders, moles that are larger than a pencil eraser, or moles that have changed in shape or color.  Stay current with required vaccines (immunizations).  Influenza vaccine. All adults should be immunized every year.  Tetanus, diphtheria, and acellular pertussis (Td, Tdap) vaccine. Pregnant women should  receive 1 dose of Tdap vaccine during each pregnancy. The dose should be obtained regardless of the length of time since the last dose. Immunization is preferred during the 27th-36th week of gestation. An adult who has not previously received Tdap or who does not know her vaccine status should receive 1 dose of Tdap. This initial dose should be followed by tetanus and diphtheria toxoids (Td) booster doses every 10 years. Adults with an unknown or incomplete history of completing a 3-dose immunization series with Td-containing vaccines should begin or complete a primary immunization series including a Tdap dose. Adults should receive a Td booster every 10 years.  Varicella vaccine. An adult without evidence of immunity to varicella should receive 2 doses or a second dose if she has previously received 1 dose. Pregnant females who do not have evidence of immunity should receive the first dose after pregnancy. This first dose should be obtained before leaving the health care facility. The second dose should be obtained 4-8 weeks after the first dose.  Human papillomavirus (HPV) vaccine. Females aged 13-26 years who have not received the vaccine previously should obtain the 3-dose series. The vaccine is not recommended for use in pregnant females. However, pregnancy testing is not needed before receiving a dose. If a female is found to be pregnant after receiving a dose, no treatment is needed. In that case, the remaining doses should be delayed until after the pregnancy. Immunization is recommended for any person with an immunocompromised condition through the age of 26 years if she did not get any or all doses earlier. During the 3-dose series, the second dose should be obtained 4-8 weeks after the first dose. The third dose should be obtained 24 weeks after the first dose and 16 weeks after the second dose.  Zoster vaccine. One dose is recommended for adults aged 60 years or older unless certain conditions are  present.  Measles, mumps, and rubella (MMR) vaccine. Adults born before 1957 generally are considered immune to measles and mumps. Adults born in 1957 or later should have 1 or more doses of MMR vaccine unless there is a contraindication to the vaccine or there is laboratory evidence of immunity to   each of the three diseases. A routine second dose of MMR vaccine should be obtained at least 28 days after the first dose for students attending postsecondary schools, health care workers, or international travelers. People who received inactivated measles vaccine or an unknown type of measles vaccine during 1963-1967 should receive 2 doses of MMR vaccine. People who received inactivated mumps vaccine or an unknown type of mumps vaccine before 1979 and are at high risk for mumps infection should consider immunization with 2 doses of MMR vaccine. For females of childbearing age, rubella immunity should be determined. If there is no evidence of immunity, females who are not pregnant should be vaccinated. If there is no evidence of immunity, females who are pregnant should delay immunization until after pregnancy. Unvaccinated health care workers born before 1957 who lack laboratory evidence of measles, mumps, or rubella immunity or laboratory confirmation of disease should consider measles and mumps immunization with 2 doses of MMR vaccine or rubella immunization with 1 dose of MMR vaccine.  Pneumococcal 13-valent conjugate (PCV13) vaccine. When indicated, a person who is uncertain of her immunization history and has no record of immunization should receive the PCV13 vaccine. An adult aged 19 years or older who has certain medical conditions and has not been previously immunized should receive 1 dose of PCV13 vaccine. This PCV13 should be followed with a dose of pneumococcal polysaccharide (PPSV23) vaccine. The PPSV23 vaccine dose should be obtained at least 8 weeks after the dose of PCV13 vaccine. An adult aged 19  years or older who has certain medical conditions and previously received 1 or more doses of PPSV23 vaccine should receive 1 dose of PCV13. The PCV13 vaccine dose should be obtained 1 or more years after the last PPSV23 vaccine dose.  Pneumococcal polysaccharide (PPSV23) vaccine. When PCV13 is also indicated, PCV13 should be obtained first. All adults aged 65 years and older should be immunized. An adult younger than age 65 years who has certain medical conditions should be immunized. Any person who resides in a nursing home or long-term care facility should be immunized. An adult smoker should be immunized. People with an immunocompromised condition and certain other conditions should receive both PCV13 and PPSV23 vaccines. People with human immunodeficiency virus (HIV) infection should be immunized as soon as possible after diagnosis. Immunization during chemotherapy or radiation therapy should be avoided. Routine use of PPSV23 vaccine is not recommended for American Indians, [place] Natives, or people younger than 65 years unless there are medical conditions that require PPSV23 vaccine. When indicated, people who have unknown immunization and have no record of immunization should receive PPSV23 vaccine. One-time revaccination 5 years after the first dose of PPSV23 is recommended for people aged 19-64 years who have chronic kidney failure, nephrotic syndrome, asplenia, or immunocompromised conditions. People who received 1-2 doses of PPSV23 before age 65 years should receive another dose of PPSV23 vaccine at age 65 years or later if at least 5 years have passed since the previous dose. Doses of PPSV23 are not needed for people immunized with PPSV23 at or after age 65 years.  Meningococcal vaccine. Adults with asplenia or persistent complement component deficiencies should receive 2 doses of quadrivalent meningococcal conjugate (MenACWY-D) vaccine. The doses should be obtained at least 2 months apart.  Microbiologists working with certain meningococcal bacteria, military recruits, people at risk during an outbreak, and people who travel to or live in countries with a high rate of meningitis should be immunized. A first-year college student up through age   21 years who is living in a residence hall should receive a dose if she did not receive a dose on or after her 16th birthday. Adults who have certain high-risk conditions should receive one or more doses of vaccine.  Hepatitis A vaccine. Adults who wish to be protected from this disease, have certain high-risk conditions, work with hepatitis A-infected animals, work in hepatitis A research labs, or travel to or work in countries with a high rate of hepatitis A should be immunized. Adults who were previously unvaccinated and who anticipate close contact with an international adoptee during the first 60 days after arrival in the [place] from a country with a high rate of hepatitis A should be immunized.  Hepatitis B vaccine. Adults who wish to be protected from this disease, have certain high-risk conditions, may be exposed to blood or other infectious body fluids, are household contacts or sex partners of hepatitis B positive people, are clients or workers in certain care facilities, or travel to or work in countries with a high rate of hepatitis B should be immunized.  Haemophilus influenzae type b (Hib) vaccine. A previously unvaccinated person with asplenia or sickle cell disease or having a scheduled splenectomy should receive 1 dose of Hib vaccine. Regardless of previous immunization, a recipient of a hematopoietic stem cell transplant should receive a 3-dose series 6-12 months after her successful transplant. Hib vaccine is not recommended for adults with HIV infection. Preventive Services / Frequency Ages 64 to 68 years  Blood pressure check.** / Every 1 to 2 years.  Lipid and cholesterol check.** / Every 5 years beginning at age  22.  Clinical breast exam.** / Every 3 years for women in their 88s and 53s.  BRCA-related cancer risk assessment.** / For women who have family members with a BRCA-related cancer (breast, ovarian, tubal, or peritoneal cancers).  Pap test.** / Every 2 years from ages 90 through 51. Every 3 years starting at age 21 through age 56 or 3 with a history of 3 consecutive normal Pap tests.  HPV screening.** / Every 3 years from ages 24 through ages 1 to 46 with a history of 3 consecutive normal Pap tests.  Hepatitis C blood test.** / For any individual with known risks for hepatitis C.  Skin self-exam. / Monthly.  Influenza vaccine. / Every year.  Tetanus, diphtheria, and acellular pertussis (Tdap, Td) vaccine.** / Consult your health care provider. Pregnant women should receive 1 dose of Tdap vaccine during each pregnancy. 1 dose of Td every 10 years.  Varicella vaccine.** / Consult your health care provider. Pregnant females who do not have evidence of immunity should receive the first dose after pregnancy.  HPV vaccine. / 3 doses over 6 months, if 72 and younger. The vaccine is not recommended for use in pregnant females. However, pregnancy testing is not needed before receiving a dose.  Measles, mumps, rubella (MMR) vaccine.** / You need at least 1 dose of MMR if you were born in 1957 or later. You may also need a 2nd dose. For females of childbearing age, rubella immunity should be determined. If there is no evidence of immunity, females who are not pregnant should be vaccinated. If there is no evidence of immunity, females who are pregnant should delay immunization until after pregnancy.  Pneumococcal 13-valent conjugate (PCV13) vaccine.** / Consult your health care provider.  Pneumococcal polysaccharide (PPSV23) vaccine.** / 1 to 2 doses if you smoke cigarettes or if you have certain conditions.  Meningococcal vaccine.** /  1 dose if you are age 19 to 21 years and a first-year college  student living in a residence hall, or have one of several medical conditions, you need to get vaccinated against meningococcal disease. You may also need additional booster doses.  Hepatitis A vaccine.** / Consult your health care provider.  Hepatitis B vaccine.** / Consult your health care provider.  Haemophilus influenzae type b (Hib) vaccine.** / Consult your health care provider. Ages 40 to 64 years  Blood pressure check.** / Every 1 to 2 years.  Lipid and cholesterol check.** / Every 5 years beginning at age 20 years.  Lung cancer screening. / Every year if you are aged 55-80 years and have a 30-pack-year history of smoking and currently smoke or have quit within the past 15 years. Yearly screening is stopped once you have quit smoking for at least 15 years or develop a health problem that would prevent you from having lung cancer treatment.  Clinical breast exam.** / Every year after age 40 years.  BRCA-related cancer risk assessment.** / For women who have family members with a BRCA-related cancer (breast, ovarian, tubal, or peritoneal cancers).  Mammogram.** / Every year beginning at age 40 years and continuing for as long as you are in good health. Consult with your health care provider.  Pap test.** / Every 3 years starting at age 30 years through age 65 or 70 years with a history of 3 consecutive normal Pap tests.  HPV screening.** / Every 3 years from ages 30 years through ages 65 to 70 years with a history of 3 consecutive normal Pap tests.  Fecal occult blood test (FOBT) of stool. / Every year beginning at age 50 years and continuing until age 75 years. You may not need to do this test if you get a colonoscopy every 10 years.  Flexible sigmoidoscopy or colonoscopy.** / Every 5 years for a flexible sigmoidoscopy or every 10 years for a colonoscopy beginning at age 50 years and continuing until age 75 years.  Hepatitis C blood test.** / For all people born from 1945 through  1965 and any individual with known risks for hepatitis C.  Skin self-exam. / Monthly.  Influenza vaccine. / Every year.  Tetanus, diphtheria, and acellular pertussis (Tdap/Td) vaccine.** / Consult your health care provider. Pregnant women should receive 1 dose of Tdap vaccine during each pregnancy. 1 dose of Td every 10 years.  Varicella vaccine.** / Consult your health care provider. Pregnant females who do not have evidence of immunity should receive the first dose after pregnancy.  Zoster vaccine.** / 1 dose for adults aged 60 years or older.  Measles, mumps, rubella (MMR) vaccine.** / You need at least 1 dose of MMR if you were born in 1957 or later. You may also need a 2nd dose. For females of childbearing age, rubella immunity should be determined. If there is no evidence of immunity, females who are not pregnant should be vaccinated. If there is no evidence of immunity, females who are pregnant should delay immunization until after pregnancy.  Pneumococcal 13-valent conjugate (PCV13) vaccine.** / Consult your health care provider.  Pneumococcal polysaccharide (PPSV23) vaccine.** / 1 to 2 doses if you smoke cigarettes or if you have certain conditions.  Meningococcal vaccine.** / Consult your health care provider.  Hepatitis A vaccine.** / Consult your health care provider.  Hepatitis B vaccine.** / Consult your health care provider.  Haemophilus influenzae type b (Hib) vaccine.** / Consult your health care provider. Ages 65   years and over  Blood pressure check.** / Every 1 to 2 years.  Lipid and cholesterol check.** / Every 5 years beginning at age 22 years.  Lung cancer screening. / Every year if you are aged 73-80 years and have a 30-pack-year history of smoking and currently smoke or have quit within the past 15 years. Yearly screening is stopped once you have quit smoking for at least 15 years or develop a health problem that would prevent you from having lung cancer  treatment.  Clinical breast exam.** / Every year after age 4 years.  BRCA-related cancer risk assessment.** / For women who have family members with a BRCA-related cancer (breast, ovarian, tubal, or peritoneal cancers).  Mammogram.** / Every year beginning at age 40 years and continuing for as long as you are in good health. Consult with your health care provider.  Pap test.** / Every 3 years starting at age 9 years through age 34 or 91 years with 3 consecutive normal Pap tests. Testing can be stopped between 65 and 70 years with 3 consecutive normal Pap tests and no abnormal Pap or HPV tests in the past 10 years.  HPV screening.** / Every 3 years from ages 57 years through ages 64 or 45 years with a history of 3 consecutive normal Pap tests. Testing can be stopped between 65 and 70 years with 3 consecutive normal Pap tests and no abnormal Pap or HPV tests in the past 10 years.  Fecal occult blood test (FOBT) of stool. / Every year beginning at age 15 years and continuing until age 17 years. You may not need to do this test if you get a colonoscopy every 10 years.  Flexible sigmoidoscopy or colonoscopy.** / Every 5 years for a flexible sigmoidoscopy or every 10 years for a colonoscopy beginning at age 86 years and continuing until age 71 years.  Hepatitis C blood test.** / For all people born from [birth year] through 1966 and any individual with known risks for hepatitis C.  Osteoporosis screening.** / A one-time screening for women ages 83 years and over and women at risk for fractures or osteoporosis.  Skin self-exam. / Monthly.  Influenza vaccine. / Every year.  Tetanus, diphtheria, and acellular pertussis (Tdap/Td) vaccine.** / 1 dose of Td every 10 years.  Varicella vaccine.** / Consult your health care provider.  Zoster vaccine.** / 1 dose for adults aged 61 years or older.  Pneumococcal 13-valent conjugate (PCV13) vaccine.** / Consult your health care provider.  Pneumococcal  polysaccharide (PPSV23) vaccine.** / 1 dose for all adults aged 28 years and older.  Meningococcal vaccine.** / Consult your health care provider.  Hepatitis A vaccine.** / Consult your health care provider.  Hepatitis B vaccine.** / Consult your health care provider.  Haemophilus influenzae type b (Hib) vaccine.** / Consult your health care provider. ** Family history and personal history of risk and conditions may change your health care provider's recommendations. Document Released: 06/23/2001 Document Revised: 09/11/2013 Document Reviewed: 09/22/2010 [facility] Patient Information 2015 [place], [place]. This information is not intended to replace advice given to you by your health care provider. Make sure you discuss any questions you have with your health care provider.

## 2014-12-16 NOTE — Progress Notes (Signed)
Subjective:  Patient ID: Betty Bell, female    DOB: Sep 18, 1960  Age: 54 y.o. MRN: [id number]  CC: Hypertension and Annual Exam  Blood pressure check and physical. She feels well, her only complaint is some weight gain.Marland Kitchen HPI Betty Bell presents for CPX.  Outpatient Prescriptions Prior to Visit  Medication Sig Dispense Refill  . aspirin 81 MG tablet Take 81 mg by mouth once.    . ranitidine (ZANTAC) 150 MG tablet Take 150-300 mg by mouth 2 (two) times daily. 300mg  in the morning and 150mg  in the evening.    . nebivolol (BYSTOLIC) 10 MG tablet Take 1 tablet (10 mg total) by mouth daily. 30 tablet 11   No facility-administered medications prior to visit.    ROS Review of Systems  Constitutional: Positive for unexpected weight change. Negative for fever, chills, diaphoresis, activity change, appetite change and fatigue.  HENT: Negative.   Eyes: Negative.   Respiratory: Negative.  Negative for cough, choking, chest tightness, shortness of breath and stridor.   Cardiovascular: Negative.  Negative for chest pain, palpitations and leg swelling.  Gastrointestinal: Negative.  Negative for vomiting, abdominal pain, diarrhea and constipation.  Endocrine: Negative.   Genitourinary: Negative.   Musculoskeletal: Negative.   Skin: Negative.   Allergic/Immunologic: Negative.   Neurological: Negative.  Negative for dizziness, tremors, weakness, light-headedness and headaches.  Hematological: Negative.  Negative for adenopathy. Does not bruise/bleed easily.  Psychiatric/Behavioral: Negative.     Objective:  BP 120/82 mmHg  Pulse 77  Temp(Src) 97.8 F (36.6 C) (Oral)  Resp 16  Ht 5\' 4"  (1.626 m)  Wt 217 lb (98.431 kg)  BMI 37.23 kg/m2  SpO2 97%  LMP 03/26/2011  BP Readings from Last 3 Encounters:  12/13/14 120/82  09/10/14 136/72  09/06/14 128/72    Wt Readings from Last 3 Encounters:  12/13/14 217 lb (98.431 kg)  09/10/14 213 lb (96.616 kg)  07/13/14 212 lb 12 oz  (96.503 kg)    Physical Exam  Constitutional: She is oriented to person, place, and time. She appears well-developed and well-nourished. No distress.  HENT:  Mouth/Throat: Oropharynx is clear and moist. No oropharyngeal exudate.  Eyes: Conjunctivae are normal. Right eye exhibits no discharge. Left eye exhibits no discharge. No scleral icterus.  Neck: Normal range of motion. Neck supple. No JVD present. No tracheal deviation present. No thyromegaly present.  Cardiovascular: Normal rate, regular rhythm, normal heart sounds and intact distal pulses.  Exam reveals no gallop and no friction rub.   No murmur heard. Pulmonary/Chest: Effort normal and breath sounds normal. No stridor. No respiratory distress. She has no wheezes. She has no rales. She exhibits no tenderness.  Abdominal: Soft. Bowel sounds are normal. She exhibits no distension and no mass. There is no tenderness. There is no rebound and no guarding.  Musculoskeletal: Normal range of motion. She exhibits no edema or tenderness.  Lymphadenopathy:    She has no cervical adenopathy.  Neurological: She is oriented to person, place, and time.  Skin: Skin is warm and dry. No rash noted. She is not diaphoretic. No erythema. No pallor.  Vitals reviewed.   Lab Results  Component Value Date   WBC 8.2 12/13/2014   HGB 13.5 12/13/2014   HCT 40.7 12/13/2014   PLT 330.0 12/13/2014   GLUCOSE 91 12/13/2014   CHOL 177 12/13/2014   TRIG 104.0 12/13/2014   HDL 52.90 12/13/2014   LDLCALC 103* 12/13/2014   ALT 48* 12/13/2014   AST 33 12/13/2014   NA  138 12/13/2014   K 3.8 12/13/2014   CL 103 12/13/2014   CREATININE 0.61 12/13/2014   BUN 8 12/13/2014   CO2 27 12/13/2014   TSH 0.48 12/13/2014    Mm Digital Screening Bilateral  07/04/2014   CLINICAL DATA:  Screening.  EXAM: DIGITAL SCREENING BILATERAL MAMMOGRAM WITH CAD  COMPARISON:  Previous exam(s).  ACR Breast Density Category b: There are scattered areas of fibroglandular density.   FINDINGS: There are no findings suspicious for malignancy. Images were processed with CAD.  IMPRESSION: No mammographic evidence of malignancy. A result letter of this screening mammogram will be mailed directly to the patient.  RECOMMENDATION: Screening mammogram in one year. (Code:SM-B-01Y)  BI-RADS CATEGORY  1: Negative.   Electronically Signed   By: Everlean Alstrom M.D.   On: 07/04/2014 09:55    Assessment & Plan:   Betty Bell was seen today for hypertension and annual exam.  Diagnoses and all orders for this visit:  Obesity (BMI 35.0-39.9 without comorbidity) Orders: -     Amb Referral to Nutrition and Diabetic E  Routine general medical examination at a health care facility- exam done, labs ordered and reviewed. Her Pap smear and mammogram are up-to-date. Colonoscopy is up-to-date. She was given patient education material. Orders: -     Lipid panel; Future -     Comprehensive metabolic panel; Future -     CBC with Differential/Platelet; Future -     TSH; Future   I have discontinued Betty Bell's nebivolol. I am also having her maintain her ranitidine and aspirin.  No orders of the defined types were placed in this encounter.     Follow-up: Return in about 6 months (around 06/15/2015).  Scarlette Calico, MD

## 2015-06-25 ENCOUNTER — Ambulatory Visit (INDEPENDENT_AMBULATORY_CARE_PROVIDER_SITE_OTHER): Payer: 59 | Admitting: Internal Medicine

## 2015-06-25 ENCOUNTER — Encounter: Payer: Self-pay | Admitting: Internal Medicine

## 2015-06-25 VITALS — BP 142/98 | HR 74 | Temp 97.7°F | Resp 16 | Ht 64.0 in | Wt 215.0 lb

## 2015-06-25 DIAGNOSIS — K219 Gastro-esophageal reflux disease without esophagitis: Secondary | ICD-10-CM

## 2015-06-25 DIAGNOSIS — J988 Other specified respiratory disorders: Secondary | ICD-10-CM

## 2015-06-25 DIAGNOSIS — Z23 Encounter for immunization: Secondary | ICD-10-CM | POA: Diagnosis not present

## 2015-06-25 DIAGNOSIS — I10 Essential (primary) hypertension: Secondary | ICD-10-CM

## 2015-06-25 DIAGNOSIS — J22 Unspecified acute lower respiratory infection: Secondary | ICD-10-CM

## 2015-06-25 MED ORDER — AZITHROMYCIN 500 MG PO TABS: 500.0000 mg | ORAL_TABLET | Freq: Every day | ORAL | Status: DC

## 2015-06-25 MED ORDER — NEBIVOLOL HCL 5 MG PO TABS: 5.0000 mg | ORAL_TABLET | Freq: Every day | ORAL | Status: DC

## 2015-06-25 MED ORDER — RANITIDINE HCL 150 MG PO TABS: 150.0000 mg | ORAL_TABLET | Freq: Two times a day (BID) | ORAL | Status: DC

## 2015-06-25 MED ORDER — HYDROCODONE-HOMATROPINE 5-1.5 MG/5ML PO SYRP: 5.0000 mL | ORAL_SOLUTION | Freq: Three times a day (TID) | ORAL | Status: DC | PRN

## 2015-06-25 NOTE — Progress Notes (Signed)
Subjective:  Patient ID: Betty Bell, female    DOB: 1961/04/22  Age: 55 y.o. MRN: [id number]  CC: Cough and Hypertension   HPI Franka Mcdannel presents for a 5 day history of cough productive of thick yellow phlegm with headache, facial pain, muscle aches, sore throat and postnasal drip.  She also complains that her blood pressure is not been controlled. She is willing to start an antihypertensive medication.  Outpatient Prescriptions Prior to Visit  Medication Sig Dispense Refill  . aspirin 81 MG tablet Take 81 mg by mouth once.    . ranitidine (ZANTAC) 150 MG tablet Take 150-300 mg by mouth 2 (two) times daily. 300mg  in the morning and 150mg  in the evening.     No facility-administered medications prior to visit.    ROS Review of Systems  Constitutional: Negative.  Negative for fever, chills, diaphoresis, appetite change and fatigue.  HENT: Positive for sinus pressure and sore throat. Negative for trouble swallowing and voice change.   Eyes: Negative.   Respiratory: Positive for cough. Negative for apnea, choking, chest tightness, shortness of breath, wheezing and stridor.   Cardiovascular: Negative.  Negative for chest pain, palpitations and leg swelling.  Gastrointestinal: Negative.  Negative for nausea, vomiting, abdominal pain, diarrhea, constipation and blood in stool.  Endocrine: Negative.   Genitourinary: Negative.   Musculoskeletal: Negative.  Negative for myalgias, back pain, joint swelling and arthralgias.  Skin: Negative.  Negative for color change and rash.  Allergic/Immunologic: Negative.   Neurological: Positive for headaches.  Hematological: Negative.  Negative for adenopathy. Does not bruise/bleed easily.  Psychiatric/Behavioral: Negative.     Objective:  BP 142/98 mmHg  Pulse 74  Temp(Src) 97.7 F (36.5 C) (Oral)  Resp 16  Ht 5\' 4"  (1.626 m)  Wt 215 lb (97.523 kg)  BMI 36.89 kg/m2  SpO2 96%  PF 215 L/min  LMP 03/26/2011  BP Readings from  Last 3 Encounters:  06/25/15 142/98  12/13/14 120/82  09/10/14 136/72    Wt Readings from Last 3 Encounters:  06/25/15 215 lb (97.523 kg)  12/13/14 217 lb (98.431 kg)  09/10/14 213 lb (96.616 kg)    Physical Exam  Constitutional: She is oriented to person, place, and time. She appears well-developed and well-nourished.  Non-toxic appearance. She does not have a sickly appearance. She does not appear ill. No distress.  HENT:  Nose: No mucosal edema or rhinorrhea. Right sinus exhibits maxillary sinus tenderness. Right sinus exhibits no frontal sinus tenderness. Left sinus exhibits maxillary sinus tenderness. Left sinus exhibits no frontal sinus tenderness.  Mouth/Throat: Mucous membranes are normal. Mucous membranes are not pale, not dry and not cyanotic. No oral lesions. No trismus in the jaw. No uvula swelling. Posterior oropharyngeal erythema present. No oropharyngeal exudate, posterior oropharyngeal edema or tonsillar abscesses.  Eyes: Conjunctivae are normal. Right eye exhibits no discharge. Left eye exhibits no discharge. No scleral icterus.  Neck: Normal range of motion. Neck supple. No JVD present. No tracheal deviation present. No thyromegaly present.  Cardiovascular: Normal rate, regular rhythm, normal heart sounds and intact distal pulses.  Exam reveals no gallop and no friction rub.   No murmur heard. Pulmonary/Chest: Effort normal and breath sounds normal. No stridor. No respiratory distress. She has no wheezes. She has no rales. She exhibits no tenderness.  Abdominal: Soft. Bowel sounds are normal. She exhibits no distension and no mass. There is no tenderness. There is no rebound and no guarding.  Musculoskeletal: Normal range of motion. She exhibits no edema  or tenderness.  Lymphadenopathy:    She has no cervical adenopathy.  Neurological: She is oriented to person, place, and time.  Skin: Skin is warm and dry. No rash noted. She is not diaphoretic. No erythema. No pallor.     Lab Results  Component Value Date   WBC 8.2 12/13/2014   HGB 13.5 12/13/2014   HCT 40.7 12/13/2014   PLT 330.0 12/13/2014   GLUCOSE 91 12/13/2014   CHOL 177 12/13/2014   TRIG 104.0 12/13/2014   HDL 52.90 12/13/2014   LDLCALC 103* 12/13/2014   ALT 48* 12/13/2014   AST 33 12/13/2014   NA 138 12/13/2014   K 3.8 12/13/2014   CL 103 12/13/2014   CREATININE 0.61 12/13/2014   BUN 8 12/13/2014   CO2 27 12/13/2014   TSH 0.48 12/13/2014    Mm Digital Screening Bilateral  07/04/2014  CLINICAL DATA:  Screening. EXAM: DIGITAL SCREENING BILATERAL MAMMOGRAM WITH CAD COMPARISON:  Previous exam(s). ACR Breast Density Category b: There are scattered areas of fibroglandular density. FINDINGS: There are no findings suspicious for malignancy. Images were processed with CAD. IMPRESSION: No mammographic evidence of malignancy. A result letter of this screening mammogram will be mailed directly to the patient. RECOMMENDATION: Screening mammogram in one year. (Code:SM-B-01Y) BI-RADS CATEGORY  1: Negative. Electronically Signed   By: Everlean Alstrom M.D.   On: 07/04/2014 09:55    Assessment & Plan:   Ebonique was seen today for cough and hypertension.  Diagnoses and all orders for this visit:  Essential hypertension- her blood pressure is not well controlled and she is symptomatic with a headache, will start bystolic -     nebivolol (BYSTOLIC) 5 MG tablet; Take 1 tablet (5 mg total) by mouth daily.  Encounter for immunization -     Flu Vaccine QUAD 36+ mos IM  Gastroesophageal reflux disease without esophagitis- her symptoms are well controlled with an H2 blocker, will continue -     ranitidine (ZANTAC) 150 MG tablet; Take 1 tablet (150 mg total) by mouth 2 (two) times daily. 300mg  in the morning and 150mg  in the evening.  LRTI (lower respiratory tract infection)- I will treat the infection with Zithromax and will control the cough with Hycodan. -     azithromycin (ZITHROMAX) 500 MG tablet; Take  1 tablet (500 mg total) by mouth daily. -     HYDROcodone-homatropine (HYCODAN) 5-1.5 MG/5ML syrup; Take 5 mLs by mouth every 8 (eight) hours as needed for cough.   I have changed Ms. Caperton's ranitidine. I am also having her start on nebivolol, azithromycin, and HYDROcodone-homatropine. Additionally, I am having her maintain her aspirin.  Meds ordered this encounter  Medications  . nebivolol (BYSTOLIC) 5 MG tablet    Sig: Take 1 tablet (5 mg total) by mouth daily.    Dispense:  42 tablet    Refill:  0  . ranitidine (ZANTAC) 150 MG tablet    Sig: Take 1 tablet (150 mg total) by mouth 2 (two) times daily. 300mg  in the morning and 150mg  in the evening.    Dispense:  270 tablet    Refill:  3  . azithromycin (ZITHROMAX) 500 MG tablet    Sig: Take 1 tablet (500 mg total) by mouth daily.    Dispense:  3 tablet    Refill:  0  . HYDROcodone-homatropine (HYCODAN) 5-1.5 MG/5ML syrup    Sig: Take 5 mLs by mouth every 8 (eight) hours as needed for cough.    Dispense:  120  mL    Refill:  0     Follow-up: Return in about 4 weeks (around 07/23/2015).  Scarlette Calico, MD

## 2015-06-25 NOTE — Patient Instructions (Signed)
Hypertension Hypertension, commonly called high blood pressure, is when the force of blood pumping through your arteries is too strong. Your arteries are the blood vessels that carry blood from your heart throughout your body. A blood pressure reading consists of a higher number over a lower number, such as 110/72. The higher number (systolic) is the pressure inside your arteries when your heart pumps. The lower number (diastolic) is the pressure inside your arteries when your heart relaxes. Ideally you want your blood pressure below 120/80. Hypertension forces your heart to work harder to pump blood. Your arteries may become narrow or stiff. Having untreated or uncontrolled hypertension can cause heart attack, stroke, kidney disease, and other problems. RISK FACTORS Some risk factors for high blood pressure are controllable. Others are not.  Risk factors you cannot control include:   Race. You may be at higher risk if you are African American.  Age. Risk increases with age.  Gender. Men are at higher risk than women before age 45 years. After age 65, women are at higher risk than men. Risk factors you can control include:  Not getting enough exercise or physical activity.  Being overweight.  Getting too much fat, sugar, calories, or salt in your diet.  Drinking too much alcohol. SIGNS AND SYMPTOMS Hypertension does not usually cause signs or symptoms. Extremely high blood pressure (hypertensive crisis) may cause headache, anxiety, shortness of breath, and nosebleed. DIAGNOSIS To check if you have hypertension, your health care provider will measure your blood pressure while you are seated, with your arm held at the level of your heart. It should be measured at least twice using the same arm. Certain conditions can cause a difference in blood pressure between your right and left arms. A blood pressure reading that is higher than normal on one occasion does not mean that you need treatment. If  it is not clear whether you have high blood pressure, you may be asked to return on a different day to have your blood pressure checked again. Or, you may be asked to monitor your blood pressure at home for 1 or more weeks. TREATMENT Treating high blood pressure includes making lifestyle changes and possibly taking medicine. Living a healthy lifestyle can help lower high blood pressure. You may need to change some of your habits. Lifestyle changes may include:  Following the DASH diet. This diet is high in fruits, vegetables, and whole grains. It is low in salt, red meat, and added sugars.  Keep your sodium intake below 2,300 mg per day.  Getting at least 30-45 minutes of aerobic exercise at least 4 times per week.  Losing weight if necessary.  Not smoking.  Limiting alcoholic beverages.  Learning ways to reduce stress. Your health care provider may prescribe medicine if lifestyle changes are not enough to get your blood pressure under control, and if one of the following is true:  You are 18-59 years of age and your systolic blood pressure is above 140.  You are 60 years of age or older, and your systolic blood pressure is above 150.  Your diastolic blood pressure is above 90.  You have diabetes, and your systolic blood pressure is over 140 or your diastolic blood pressure is over 90.  You have kidney disease and your blood pressure is above 140/90.  You have heart disease and your blood pressure is above 140/90. Your personal target blood pressure may vary depending on your medical conditions, your age, and other factors. HOME CARE INSTRUCTIONS    Have your blood pressure rechecked as directed by your health care provider.   Take medicines only as directed by your health care provider. Follow the directions carefully. Blood pressure medicines must be taken as prescribed. The medicine does not work as well when you skip doses. Skipping doses also puts you at risk for  problems.  Do not smoke.   Monitor your blood pressure at home as directed by your health care provider. SEEK MEDICAL CARE IF:   You think you are having a reaction to medicines taken.  You have recurrent headaches or feel dizzy.  You have swelling in your ankles.  You have trouble with your vision. SEEK IMMEDIATE MEDICAL CARE IF:  You develop a severe headache or confusion.  You have unusual weakness, numbness, or feel faint.  You have severe chest or abdominal pain.  You vomit repeatedly.  You have trouble breathing. MAKE SURE YOU:   Understand these instructions.  Will watch your condition.  Will get help right away if you are not doing well or get worse.   This information is not intended to replace advice given to you by your health care provider. Make sure you discuss any questions you have with your health care provider.   Document Released: 04/27/2005 Document Revised: 09/11/2014 Document Reviewed: 02/17/2013 Elsevier Interactive Patient Education 2016 [employer]  

## 2015-08-31 IMAGING — MG MM DIGITAL SCREENING BILAT
4 series · 4 of 4 positions shown · non-contrast
Comparison: Previous exam(s).

CLINICAL DATA: Screening.

EXAM:
DIGITAL SCREENING BILATERAL MAMMOGRAM WITH CAD

[R CC]
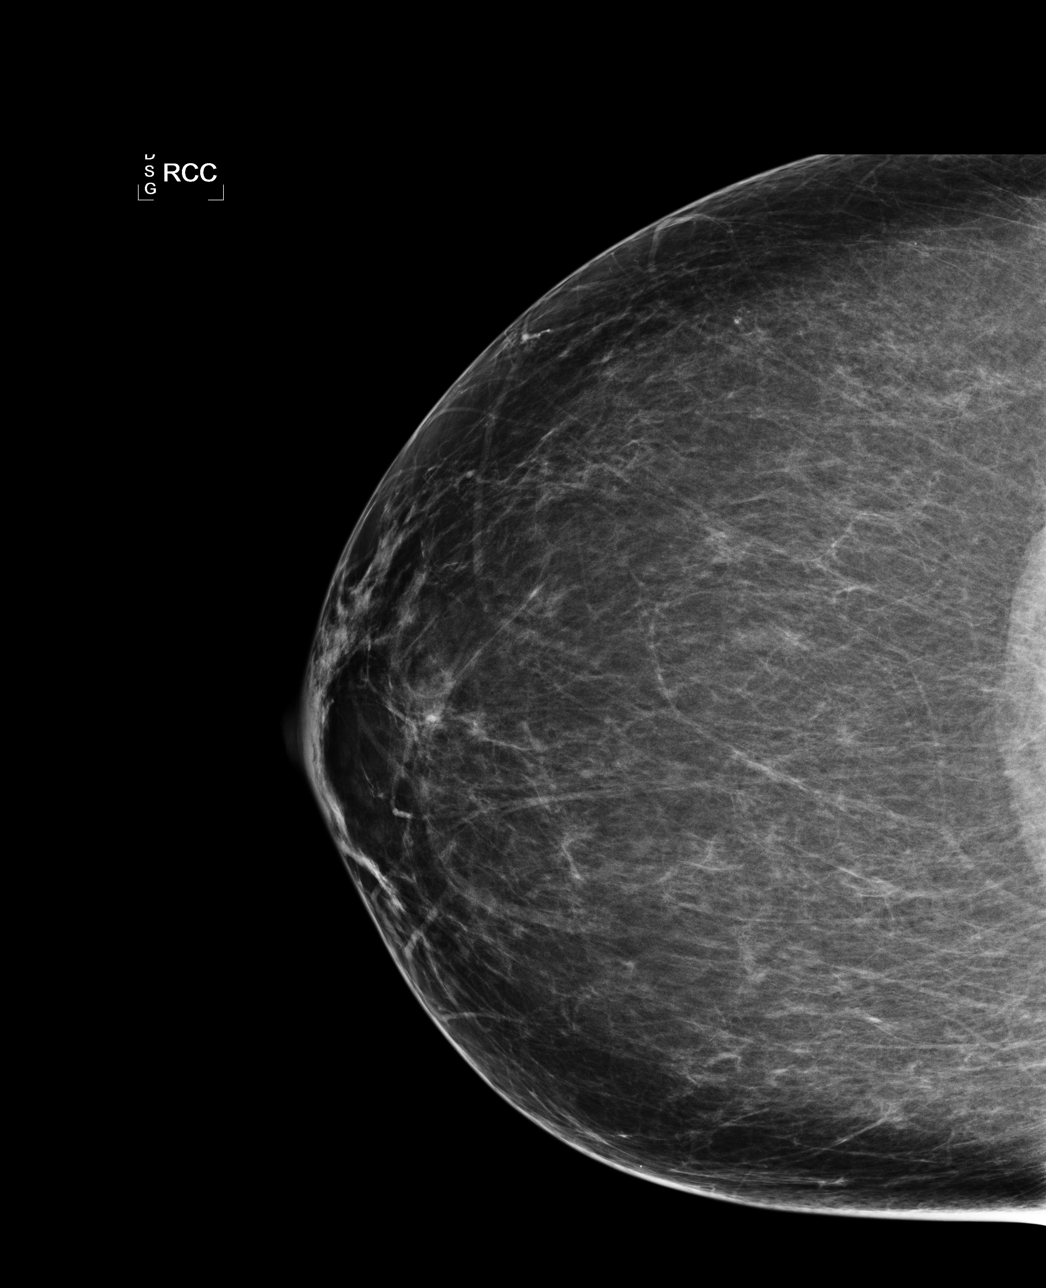

[R MLO]
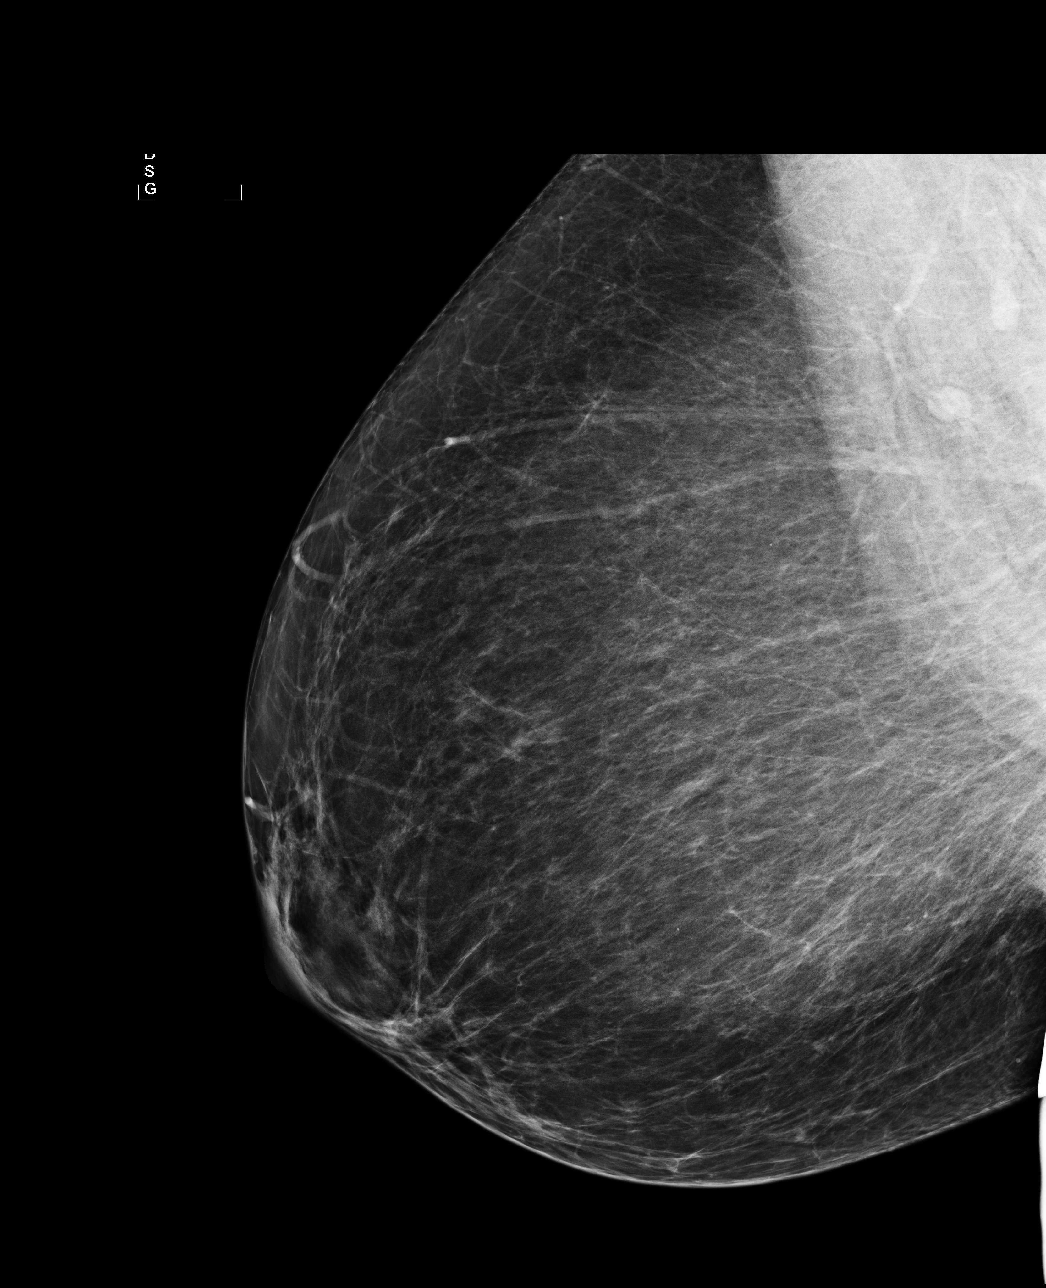

[L CC]
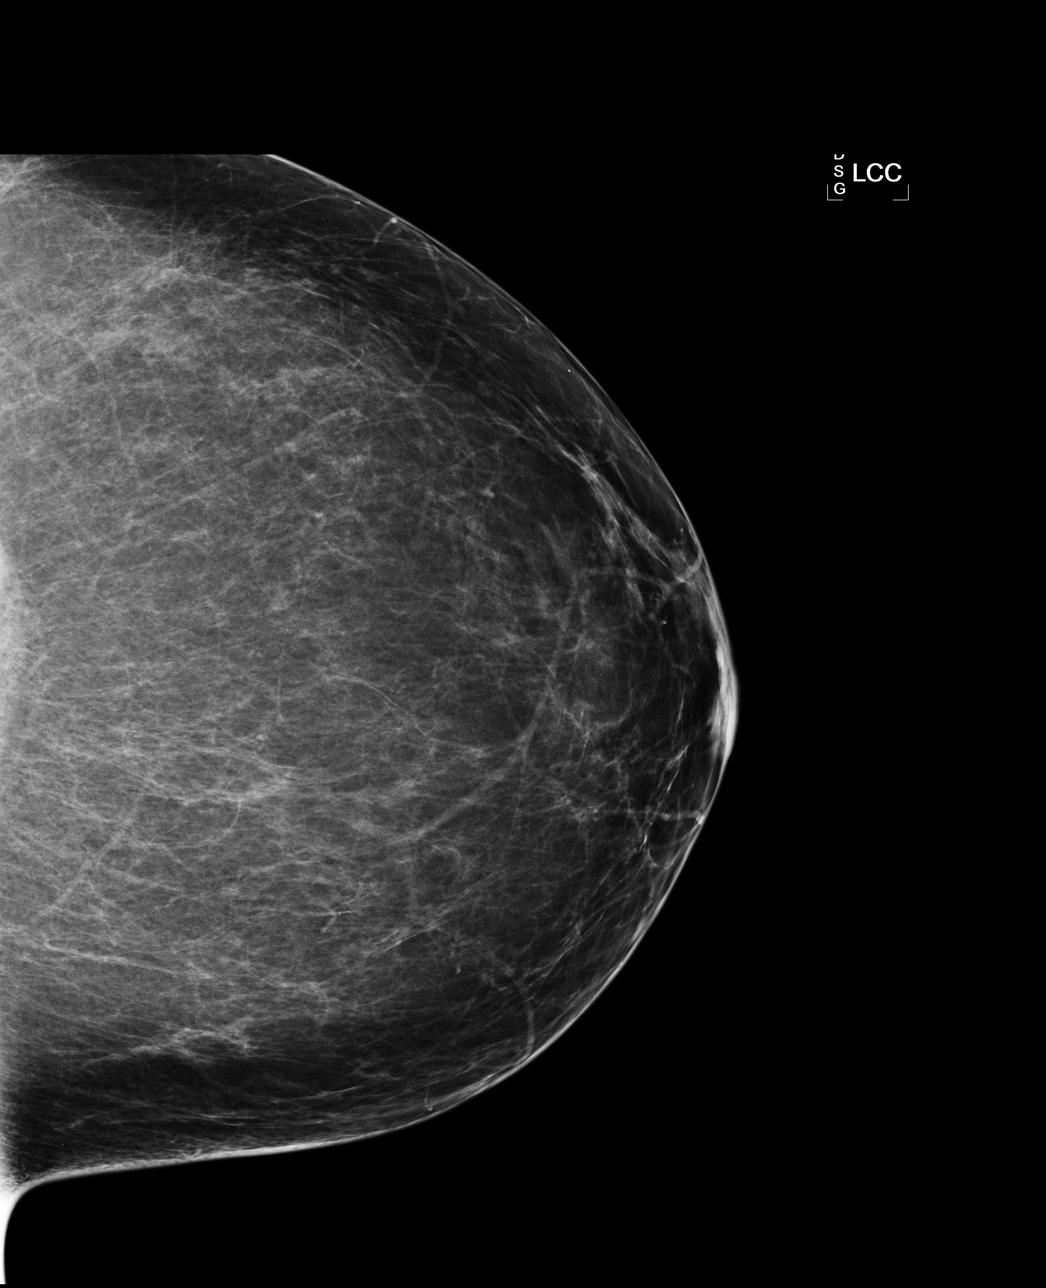

[L MLO]
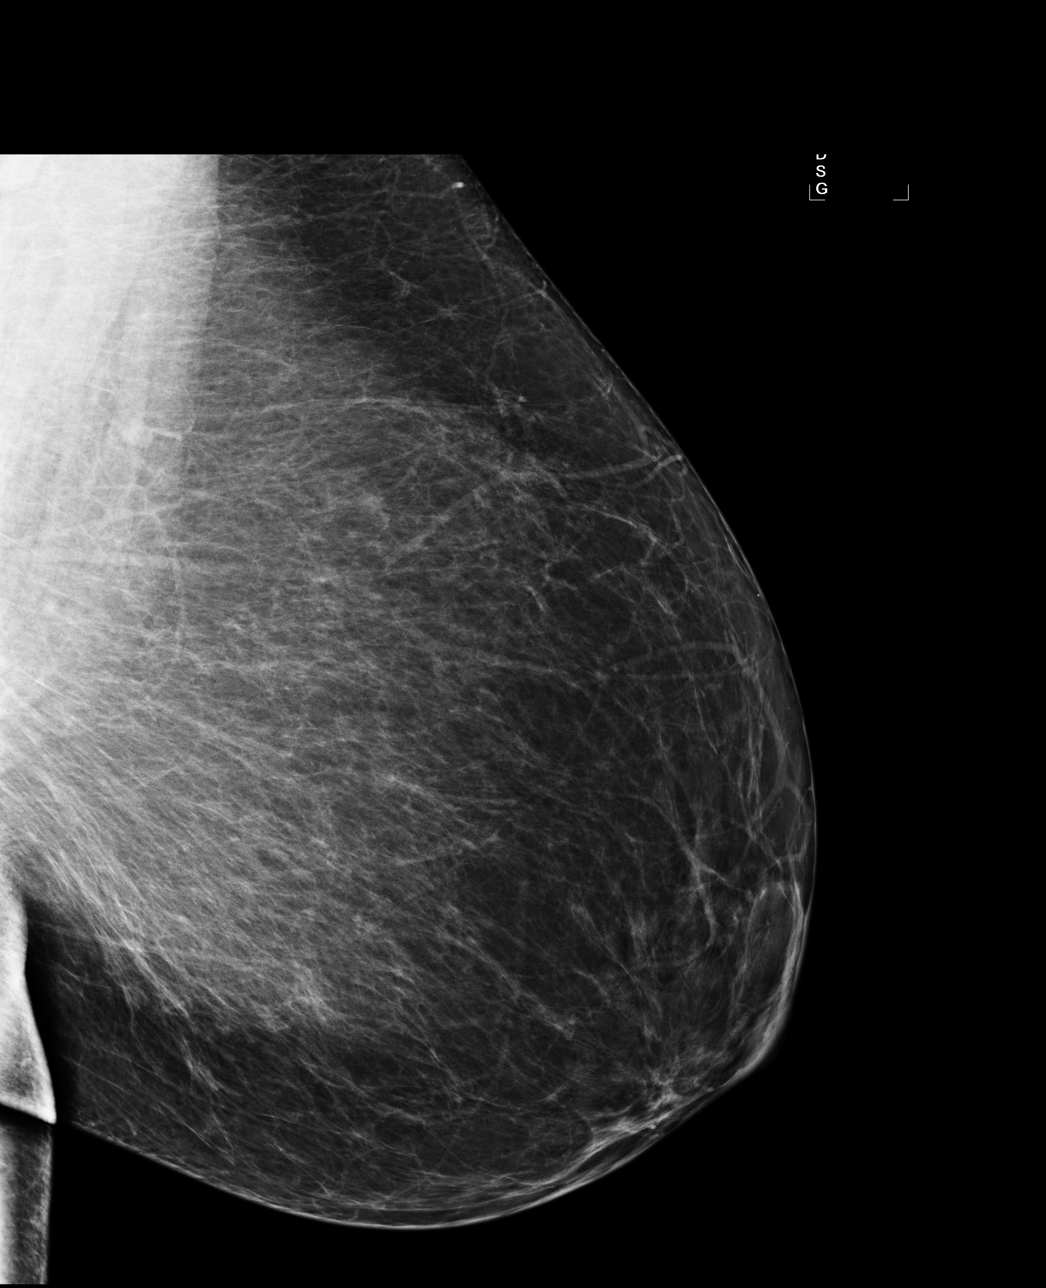

[4 of 4 positions shown; findings below may reference images not displayed]

ACR Breast Density Category b: There are scattered areas of
fibroglandular density.
FINDINGS: There are no findings suspicious for malignancy. Images were
processed with CAD.
IMPRESSION: No mammographic evidence of malignancy. A result letter of this
screening mammogram will be mailed directly to the patient.

RECOMMENDATION:
Screening mammogram in one year. (Code:AS-G-LCT)

BI-RADS CATEGORY  1: Negative.

## 2015-09-19 ENCOUNTER — Encounter: Payer: Self-pay | Admitting: Gynecology

## 2015-09-19 ENCOUNTER — Ambulatory Visit (INDEPENDENT_AMBULATORY_CARE_PROVIDER_SITE_OTHER): Payer: 59 | Admitting: Gynecology

## 2015-09-19 VITALS — BP 134/86

## 2015-09-19 DIAGNOSIS — L304 Erythema intertrigo: Secondary | ICD-10-CM

## 2015-09-19 DIAGNOSIS — N898 Other specified noninflammatory disorders of vagina: Secondary | ICD-10-CM

## 2015-09-19 DIAGNOSIS — A499 Bacterial infection, unspecified: Secondary | ICD-10-CM | POA: Diagnosis not present

## 2015-09-19 DIAGNOSIS — N760 Acute vaginitis: Secondary | ICD-10-CM

## 2015-09-19 DIAGNOSIS — R350 Frequency of micturition: Secondary | ICD-10-CM

## 2015-09-19 DIAGNOSIS — B9689 Other specified bacterial agents as the cause of diseases classified elsewhere: Secondary | ICD-10-CM

## 2015-09-19 LAB — URINALYSIS W MICROSCOPIC + REFLEX CULTURE
Bilirubin Urine: NEGATIVE
Casts: NONE SEEN [LPF]
Crystals: NONE SEEN [HPF]
Glucose, UA: NEGATIVE
Ketones, ur: NEGATIVE
Leukocytes, UA: NEGATIVE
Nitrite: NEGATIVE
Protein, ur: NEGATIVE
Specific Gravity, Urine: 1.01 (ref 1.001–1.035)
Yeast: NONE SEEN [HPF]
pH: 7 (ref 5.0–8.0)

## 2015-09-19 LAB — WET PREP FOR TRICH, YEAST, CLUE
Clue Cells Wet Prep HPF POC: NONE SEEN
Trich, Wet Prep: NONE SEEN
Yeast Wet Prep HPF POC: NONE SEEN

## 2015-09-19 MED ORDER — METRONIDAZOLE 500 MG PO TABS: 500.0000 mg | ORAL_TABLET | Freq: Two times a day (BID) | ORAL | Status: DC

## 2015-09-19 MED ORDER — NYSTATIN-TRIAMCINOLONE 100000-0.1 UNIT/GM-% EX CREA: 1.0000 "application " | TOPICAL_CREAM | Freq: Three times a day (TID) | CUTANEOUS | Status: DC

## 2015-09-19 MED ORDER — PHENAZOPYRIDINE HCL 97.2 MG PO TABS: 97.0000 mg | ORAL_TABLET | Freq: Three times a day (TID) | ORAL | Status: DC | PRN

## 2015-09-19 NOTE — Patient Instructions (Signed)
Phenazopyridine tablets  What is this medicine?  PHENAZOPYRIDINE (fen az oh PEER i deen) is a pain reliever. It is used to stop the pain, burning, or discomfort caused by infection or irritation of the urinary tract. This medicine is not an antibiotic. It will not cure a urinary tract infection.  This medicine may be used for other purposes; ask your health care provider or pharmacist if you have questions.  What should I tell my health care provider before I take this medicine?  They need to know if you have any of these conditions:  -glucose-6-phosphate dehydrogenase (G6PD) deficiency  -kidney disease  -an unusual or allergic reaction to phenazopyridine, other medicines, foods, dyes, or preservatives  -pregnant or trying to get pregnant  -breast-feeding  How should I use this medicine?  Take this medicine by mouth with a glass of water. Follow the directions on the prescription label. Take after meals. Take your doses at regular intervals. Do not take your medicine more often than directed. Do not skip doses or stop your medicine early even if you feel better. Do not stop taking except on your doctor's advice.  Talk to your pediatrician regarding the use of this medicine in children. Special care may be needed.  Overdosage: If you think you have taken too much of this medicine contact a poison control center or emergency room at once.  NOTE: This medicine is only for you. Do not share this medicine with others.  What if I miss a dose?  If you miss a dose, take it as soon as you can. If it is almost time for your next dose, take only that dose. Do not take double or extra doses.  What may interact with this medicine?  Interactions are not expected.  This list may not describe all possible interactions. Give your health care provider a list of all the medicines, herbs, non-prescription drugs, or dietary supplements you use. Also tell them if you smoke, drink alcohol, or use illegal drugs. Some items may interact  with your medicine.  What should I watch for while using this medicine?  Tell your doctor or health care professional if your symptoms do not improve or if they get worse.  This medicine colors body fluids red. This effect is harmless and will go away after you are done taking the medicine. It will change urine to an dark orange or red color. The red color may stain clothing. Soft contact lenses may become permanently stained. It is best not to wear soft contact lenses while taking this medicine.  If you are diabetic you may get a false positive result for sugar in your urine. Talk to your health care provider.  What side effects may I notice from receiving this medicine?  Side effects that you should report to your doctor or health care professional as soon as possible:  -allergic reactions like skin rash, itching or hives, swelling of the face, lips, or tongue  -blue or purple color of the skin  -difficulty breathing  -fever  -less urine  -unusual bleeding, bruising  -unusual tired, weak  -vomiting  -yellowing of the eyes or skin  Side effects that usually do not require medical attention (report to your doctor or health care professional if they continue or are bothersome):  -dark urine  -headache  -stomach upset  This list may not describe all possible side effects. Call your doctor for medical advice about side effects. You may report side effects to FDA at 1-800-FDA-1088.    expiration date. NOTE: This sheet is a summary. It may not cover all possible information. If you have questions about this medicine, talk to your doctor, pharmacist, or health care provider.    2016, Elsevier/Gold Standard. (2007-11-24 11:04:07) Metronidazole injection What is this medicine? METRONIDAZOLE (me troe NI  da zole) is an antiinfective. It medicine is used to treat or prevent certain kinds of bacterial and protozoal infections. It will not work for colds, flu, or other viral infections. This medicine may be used for other purposes; ask your health care provider or pharmacist if you have questions. What should I tell my health care provider before I take this medicine? They need to know if you have any of these conditions: -anemia or other blood disorders -disease of the nervous system -fungal or yeast infection -history of edema, swelling -if you drink alcohol containing drinks -liver disease -seizures -an unusual or allergic reaction to metronidazole, or other medicines, foods, dyes, or preservatives -pregnant or trying to get pregnant -breast-feeding How should I use this medicine? This medicine is for infusion into a vein. It is usually given by a health care professional in a hospital or clinic setting. If you get this medicine at home, you will be taught how to prepare and give this medicine. Use exactly as directed. Take your medicine at regular intervals. Do not take your medicine more often than directed. It is important that you put your used needles and syringes in a special sharps container. Do not put them in a trash can. If you do not have a sharps container, call your pharmacist or healthcare provider to get one. Talk to your pediatrician regarding the use of this medicine in children. Special care may be needed. Overdosage: If you think you have taken too much of this medicine contact a poison control center or emergency room at once. NOTE: This medicine is only for you. Do not share this medicine with others. What if I miss a dose? If you miss a dose, take it as soon as you can. If it is almost time for your next dose, take only that dose. Do not take double or extra doses. What may interact with this medicine? Do not take this medicine with any of the following  medications: -alcohol or any product that contains alcohol -amprenavir oral solution -cisapride -disulfiram -dofetilide -dronedarone -paclitaxel injection -pimozide -ritonavir oral solution -sertraline oral solution -sulfamethoxazole-trimethoprim injection -thioridazine -ziprasidone This medicine may also interact with the following medications: -birth control pills -cimetidine -lithium -other medicines that prolong the QT interval (cause an abnormal heart rhythm) -phenobarbital -phenytoin -warfarin This list may not describe all possible interactions. Give your health care provider a list of all the medicines, herbs, non-prescription drugs, or dietary supplements you use. Also tell them if you smoke, drink alcohol, or use illegal drugs. Some items may interact with your medicine. What should I watch for while using this medicine? Tell your doctor or health care professional if your symptoms do not improve or if they get worse. You may get drowsy or dizzy. Do not drive, use machinery, or do anything that needs mental alertness until you know how this medicine affects you. Do not stand or sit up quickly, especially if you are an older patient. This reduces the risk of dizzy or fainting spells. Avoid alcoholic drinks while you are taking this medicine and for three days afterward. Alcohol may make you feel dizzy, sick, or flushed. If you are being treated for a sexually transmitted disease, avoid  sexual contact until you have finished your treatment. Your sexual partner may also need treatment. What side effects may I notice from receiving this medicine? Side effects that you should report to your doctor or health care professional as soon as possible: -allergic reactions like skin rash or hives, swelling of the face, lips, or tongue -confusion, clumsiness -difficulty speaking -discolored or sore mouth -dizziness -fever, infection -numbness, tingling, pain or weakness -trouble  passing urine or change in the amount of urine -redness, blistering, peeling or loosening of the skin, including inside the mouth -seizures -unusual bleeding, bruising -unusually weak or tired -vaginal irritation, dryness, or discharge Side effects that usually do not require medical attention (report to your doctor or health care professional if they continue or are bothersome): -dark urine -diarrhea -headache -irritability -metallic taste -nausea -pain, irritation where injected -stomach pain, cramps -trouble sleeping This list may not describe all possible side effects. Call your doctor for medical advice about side effects. You may report side effects to FDA at 1-800-FDA-1088. Where should I keep my medicine? Keep out of the reach of children. If you are using this medicine at home, you will be instructed on how to store this medicine. Throw away any unused medicine after the expiration date on the label. NOTE: This sheet is a summary. It may not cover all possible information. If you have questions about this medicine, talk to your doctor, pharmacist, or health care provider.    2016, Elsevier/Gold Standard. (2012-12-02 14:06:56) Intertrigo Intertrigo is a skin condition that occurs in between folds of skin in places on the body that rub together a lot and do not get much ventilation. It is caused by heat, moisture, friction, sweat retention, and lack of air circulation, which produces red, irritated patches and, sometimes, scaling or drainage. People who have diabetes, who are obese, or who have treatment with antibiotics are at increased risk for intertrigo. The most common sites for intertrigo to occur include:  The groin.  The breasts.  The armpits.  Folds of abdominal skin.  Webbed spaces between the fingers or toes. Intertrigo may be aggravated by:  Sweat.  Feces.  Yeast or bacteria that are present near skin folds.  Urine.  Vaginal discharge. HOME CARE  INSTRUCTIONS  The following steps can be taken to reduce friction and keep the affected area cool and dry:  Expose skin folds to the air.  Keep deep skin folds separated with cotton or linen cloth. Avoid tight fitting clothing that could cause chafing.  Wear open-toed shoes or sandals to help reduce moisture between the toes.  Apply absorbent powders to affected areas as directed by your caregiver.  Apply over-the-counter barrier pastes, such as zinc oxide, as directed by your caregiver.  If you develop a fungal infection in the affected area, your caregiver may have you use antifungal creams. SEEK MEDICAL CARE IF:   The rash is not improving after 1 week of treatment.  The rash is getting worse (more red, more swollen, more painful, or spreading).  You have a fever or chills. MAKE SURE YOU:   Understand these instructions.  Will watch your condition.  Will get help right away if you are not doing well or get worse.   This information is not intended to replace advice given to you by your health care provider. Make sure you discuss any questions you have with your health care provider.   Document Released: 04/27/2005 Document Revised: 07/20/2011 Document Reviewed: 10/29/2014 Elsevier Interactive Patient Education 2016  Elsevier Inc. Bacterial Vaginosis Bacterial vaginosis is a vaginal infection that occurs when the normal balance of bacteria in the vagina is disrupted. It results from an overgrowth of certain bacteria. This is the most common vaginal infection in women of childbearing age. Treatment is important to prevent complications, especially in pregnant women, as it can cause a premature delivery. CAUSES  Bacterial vaginosis is caused by an increase in harmful bacteria that are normally present in smaller amounts in the vagina. Several different kinds of bacteria can cause bacterial vaginosis. However, the reason that the condition develops is not fully understood. RISK  FACTORS Certain activities or behaviors can put you at an increased risk of developing bacterial vaginosis, including:  Having a new sex partner or multiple sex partners.  Douching.  Using an intrauterine device (IUD) for contraception. Women do not get bacterial vaginosis from toilet seats, bedding, swimming pools, or contact with objects around them. SIGNS AND SYMPTOMS  Some women with bacterial vaginosis have no signs or symptoms. Common symptoms include:  Grey vaginal discharge.  A fishlike odor with discharge, especially after sexual intercourse.  Itching or burning of the vagina and vulva.  Burning or pain with urination. DIAGNOSIS  Your health care provider will take a medical history and examine the vagina for signs of bacterial vaginosis. A sample of vaginal fluid may be taken. Your health care provider will look at this sample under a microscope to check for bacteria and abnormal cells. A vaginal pH test may also be done.  TREATMENT  Bacterial vaginosis may be treated with antibiotic medicines. These may be given in the form of a pill or a vaginal cream. A second round of antibiotics may be prescribed if the condition comes back after treatment. Because bacterial vaginosis increases your risk for sexually transmitted diseases, getting treated can help reduce your risk for chlamydia, gonorrhea, HIV, and herpes. HOME CARE INSTRUCTIONS   Only take over-the-counter or prescription medicines as directed by your health care provider.  If antibiotic medicine was prescribed, take it as directed. Make sure you finish it even if you start to feel better.  Tell all sexual partners that you have a vaginal infection. They should see their health care provider and be treated if they have problems, such as a mild rash or itching.  During treatment, it is important that you follow these instructions:  Avoid sexual activity or use condoms correctly.  Do not douche.  Avoid alcohol as  directed by your health care provider.  Avoid breastfeeding as directed by your health care provider. SEEK MEDICAL CARE IF:   Your symptoms are not improving after 3 days of treatment.  You have increased discharge or pain.  You have a fever. MAKE SURE YOU:   Understand these instructions.  Will watch your condition.  Will get help right away if you are not doing well or get worse. FOR MORE INFORMATION  Centers for Disease Control and Prevention, Division of STD Prevention: [employer] American Sexual Health Association (ASHA): www.ashastd.org    This information is not intended to replace advice given to you by your health care provider. Make sure you discuss any questions you have with your health care provider.   Document Released: 04/27/2005 Document Revised: 05/18/2014 Document Reviewed: 12/07/2012 Elsevier Interactive Patient Education [employer].

## 2015-09-19 NOTE — Progress Notes (Signed)
   HPI: Is a 55 year old that presented to the office today complaining of low abdominal discomfort vulvar irritation and some frequency but no dysuria. She denied any back pain, fever, chills, nausea, or any vomiting. She was also complaining irritation and pruritus underneath her left axillary region. Patient has not been seen in the office since February of last year which she had her annual gynecological examination.Patient with known history of fibroid uterus as well as a right hydrosalpinx. Patient is menopausal on no hormone replacement therapy very minimal vasomotor symptoms.   ROS: A ROS was performed and pertinent positives and negatives are included in the history.  GENERAL: No fevers or chills. HEENT: No change in vision, no earache, sore throat or sinus congestion. NECK: No pain or stiffness. CARDIOVASCULAR: No chest pain or pressure. No palpitations. PULMONARY: No shortness of breath, cough or wheeze. GASTROINTESTINAL: No abdominal pain, nausea, vomiting or diarrhea, melena or bright red blood per rectum. GENITOURINARY: Urinary frequency but no, urgency, hesitancy or dysuria. MUSCULOSKELETAL: No joint or muscle pain, no back pain, no recent trauma. DERMATOLOGIC: No rash, no itching, no lesions. ENDOCRINE: No polyuria, polydipsia, no heat or cold intolerance. No recent change in weight. HEMATOLOGICAL: No anemia or easy bruising or bleeding. NEUROLOGIC: No headache, seizures, numbness, tingling or weakness. PSYCHIATRIC: No depression, no loss of interest in normal activity or change in sleep pattern.   PE: Blood pressure 134/86 Well-developed well-nourished female with above-mentioned complaining no acute distress Breasts: Both breasts were examined in there were symmetrical in appearance no skin discoloration or nipple inversion no palpable masses or tenderness no supra clavicular axillary lymphadenopathy Back: No CVA tenderness Abdomen: Soft nontender no rebound minimal tenderness suprapubic  but no guarding Pelvic: Bartholin urethra Skene was within normal limits Vagina: No lesions or discharge Uterus: Anteverted upper limits of normal slight suprapubic tenderness Adnexa: No palpable masses or tenderness Rectal exam not done  Urinalysis: 0-5 WBC, 0-2 RBC, moderate bacteria  Wet prep few WBC, few bacteria   Assessment Plan: Patient with a multitude of antibiotic allergies were going to wait for urine culture sensitivity to confirm indeed a urinary tract infection. We are going to treat her in the event of a mild bacterial vaginosis with Flagyl 500 mg one by mouth twice a day for 7 days. Patient skin irritation underneath her armpit will be treated with mytrex cream to apply twice a day for 7-10 days. I've encouraged also to change her deodorant as well. She'll be prescribed Pyridium 200 mg one by mouth 3 times a day for 3 days as will a for the urine culture. She was encouraged to increase her fluid intake. She is also to schedule her overdue annual exam.    Greater than 50% of time was spent in counseling and coordinating care of this patient.   Time of consultation: 15   Minutes.

## 2015-09-21 LAB — URINE CULTURE
Colony Count: NO GROWTH
Organism ID, Bacteria: NO GROWTH

## 2015-10-23 ENCOUNTER — Encounter: Payer: 59 | Admitting: Gynecology

## 2015-10-29 ENCOUNTER — Other Ambulatory Visit: Payer: Self-pay | Admitting: Gynecology

## 2015-10-29 DIAGNOSIS — Z1231 Encounter for screening mammogram for malignant neoplasm of breast: Secondary | ICD-10-CM

## 2015-11-07 ENCOUNTER — Ambulatory Visit
Admission: RE | Admit: 2015-11-07 | Discharge: 2015-11-07 | Disposition: A | Discharge status: Home / Self Care | Payer: 59 | Source: Ambulatory Visit | Attending: Gynecology | Admitting: Gynecology

## 2015-11-07 DIAGNOSIS — Z1231 Encounter for screening mammogram for malignant neoplasm of breast: Secondary | ICD-10-CM

## 2015-11-26 ENCOUNTER — Encounter: Payer: Self-pay | Admitting: Gynecology

## 2015-11-26 ENCOUNTER — Ambulatory Visit (INDEPENDENT_AMBULATORY_CARE_PROVIDER_SITE_OTHER): Payer: 59 | Admitting: Gynecology

## 2015-11-26 VITALS — BP 150/90 | Ht 64.0 in | Wt 216.0 lb

## 2015-11-26 DIAGNOSIS — Z79890 Hormone replacement therapy (postmenopausal): Secondary | ICD-10-CM

## 2015-11-26 DIAGNOSIS — I10 Essential (primary) hypertension: Secondary | ICD-10-CM | POA: Diagnosis not present

## 2015-11-26 DIAGNOSIS — D251 Intramural leiomyoma of uterus: Secondary | ICD-10-CM

## 2015-11-26 DIAGNOSIS — Z01419 Encounter for gynecological examination (general) (routine) without abnormal findings: Secondary | ICD-10-CM

## 2015-11-26 DIAGNOSIS — N952 Postmenopausal atrophic vaginitis: Secondary | ICD-10-CM | POA: Diagnosis not present

## 2015-11-26 MED ORDER — ESTRADIOL 10 MCG VA TABS: 10.0000 ug | ORAL_TABLET | VAGINAL | Status: DC

## 2015-11-26 MED ORDER — HYDROCHLOROTHIAZIDE 12.5 MG PO CAPS
12.5000 mg | ORAL_CAPSULE | Freq: Every day | ORAL | Status: DC
Start: 2015-11-26 — End: 2018-01-17

## 2015-11-26 NOTE — Patient Instructions (Addendum)
Estradiol vaginal tablets What is this medicine? ESTRADIOL (es tra DYE ole) vaginal tablet is used to help relieve symptoms of vaginal irritation and dryness that occurs in some women during menopause. This medicine may be used for other purposes; ask your health care provider or pharmacist if you have questions. What should I tell my health care provider before I take this medicine? They need to know if you have any of these conditions: -abnormal vaginal bleeding -blood vessel disease or blood clots -breast, cervical, endometrial, ovarian, liver, or uterine cancer -dementia -diabetes -gallbladder disease -heart disease or recent heart attack -high blood pressure -high cholesterol -high level of calcium in the blood -hysterectomy -kidney disease -liver disease -migraine headaches -protein C deficiency -protein S deficiency -stroke -systemic lupus erythematosus (SLE) -tobacco smoker -an unusual or allergic reaction to estrogens, other hormones, medicines, foods, dyes, or preservatives -pregnant or trying to get pregnant -breast-feeding How should I use this medicine? This medicine is only for use in the vagina. Do not take by mouth. Wash and dry your hands before and after use. Read package directions carefully. Unwrap the applicator package. Be sure to use a new applicator for each dose. Use at the same time each day. If the tablet has fallen out of the applicator, but is still in the package, carefully place it back into the applicator. If the tablet has fallen out of the package, that applicator should be thrown out and you should use a new applicator containing a new tablet. Lie on your back, part and bend your knees. Gently insert the applicator as far as comfortably possible into the vagina. Then, gently press the plunger until the plunger is fully depressed. This will release the tablet into the vagina. Gently remove the applicator. Throw away the applicator after use. Do not use  your medicine more often than directed. Do not stop using except on the advice of your doctor or health care professional. Talk to your pediatrician regarding the use of this medicine in children. This medicine is not approved for use in children. A patient package insert for the product will be given with each prescription and refill. Read this sheet carefully each time. The sheet may change frequently. Overdosage: If you think you have taken too much of this medicine contact a poison control center or emergency room at once. NOTE: This medicine is only for you. Do not share this medicine with others. What if I miss a dose? If you miss a dose, take it as soon as you can. If it is almost time for your next dose, take only that dose. Do not take double or extra doses. What may interact with this medicine? Do not take this medicine with any of the following medications: -aromatase inhibitors like aminoglutethimide, anastrozole, exemestane, letrozole, testolactone This medicine may also interact with the following medications: -antibiotics used to treat tuberculosis like rifabutin, rifampin and rifapentene -raloxifene or tamoxifen -warfarin This list may not describe all possible interactions. Give your health care provider a list of all the medicines, herbs, non-prescription drugs, or dietary supplements you use. Also tell them if you smoke, drink alcohol, or use illegal drugs. Some items may interact with your medicine. What should I watch for while using this medicine? Visit your health care professional for regular checks on your progress. You will need a regular breast and pelvic exam. You should also discuss the need for regular mammograms with your health care professional, and follow his or her guidelines. This medicine can make  your body retain fluid, making your fingers, hands, or ankles swell. Your blood pressure can go up. Contact your doctor or health care professional if you feel you are  retaining fluid. If you have any reason to think you are pregnant; stop taking this medicine at once and contact your doctor or health care professional. Tobacco smoking increases the risk of getting a blood clot or having a stroke, especially if you are more than 56 years old. You are strongly advised not to smoke. If you wear contact lenses and notice visual changes, or if the lenses begin to feel uncomfortable, consult your eye care specialist. If you are going to have elective surgery, you may need to stop taking this medicine beforehand. Consult your health care professional for advice prior to scheduling the surgery. What side effects may I notice from receiving this medicine? Side effects that you should report to your doctor or health care professional as soon as possible: -allergic reactions like skin rash, itching or hives, swelling of the face, lips, or tongue -breast tissue changes or discharge -changes in vision -chest pain -confusion, trouble speaking or understanding -dark urine -general ill feeling or flu-like symptoms -light-colored stools -nausea, vomiting -pain, swelling, warmth in the leg -right upper belly pain -severe headaches -shortness of breath -sudden numbness or weakness of the face, arm or leg -trouble walking, dizziness, loss of balance or coordination -unusual vaginal bleeding -yellowing of the eyes or skin Side effects that usually do not require medical attention (report to your doctor or health care professional if they continue or are bothersome): -hair loss -increased hunger or thirst -increased urination -symptoms of vaginal infection like itching, irritation or unusual discharge -unusually weak or tired This list may not describe all possible side effects. Call your doctor for medical advice about side effects. You may report side effects to FDA at 1-800-FDA-1088. Where should I keep my medicine? Keep out of the reach of children. Store at room  temperature between 15 and 30 degrees C (59 and 86 degrees F). Throw away any unused medicine after the expiration date. NOTE: This sheet is a summary. It may not cover all possible information. If you have questions about this medicine, talk to your doctor, pharmacist, or health care provider.    2016, Elsevier/Gold Standard. (2014-04-11 09:22:51)

## 2015-11-26 NOTE — Progress Notes (Signed)
Betty Bell 1961/02/03 [id number]   History:    55 y.o.  for annual gyn exam and was found to have elevated blood pressure 150/90. Her PCP is Dr. Ronnald Ramp. Patient the past had been on Bystolic 5 mg daily for her hypertension but has been off of it for a year. She states he occasions having some headaches and needs to follow-up with him. Patient also having issues with vaginal dryness and discomfort during intercourse. She reports no vaginal bleeding. Patient has not been on any hormone replacement therapy. She had a colonoscopy in 2015 and benign polyps were removed. Patient's mother and brother and sister had history of hypertension. Patient also with known history of fibroid uterus as well as a right hydrosalpinx. Patient's father and brother have history of colon cancer.  Patient's past GYN history as follows: In 2005 patient had colposcopy directed biopsy which demonstrated CIN-1 and CIN-2 and underwent a CO2 laser ablation of cervix. Followup Pap smear December 2011 low-grade squamous intraepithelial lesion no high-risk HPV detected. She underwent colposcopic biopsy in January 2012 demonstrated once again CIN-1. Pap smears within normal.  2009 as a result of the patient's history of dysfunctional uterine bleeding and endometrial polyps she underwent hysteroscopy D&C pathology report confirmed benign endometrial polyps with no evidence of hyperplasia or malignancy.  1995 patient had a D&C for first trimester missed abortion  Patient had a normal Pap smear in 2016.  Past medical history,surgical history, family history and social history were all reviewed and documented in the EPIC chart.  Gynecologic History Patient's last menstrual period was 03/26/2011. Contraception: post menopausal status Last Pap: 2016. Results were: normal Last mammogram: 2017. Results were: normal  Obstetric History OB History  Gravida Para Term Preterm AB SAB TAB Ectopic Multiple Living  1    1         #  Outcome Date GA Lbr Len/2nd Weight Sex Delivery Anes PTL Lv  1 AB                ROS: A ROS was performed and pertinent positives and negatives are included in the history.  GENERAL: No fevers or chills. HEENT: No change in vision, no earache, sore throat or sinus congestion. NECK: No pain or stiffness. CARDIOVASCULAR: No chest pain or pressure. No palpitations. PULMONARY: No shortness of breath, cough or wheeze. GASTROINTESTINAL: No abdominal pain, nausea, vomiting or diarrhea, melena or bright red blood per rectum. GENITOURINARY: No urinary frequency, urgency, hesitancy or dysuria. MUSCULOSKELETAL: No joint or muscle pain, no back pain, no recent trauma. DERMATOLOGIC: No rash, no itching, no lesions. ENDOCRINE: No polyuria, polydipsia, no heat or cold intolerance. No recent change in weight. HEMATOLOGICAL: No anemia or easy bruising or bleeding. NEUROLOGIC: No headache, seizures, numbness, tingling or weakness. PSYCHIATRIC: No depression, no loss of interest in normal activity or change in sleep pattern.     Exam: chaperone present  BP 150/90 mmHg  Ht 5\' 4"  (1.626 m)  Wt 216 lb (97.977 kg)  BMI 37.06 kg/m2  LMP 03/26/2011  Body mass index is 37.06 kg/(m^2).  General appearance : Well developed well nourished female. No acute distress HEENT: Eyes: no retinal hemorrhage or exudates,  Neck supple, trachea midline, no carotid bruits, no thyroidmegaly Lungs: Clear to auscultation, no rhonchi or wheezes, or rib retractions  Heart: Regular rate and rhythm, no murmurs or gallops Breast:Examined in sitting and supine position were symmetrical in appearance, no palpable masses or tenderness,  no skin retraction, no nipple inversion,  no nipple discharge, no skin discoloration, no axillary or supraclavicular lymphadenopathy Abdomen: no palpable masses or tenderness, no rebound or guarding Extremities: no edema or skin discoloration or tenderness  Pelvic:  Bartholin, Urethra, Skene Glands: Within  normal limits             Vagina: No gross lesions or discharge, atrophic changes  Cervix: No gross lesions or discharge  Uterus  anteverted, normal size, shape and consistency, non-tender and mobile  Adnexa  Without masses or tenderness  Anus and perineum  normal   Rectovaginal  normal sphincter tone without palpated masses or tenderness             Hemoccult cards will be provided     Assessment/Plan:  55 y.o. female for annual exam with history of essential hypertension and has been off blood pressure medication for over a year. She slightly symptomatic with headaches. I'm going to start her on HCTZ 12.5 mg daily and recommended a low salt diet. I have also recommended she follow-up with her internist at the other week or the beginning of next week to check on her blood pressure. Pap smear with HPV screening was done today. She was provided with fecal Hemoccult cards to submit to the office for testing. We discussed importance of monthly breast exams. We discussed importance of calcium vitamin D and weightbearing exercise for osteoporosis prevention. Sometime next year patient will need to have a baseline bone density study. For vaginal atrophy and dryness and irritation she'll be prescribed Vagifem 10 g to apply intravaginally twice a week. Risk benefits and pros and cons were discussed and literature information was provided. All questions were answered.   Terrance Mass MD, 5:53 PM 11/26/2015

## 2015-11-28 LAB — PAP, TP IMAGING W/ HPV RNA, RFLX HPV TYPE 16,18/45: HPV mRNA, High Risk: NOT DETECTED

## 2016-05-14 ENCOUNTER — Ambulatory Visit (INDEPENDENT_AMBULATORY_CARE_PROVIDER_SITE_OTHER): Payer: 59 | Admitting: Internal Medicine

## 2016-05-14 ENCOUNTER — Encounter: Payer: Self-pay | Admitting: Internal Medicine

## 2016-05-14 VITALS — BP 128/78 | HR 76 | Temp 98.8°F | Resp 20 | Wt 218.0 lb

## 2016-05-14 DIAGNOSIS — I10 Essential (primary) hypertension: Secondary | ICD-10-CM

## 2016-05-14 DIAGNOSIS — R05 Cough: Secondary | ICD-10-CM

## 2016-05-14 DIAGNOSIS — R059 Cough, unspecified: Secondary | ICD-10-CM

## 2016-05-14 MED ORDER — HYDROCODONE-HOMATROPINE 5-1.5 MG/5ML PO SYRP: 5.0000 mL | ORAL_SOLUTION | Freq: Four times a day (QID) | ORAL | 0 refills | Status: DC | PRN

## 2016-05-14 MED ORDER — AZITHROMYCIN 250 MG PO TABS
ORAL_TABLET | ORAL | 1 refills | Status: DC
Start: 2016-05-14 — End: 2016-10-14

## 2016-05-14 NOTE — Progress Notes (Signed)
Pre visit review using our clinic review tool, if applicable. No additional management support is needed unless otherwise documented below in the visit note. 

## 2016-05-14 NOTE — Progress Notes (Signed)
Subjective:    Patient ID: Betty Bell, female    DOB: 06-27-1960, 56 y.o.   MRN: [id number]  HPI  Here with acute onset mild to mod 2-3 days ST, HA, general weakness and malaise, with prod cough greenish sputum, but Pt denies chest pain, increased sob or doe, wheezing, orthopnea, PND, increased LE swelling, palpitations, dizziness or syncope. Pt denies new neurological symptoms such as new headache, or facial or extremity weakness or numbness   Pt denies polydipsia, polyuria, Past Medical History:  Diagnosis Date  . Anxiety   . Asthma   . Cervical dysplasia    High Grade  . CIN I (cervical intraepithelial neoplasia I)   . Colon polyp 2015   TUBULAR ADENOMA  . Endometrial polyp   . GERD (gastroesophageal reflux disease)   . Hydrosalpinx    Left and Right  . IBS (irritable bowel syndrome)   . Rectal burning   . Seasonal allergies   . Uterine fibroid    Past Surgical History:  Procedure Laterality Date  . CARPAL TUNNEL RELEASE Left    x 2  . COLONOSCOPY  2015  . DILATION AND CURETTAGE OF UTERUS    . HYSTEROSCOPY    . LASER ABLATION OF THE CERVIX    . PELVIC LAPAROSCOPY     DL  . UTERINE FIBROID EMBOLIZATION    . wrist cystectomy Right     reports that she has never smoked. She has never used smokeless tobacco. She reports that she does not drink alcohol or use drugs. family history includes Cancer in her brother; Cancer - Other in her brother; Colon cancer (age of onset: 79) in her brother; Colon cancer (age of onset: 56) in her father; Colon polyps in her sister; Diabetes in her father and mother; Hypertension in her father, mother, and sister; Irritable bowel syndrome in her sister; Lung cancer in her brother; Prostate cancer in her father. Allergies  Allergen Reactions  . Aspirin     REACTION: Swelling  . Cephalosporins     Stomach cramps  . Ciprofloxacin     Stomach cramps  . Fish Allergy     Shortness of breath  . Nitrofurantoin Monohyd Macro     Stomach  cramps  . Penicillins     Per pt: unknown  . Protonix [Pantoprazole Sodium]     Respiratory distress  . Sulfa Antibiotics     Per pt: unknown   Current Outpatient Prescriptions on File Prior to Visit  Medication Sig Dispense Refill  . aspirin 81 MG tablet Take 81 mg by mouth once.    . Estradiol 10 MCG TABS vaginal tablet Place 1 tablet (10 mcg total) vaginally 2 (two) times a week. 8 tablet 11  . hydrochlorothiazide (MICROZIDE) 12.5 MG capsule Take 1 capsule (12.5 mg total) by mouth daily. Take one every morning 30 capsule 11  . metroNIDAZOLE (FLAGYL) 500 MG tablet Take 1 tablet (500 mg total) by mouth 2 (two) times daily. 14 tablet 0  . nebivolol (BYSTOLIC) 5 MG tablet Take 1 tablet (5 mg total) by mouth daily. 42 tablet 0  . nystatin-triamcinolone (MYCOLOG II) cream Apply 1 application topically 3 (three) times daily. 30 g 2  . phenazopyridine (PYRIDIUM) 97 MG tablet Take 1 tablet (97 mg total) by mouth 3 (three) times daily as needed for pain. 10 tablet 0  . ranitidine (ZANTAC) 150 MG tablet Take 1 tablet (150 mg total) by mouth 2 (two) times daily. 300mg  in the morning and 150mg   in the evening. 270 tablet 3   No current facility-administered medications on file prior to visit.    Review of Systems All otherwise neg per pt     Objective:   Physical Exam BP 128/78   Pulse 76   Temp 98.8 F (37.1 C) (Oral)   Resp 20   Wt 218 lb (98.9 kg)   LMP 03/26/2011   SpO2 96%   BMI 37.42 kg/m  VS noted,  Constitutional: Pt appears in no apparent distress HENT: Head: NCAT.  Right Ear: External ear normal.  Left Ear: External ear normal.  Eyes: . Pupils are equal, round, and reactive to light. Conjunctivae and EOM are normal Bilat tm's with mild erythema.  Max sinus areas non tender.  Pharynx with mild erythema, no exudate Neck: Normal range of motion. Neck supple.  Cardiovascular: Normal rate and regular rhythm.   Pulmonary/Chest: Effort normal and breath sounds without rales or  wheezing.  Neurological: Pt is alert. Not confused , motor grossly intact Skin: Skin is warm. No rash, no LE edema Psychiatric: Pt behavior is normal. No agitation.  No other new exam findings    Assessment & Plan:

## 2016-05-14 NOTE — Patient Instructions (Signed)
Please take all new medication as prescribed - the antibiotic, and cough medicine if needed  Please continue all other medications as before, and refills have been done if requested.  Please have the pharmacy call with any other refills you may need.  Please keep your appointments with your specialists as you may have planned     

## 2016-05-16 NOTE — Assessment & Plan Note (Signed)
Mild to mod, c/w bronchtis vs pna, declines cxr, for antibx course, cough med prn, to f/u any worsening symptoms or concerns

## 2016-05-16 NOTE — Assessment & Plan Note (Signed)
stable overall by history and exam, recent data reviewed with pt, and pt to continue medical treatment as before,  to f/u any worsening symptoms or concerns BP Readings from Last 3 Encounters:  05/14/16 128/78  11/26/15 (!) 150/90  09/19/15 134/86

## 2016-06-11 ENCOUNTER — Ambulatory Visit (INDEPENDENT_AMBULATORY_CARE_PROVIDER_SITE_OTHER): Payer: 59

## 2016-06-11 DIAGNOSIS — Z23 Encounter for immunization: Secondary | ICD-10-CM | POA: Diagnosis not present

## 2016-09-23 ENCOUNTER — Encounter: Payer: Self-pay | Admitting: Gynecology

## 2016-10-14 ENCOUNTER — Ambulatory Visit (INDEPENDENT_AMBULATORY_CARE_PROVIDER_SITE_OTHER): Payer: 59 | Admitting: Internal Medicine

## 2016-10-14 VITALS — BP 138/86 | HR 74 | Ht 65.5 in | Wt 224.0 lb

## 2016-10-14 DIAGNOSIS — J302 Other seasonal allergic rhinitis: Secondary | ICD-10-CM

## 2016-10-14 DIAGNOSIS — I10 Essential (primary) hypertension: Secondary | ICD-10-CM

## 2016-10-14 DIAGNOSIS — J019 Acute sinusitis, unspecified: Secondary | ICD-10-CM

## 2016-10-14 DIAGNOSIS — J329 Chronic sinusitis, unspecified: Secondary | ICD-10-CM | POA: Insufficient documentation

## 2016-10-14 MED ORDER — HYDROCODONE-HOMATROPINE 5-1.5 MG/5ML PO SYRP: 5.0000 mL | ORAL_SOLUTION | Freq: Four times a day (QID) | ORAL | 0 refills | Status: DC | PRN

## 2016-10-14 MED ORDER — AZITHROMYCIN 250 MG PO TABS: ORAL_TABLET | ORAL | 1 refills | Status: DC

## 2016-10-14 MED ORDER — PREDNISONE 10 MG PO TABS: ORAL_TABLET | ORAL | 0 refills | Status: DC

## 2016-10-14 NOTE — Patient Instructions (Signed)
Please take all new medication as prescribed - the antibiotic, cough medicine, and prednisone ° °Please continue all other medications as before, and refills have been done if requested. ° °Please have the pharmacy call with any other refills you may need. ° °Please keep your appointments with your specialists as you may have planned ° ° ° °

## 2016-10-14 NOTE — Progress Notes (Signed)
Subjective:    Patient ID: Betty Bell, female    DOB: [birth year], 56 y.o.   MRN: [id number]  HPI  Here with 2-3 days acute onset fever, facial pain, pressure, headache, general weakness and malaise, and greenish d/c, with mild ST and cough, but pt denies chest pain, wheezing, increased sob or doe, orthopnea, PND, increased LE swelling, palpitations, dizziness or syncope.  Does have several wks ongoing nasal allergy symptoms with clearish congestion, itch and sneezing, without fever, pain, ST, cough, swelling or wheezing.  Pt denies new neurological symptoms such as new headache, or facial or extremity weakness or numbness   Pt denies polydipsia, polyuria Past Medical History:  Diagnosis Date  . Anxiety   . Asthma   . Cervical dysplasia    High Grade  . CIN I (cervical intraepithelial neoplasia I)   . Colon polyp 2015   TUBULAR ADENOMA  . Endometrial polyp   . GERD (gastroesophageal reflux disease)   . Hydrosalpinx    Left and Right  . IBS (irritable bowel syndrome)   . Rectal burning   . Seasonal allergies   . Uterine fibroid    Past Surgical History:  Procedure Laterality Date  . CARPAL TUNNEL RELEASE Left    x 2  . COLONOSCOPY  2015  . DILATION AND CURETTAGE OF UTERUS    . HYSTEROSCOPY    . LASER ABLATION OF THE CERVIX    . PELVIC LAPAROSCOPY     DL  . UTERINE FIBROID EMBOLIZATION    . wrist cystectomy Right     reports that she has never smoked. She has never used smokeless tobacco. She reports that she does not drink alcohol or use drugs. family history includes Cancer in her brother; Cancer - Other in her brother; Colon cancer (age of onset: 31) in her brother; Colon cancer (age of onset: 66) in her father; Colon polyps in her sister; Diabetes in her father and mother; Hypertension in her father, mother, and sister; Irritable bowel syndrome in her sister; Lung cancer in her brother; Prostate cancer in her father. Allergies  Allergen Reactions  . Aspirin    REACTION: Swelling  . Cephalosporins     Stomach cramps  . Ciprofloxacin     Stomach cramps  . Fish Allergy     Shortness of breath  . Nitrofurantoin Monohyd Macro     Stomach cramps  . Penicillins     Per pt: unknown  . Protonix [Pantoprazole Sodium]     Respiratory distress  . Sulfa Antibiotics     Per pt: unknown   Current Outpatient Prescriptions on File Prior to Visit  Medication Sig Dispense Refill  . aspirin 81 MG tablet Take 81 mg by mouth once.    . Estradiol 10 MCG TABS vaginal tablet Place 1 tablet (10 mcg total) vaginally 2 (two) times a week. 8 tablet 11  . hydrochlorothiazide (MICROZIDE) 12.5 MG capsule Take 1 capsule (12.5 mg total) by mouth daily. Take one every morning 30 capsule 11  . nebivolol (BYSTOLIC) 5 MG tablet Take 1 tablet (5 mg total) by mouth daily. 42 tablet 0  . nystatin-triamcinolone (MYCOLOG II) cream Apply 1 application topically 3 (three) times daily. 30 g 2  . phenazopyridine (PYRIDIUM) 97 MG tablet Take 1 tablet (97 mg total) by mouth 3 (three) times daily as needed for pain. 10 tablet 0  . ranitidine (ZANTAC) 150 MG tablet Take 1 tablet (150 mg total) by mouth 2 (two) times daily. 300mg  in the  morning and 150mg  in the evening. 270 tablet 3   No current facility-administered medications on file prior to visit.    Review of Systems  Constitutional: Negative for other unusual diaphoresis or sweats HENT: Negative for ear discharge or swelling Eyes: Negative for other worsening visual disturbances Respiratory: Negative for stridor or other swelling  Gastrointestinal: Negative for worsening distension or other blood Genitourinary: Negative for retention or other urinary change Musculoskeletal: Negative for other MSK pain or swelling Skin: Negative for color change or other new lesions Neurological: Negative for worsening tremors and other numbness  Psychiatric/Behavioral: Negative for worsening agitation or other fatigue All other system neg per  pt    Objective:   Physical Exam BP 138/86   Pulse 74   Ht 5' 5.5" (1.664 m)   Wt 224 lb (101.6 kg)   LMP 03/26/2011   SpO2 99%   BMI 36.71 kg/m  VS noted, mild ill Constitutional: Pt appears in NAD HENT: Head: NCAT.  Right Ear: External ear normal.  Left Ear: External ear normal.  Eyes: . Pupils are equal, round, and reactive to light. Conjunctivae and EOM are normal Bilat tm's with mild erythema.  Max sinus areas mild tender.  Pharynx with mild erythema, no exudate Nose: without d/c or deformity Neck: Neck supple. Gross normal ROM Cardiovascular: Normal rate and regular rhythm.   Pulmonary/Chest: Effort normal and breath sounds without rales or wheezing.  Neurological: Pt is alert. At baseline orientation, motor grossly intact Skin: Skin is warm. No rashes, other new lesions, no LE edema Psychiatric: Pt behavior is normal without agitation  No other exam findings    Assessment & Plan:

## 2016-10-16 NOTE — Assessment & Plan Note (Signed)
Mild to mod, for antibx course,  to f/u any worsening symptoms or concerns 

## 2016-10-16 NOTE — Assessment & Plan Note (Signed)
stable overall by history and exam, recent data reviewed with pt, and pt to continue medical treatment as before,  to f/u any worsening symptoms or concerns BP Readings from Last 3 Encounters:  10/14/16 138/86  05/14/16 128/78  11/26/15 (!) 150/90

## 2016-10-16 NOTE — Assessment & Plan Note (Signed)
Mild to mod seasonal flare, for predpac asd,  to f/u any worsening symptoms or concerns 

## 2016-10-30 ENCOUNTER — Telehealth: Payer: Self-pay | Admitting: Internal Medicine

## 2016-10-30 NOTE — Telephone Encounter (Signed)
Pt would like a call back regarding her predniSONE (DELTASONE) 10 MG tablet She has not started taking it yet she wants to make sure it will not interact with any of her OTC meds.  Please advise and call back

## 2016-10-30 NOTE — Telephone Encounter (Signed)
Pt has been informed and expressed understanding.  

## 2016-10-30 NOTE — Telephone Encounter (Signed)
Sure, this would be ok to take

## 2016-11-04 MED ORDER — METHYLPREDNISOLONE 4 MG PO TBPK: ORAL_TABLET | ORAL | 0 refills | Status: DC

## 2016-11-04 NOTE — Telephone Encounter (Signed)
Pt called stating that taking the prednisone caused her heart rate to speed up, and when she stopped taking it she noticed her cough came back, she would ike a call back

## 2016-11-04 NOTE — Telephone Encounter (Signed)
Pt has been informed.

## 2016-11-04 NOTE — Telephone Encounter (Signed)
Pt stated taking prednisone made her jittery and did not like the feeling of it. Can something else be called in for her cough?

## 2016-11-04 NOTE — Addendum Note (Signed)
Addended by: Biagio Borg on: 11/04/2016 12:54 PM   Modules accepted: Orders

## 2016-11-04 NOTE — Telephone Encounter (Signed)
Ok to change the prednisone to medrol  - done erx

## 2017-04-15 ENCOUNTER — Other Ambulatory Visit: Payer: Self-pay | Admitting: Gynecology

## 2017-04-15 DIAGNOSIS — Z1231 Encounter for screening mammogram for malignant neoplasm of breast: Secondary | ICD-10-CM

## 2017-04-20 ENCOUNTER — Ambulatory Visit: Payer: 59 | Admitting: Women's Health

## 2017-04-22 ENCOUNTER — Ambulatory Visit: Payer: 59 | Admitting: Internal Medicine

## 2017-04-22 ENCOUNTER — Ambulatory Visit (INDEPENDENT_AMBULATORY_CARE_PROVIDER_SITE_OTHER): Payer: 59 | Admitting: Internal Medicine

## 2017-04-22 ENCOUNTER — Encounter: Payer: Self-pay | Admitting: Internal Medicine

## 2017-04-22 VITALS — BP 160/90 | HR 97 | Temp 99.2°F | Resp 16 | Ht 65.5 in | Wt 215.5 lb

## 2017-04-22 DIAGNOSIS — R059 Cough, unspecified: Secondary | ICD-10-CM

## 2017-04-22 DIAGNOSIS — R6889 Other general symptoms and signs: Secondary | ICD-10-CM | POA: Diagnosis not present

## 2017-04-22 DIAGNOSIS — J4521 Mild intermittent asthma with (acute) exacerbation: Secondary | ICD-10-CM | POA: Diagnosis not present

## 2017-04-22 DIAGNOSIS — R05 Cough: Secondary | ICD-10-CM | POA: Diagnosis not present

## 2017-04-22 DIAGNOSIS — J45909 Unspecified asthma, uncomplicated: Secondary | ICD-10-CM | POA: Insufficient documentation

## 2017-04-22 LAB — POC INFLUENZA A&B (BINAX/QUICKVUE)
Influenza A, POC: NEGATIVE
Influenza B, POC: NEGATIVE

## 2017-04-22 LAB — POCT EXHALED NITRIC OXIDE: FeNO level (ppb): 26

## 2017-04-22 MED ORDER — HYDROCODONE-HOMATROPINE 5-1.5 MG/5ML PO SYRP: 5.0000 mL | ORAL_SOLUTION | Freq: Three times a day (TID) | ORAL | 0 refills | Status: DC | PRN

## 2017-04-22 MED ORDER — METHYLPREDNISOLONE 4 MG PO TBPK: ORAL_TABLET | ORAL | 0 refills | Status: DC

## 2017-04-22 NOTE — Progress Notes (Signed)
Subjective:  Patient ID: Betty Bell, female    DOB: 05-Nov-1960  Age: 56 y.o. MRN: [id number]  CC: Cough   HPI Betty Bell presents for a 4-day history of mild headache, nonproductive cough, low-grade fever and chills, muscle aches, and mild wheezing.  The cough keeps her awake at night and has not responded to over-the-counter remedies.  Outpatient Medications Prior to Visit  Medication Sig Dispense Refill  . aspirin 81 MG tablet Take 81 mg by mouth once.    . hydrochlorothiazide (MICROZIDE) 12.5 MG capsule Take 1 capsule (12.5 mg total) by mouth daily. Take one every morning 30 capsule 11  . ranitidine (ZANTAC) 150 MG tablet Take 1 tablet (150 mg total) by mouth 2 (two) times daily. 300mg  in the morning and 150mg  in the evening. 270 tablet 3  . azithromycin (ZITHROMAX Z-PAK) 250 MG tablet 2 tab by mouth day 1, then 1 tab per day 6 tablet 1  . Estradiol 10 MCG TABS vaginal tablet Place 1 tablet (10 mcg total) vaginally 2 (two) times a week. 8 tablet 11  . methylPREDNISolone (MEDROL) 4 MG TBPK tablet Take as directed 21 tablet 0  . nebivolol (BYSTOLIC) 5 MG tablet Take 1 tablet (5 mg total) by mouth daily. 42 tablet 0  . nystatin-triamcinolone (MYCOLOG II) cream Apply 1 application topically 3 (three) times daily. 30 g 2  . phenazopyridine (PYRIDIUM) 97 MG tablet Take 1 tablet (97 mg total) by mouth 3 (three) times daily as needed for pain. 10 tablet 0  . predniSONE (DELTASONE) 10 MG tablet 3 tabs by mouth per day for 3 days,2tabs per day for 3 days,1tab per day for 3 days 18 tablet 0   No facility-administered medications prior to visit.     ROS Review of Systems  Constitutional: Positive for chills, fatigue and fever. Negative for diaphoresis.  HENT: Positive for congestion. Negative for facial swelling, sinus pressure, sore throat, trouble swallowing and voice change.   Eyes: Negative.   Respiratory: Positive for cough and wheezing. Negative for chest tightness, shortness  of breath and stridor.   Cardiovascular: Negative for chest pain, palpitations and leg swelling.  Gastrointestinal: Negative for abdominal pain, diarrhea, nausea and vomiting.  Endocrine: Negative.   Genitourinary: Negative.  Negative for difficulty urinating.  Musculoskeletal: Negative.   Skin: Negative.   Allergic/Immunologic: Negative.   Neurological: Negative.  Negative for dizziness and weakness.  Hematological: Negative for adenopathy. Does not bruise/bleed easily.  Psychiatric/Behavioral: Negative.     Objective:  BP (!) 160/90 (BP Location: Left Arm, Patient Position: Sitting, Cuff Size: Large)   Pulse 97   Temp 99.2 F (37.3 C) (Oral)   Resp 16   Ht 5' 5.5" (1.664 m)   Wt 215 lb 8 oz (97.8 kg)   LMP 03/26/2011   SpO2 96%   BMI 35.32 kg/m   BP Readings from Last 3 Encounters:  04/22/17 (!) 160/90  10/14/16 138/86  05/14/16 128/78    Wt Readings from Last 3 Encounters:  04/22/17 215 lb 8 oz (97.8 kg)  10/14/16 224 lb (101.6 kg)  05/14/16 218 lb (98.9 kg)    Physical Exam  Constitutional: She is oriented to person, place, and time.  Non-toxic appearance. She does not have a sickly appearance. She does not appear ill. No distress.  HENT:  Mouth/Throat: Oropharynx is clear and moist. No oropharyngeal exudate.  Eyes: Conjunctivae are normal. Left eye exhibits no discharge. No scleral icterus.  Neck: Normal range of motion. Neck supple. No  JVD present. No thyromegaly present.  Cardiovascular: Normal rate, regular rhythm and normal heart sounds.  No murmur heard. Pulmonary/Chest: Effort normal. No accessory muscle usage. No tachypnea. No respiratory distress. She has no decreased breath sounds. She has no wheezes. She has rhonchi in the right middle field and the left upper field. She has no rales. She exhibits no tenderness.  There are mild, bilateral, late inspiratory rhonchi but good air movement.  Abdominal: Soft. Bowel sounds are normal. She exhibits no mass.  There is no tenderness.  Musculoskeletal: Normal range of motion. She exhibits no edema, tenderness or deformity.  Lymphadenopathy:    She has no cervical adenopathy.  Neurological: She is alert and oriented to person, place, and time.  Skin: Skin is warm and dry. No rash noted. She is not diaphoretic. No erythema. No pallor.  Vitals reviewed.   Lab Results  Component Value Date   WBC 8.2 12/13/2014   HGB 13.5 12/13/2014   HCT 40.7 12/13/2014   PLT 330.0 12/13/2014   GLUCOSE 91 12/13/2014   CHOL 177 12/13/2014   TRIG 104.0 12/13/2014   HDL 52.90 12/13/2014   LDLCALC 103 (H) 12/13/2014   ALT 48 (H) 12/13/2014   AST 33 12/13/2014   NA 138 12/13/2014   K 3.8 12/13/2014   CL 103 12/13/2014   CREATININE 0.61 12/13/2014   BUN 8 12/13/2014   CO2 27 12/13/2014   TSH 0.48 12/13/2014    Mm Digital Screening Bilateral  Result Date: 11/07/2015 CLINICAL DATA:  Screening. EXAM: DIGITAL SCREENING BILATERAL MAMMOGRAM WITH CAD COMPARISON:  Previous exam(s). ACR Breast Density Category b: There are scattered areas of fibroglandular density. FINDINGS: There are no findings suspicious for malignancy. Images were processed with CAD. IMPRESSION: No mammographic evidence of malignancy. A result letter of this screening mammogram will be mailed directly to the patient. RECOMMENDATION: Screening mammogram in one year. (Code:SM-B-01Y) BI-RADS CATEGORY  1: Negative. Electronically Signed   By: Ammie Ferrier M.D.   On: 11/13/2015 09:48    Assessment & Plan:   Kymora was seen today for cough.  Diagnoses and all orders for this visit:  Cough- she has a mildly elevated FeNO score so I will treat with a course of systemic steroids. -     POCT EXHALED NITRIC OXIDE -     HYDROcodone-homatropine (HYCODAN) 5-1.5 MG/5ML syrup; Take 5 mLs by mouth every 8 (eight) hours as needed for cough.  Flu-like symptoms- Her symptoms are viral in nature but screening for influenza is negative.  Will offer Hycodan as  needed for symptom relief. -     POC Influenza A&B (Binax test) -     HYDROcodone-homatropine (HYCODAN) 5-1.5 MG/5ML syrup; Take 5 mLs by mouth every 8 (eight) hours as needed for cough.  Mild intermittent asthmatic bronchitis with acute exacerbation- she is having a moderately severe flare up of this.  Will treat with a course of systemic steroids. -     methylPREDNISolone (MEDROL DOSEPAK) 4 MG TBPK tablet; TAKE AS DIRECTED   I have discontinued [name] "[name]"'s nebivolol, phenazopyridine, nystatin-triamcinolone, Estradiol, azithromycin, predniSONE, and methylPREDNISolone. I am also having her start on methylPREDNISolone and HYDROcodone-homatropine. Additionally, I am having her maintain her aspirin, ranitidine, and hydrochlorothiazide.  Meds ordered this encounter  Medications  . methylPREDNISolone (MEDROL DOSEPAK) 4 MG TBPK tablet    Sig: TAKE AS DIRECTED    Dispense:  21 tablet    Refill:  0  . HYDROcodone-homatropine (HYCODAN) 5-1.5 MG/5ML syrup    Sig:  Take 5 mLs by mouth every 8 (eight) hours as needed for cough.    Dispense:  120 mL    Refill:  0     Follow-up: Return in about 3 weeks (around 05/13/2017).  Scarlette Calico, MD

## 2017-04-22 NOTE — Patient Instructions (Signed)
Cough, Adult Coughing is a reflex that clears your throat and your airways. Coughing helps to heal and protect your lungs. It is normal to cough occasionally, but a cough that happens with other symptoms or lasts a long time may be a sign of a condition that needs treatment. A cough may last only 2-3 weeks (acute), or it may last longer than 8 weeks (chronic). What are the causes? Coughing is commonly caused by:  Breathing in substances that irritate your lungs.  A viral or bacterial respiratory infection.  Allergies.  Asthma.  Postnasal drip.  Smoking.  Acid backing up from the stomach into the esophagus (gastroesophageal reflux).  Certain medicines.  Chronic lung problems, including COPD (or rarely, lung cancer).  Other medical conditions such as heart failure.  Follow these instructions at home: Pay attention to any changes in your symptoms. Take these actions to help with your discomfort:  Take medicines only as told by your health care provider. ? If you were prescribed an antibiotic medicine, take it as told by your health care provider. Do not stop taking the antibiotic even if you start to feel better. ? Talk with your health care provider before you take a cough suppressant medicine.  Drink enough fluid to keep your urine clear or pale yellow.  If the air is dry, use a cold steam vaporizer or humidifier in your bedroom or your home to help loosen secretions.  Avoid anything that causes you to cough at work or at home.  If your cough is worse at night, try sleeping in a semi-upright position.  Avoid cigarette smoke. If you smoke, quit smoking. If you need help quitting, ask your health care provider.  Avoid caffeine.  Avoid alcohol.  Rest as needed.  Contact a health care provider if:  You have new symptoms.  You cough up pus.  Your cough does not get better after 2-3 weeks, or your cough gets worse.  You cannot control your cough with suppressant  medicines and you are losing sleep.  You develop pain that is getting worse or pain that is not controlled with pain medicines.  You have a fever.  You have unexplained weight loss.  You have night sweats. Get help right away if:  You cough up blood.  You have difficulty breathing.  Your heartbeat is very fast. This information is not intended to replace advice given to you by your health care provider. Make sure you discuss any questions you have with your health care provider. Document Released: 10/24/2010 Document Revised: 10/03/2015 Document Reviewed: 07/04/2014 Elsevier Interactive Patient Education  2017 [employer]  

## 2017-04-26 ENCOUNTER — Ambulatory Visit: Payer: Self-pay | Admitting: *Deleted

## 2017-04-26 NOTE — Telephone Encounter (Signed)
Pt called stating that her heart rate was increased and thought it hat it was due to her medication; pt was seen on 04/22/17 by Dr Scarlette Calico and given a medrol dose pack and other medications ; she says that she took the first 6 pills on Thursday; she says that "she has had increased heart rate since Thursday 04/22/17 pt kept her up and short of breath; stopped taking medication on Saturday because it kept her short of breath and could not sleep"; she also continues to have coughing and the cough medication does not work long; pt states that she is not as short of breath and she is coughing so much that she is not able to sleep and that she "has not felt right"; pt is most concerned about her inability to sllep due to cough;; spoke with Sam, and pt offered and accepted appointment with Dr Scarlette Calico at [phone] on 04/1817

## 2017-04-27 ENCOUNTER — Encounter: Payer: Self-pay | Admitting: Internal Medicine

## 2017-04-27 ENCOUNTER — Ambulatory Visit: Payer: 59 | Admitting: Women's Health

## 2017-04-27 ENCOUNTER — Ambulatory Visit (INDEPENDENT_AMBULATORY_CARE_PROVIDER_SITE_OTHER): Payer: 59 | Admitting: Internal Medicine

## 2017-04-27 ENCOUNTER — Ambulatory Visit (INDEPENDENT_AMBULATORY_CARE_PROVIDER_SITE_OTHER)
Admission: RE | Admit: 2017-04-27 | Discharge: 2017-04-27 | Disposition: A | Discharge status: Home / Self Care | Payer: 59 | Source: Ambulatory Visit | Attending: Internal Medicine | Admitting: Internal Medicine

## 2017-04-27 VITALS — BP 154/88 | HR 77 | Temp 98.8°F | Resp 16 | Ht 65.5 in | Wt 212.0 lb

## 2017-04-27 DIAGNOSIS — J4520 Mild intermittent asthma, uncomplicated: Secondary | ICD-10-CM

## 2017-04-27 DIAGNOSIS — B9789 Other viral agents as the cause of diseases classified elsewhere: Secondary | ICD-10-CM | POA: Diagnosis not present

## 2017-04-27 DIAGNOSIS — R059 Cough, unspecified: Secondary | ICD-10-CM

## 2017-04-27 DIAGNOSIS — J069 Acute upper respiratory infection, unspecified: Secondary | ICD-10-CM

## 2017-04-27 DIAGNOSIS — R05 Cough: Secondary | ICD-10-CM | POA: Diagnosis not present

## 2017-04-27 MED ORDER — FLUTICASONE FUROATE-VILANTEROL 200-25 MCG/INH IN AEPB: 1.0000 | INHALATION_SPRAY | Freq: Every day | RESPIRATORY_TRACT | 1 refills | Status: DC

## 2017-04-27 MED ORDER — HYDROCOD POLST-CPM POLST ER 10-8 MG/5ML PO SUER: 5.0000 mL | Freq: Two times a day (BID) | ORAL | 0 refills | Status: DC | PRN

## 2017-04-27 NOTE — Progress Notes (Signed)
Subjective:  Patient ID: Betty Bell, female    DOB: 06/28/1960  Age: 56 y.o. MRN: [id number]  CC: Cough   HPI Betty Bell presents for f/up - She tells me that her cough and shortness of breath are better but persist. The cough medicine quiets her cough for an hour or 2 but then she wakes up coughing again.  The cough is nonproductive.  She has had a few chills but she denies fever, chest pain or hemoptysis.  She took the oral steroids but says they did not help and she thinks they made her feel more short of breath so she has not continue taking them.  Outpatient Medications Prior to Visit  Medication Sig Dispense Refill  . aspirin 81 MG tablet Take 81 mg by mouth once.    . hydrochlorothiazide (MICROZIDE) 12.5 MG capsule Take 1 capsule (12.5 mg total) by mouth daily. Take one every morning 30 capsule 11  . ranitidine (ZANTAC) 150 MG tablet Take 1 tablet (150 mg total) by mouth 2 (two) times daily. 300mg  in the morning and 150mg  in the evening. 270 tablet 3  . HYDROcodone-homatropine (HYCODAN) 5-1.5 MG/5ML syrup Take 5 mLs by mouth every 8 (eight) hours as needed for cough. 120 mL 0  . methylPREDNISolone (MEDROL DOSEPAK) 4 MG TBPK tablet TAKE AS DIRECTED 21 tablet 0   No facility-administered medications prior to visit.     ROS Review of Systems  Constitutional: Positive for chills. Negative for appetite change, fatigue and fever.  HENT: Negative.  Negative for sinus pressure, sore throat and trouble swallowing.   Respiratory: Positive for cough and shortness of breath. Negative for chest tightness, wheezing and stridor.   Cardiovascular: Negative for chest pain, palpitations and leg swelling.  Gastrointestinal: Negative for abdominal pain, diarrhea, nausea and vomiting.  Endocrine: Negative.   Genitourinary: Negative.   Musculoskeletal: Negative.  Negative for back pain, myalgias and neck pain.  Skin: Negative.  Negative for rash.  Allergic/Immunologic: Negative.     Neurological: Negative.   Hematological: Negative for adenopathy. Does not bruise/bleed easily.  Psychiatric/Behavioral: Negative.     Objective:  BP (!) 154/88 (BP Location: Left Arm, Patient Position: Sitting, Cuff Size: Large)   Pulse 77   Temp 98.8 F (37.1 C) (Oral)   Resp 16   Ht 5' 5.5" (1.664 m)   Wt 212 lb (96.2 kg)   LMP 03/26/2011   SpO2 94%   BMI 34.74 kg/m   BP Readings from Last 3 Encounters:  04/27/17 (!) 154/88  04/22/17 (!) 160/90  10/14/16 138/86    Wt Readings from Last 3 Encounters:  04/27/17 212 lb (96.2 kg)  04/22/17 215 lb 8 oz (97.8 kg)  10/14/16 224 lb (101.6 kg)    Physical Exam  Constitutional: She is oriented to person, place, and time. She does not have a sickly appearance. She does not appear ill. No distress.  HENT:  Mouth/Throat: Oropharynx is clear and moist. No oropharyngeal exudate.  Eyes: Conjunctivae are normal. Left eye exhibits no discharge. No scleral icterus.  Neck: Normal range of motion. Neck supple. No JVD present. No thyromegaly present.  Cardiovascular: Normal rate, regular rhythm and normal heart sounds.  No murmur heard. Pulmonary/Chest: Effort normal and breath sounds normal. No respiratory distress. She has no wheezes. She has no rales.  Abdominal: Soft. Bowel sounds are normal. There is no tenderness.  Musculoskeletal: Normal range of motion. She exhibits no edema or tenderness.  Lymphadenopathy:    She has no cervical  adenopathy.  Neurological: She is alert and oriented to person, place, and time.  Skin: Skin is warm and dry. No rash noted. She is not diaphoretic. No erythema. No pallor.  Vitals reviewed.   Lab Results  Component Value Date   WBC 8.2 12/13/2014   HGB 13.5 12/13/2014   HCT 40.7 12/13/2014   PLT 330.0 12/13/2014   GLUCOSE 91 12/13/2014   CHOL 177 12/13/2014   TRIG 104.0 12/13/2014   HDL 52.90 12/13/2014   LDLCALC 103 (H) 12/13/2014   ALT 48 (H) 12/13/2014   AST 33 12/13/2014   NA 138  12/13/2014   K 3.8 12/13/2014   CL 103 12/13/2014   CREATININE 0.61 12/13/2014   BUN 8 12/13/2014   CO2 27 12/13/2014   TSH 0.48 12/13/2014    Mm Digital Screening Bilateral  Result Date: 11/07/2015 CLINICAL DATA:  Screening. EXAM: DIGITAL SCREENING BILATERAL MAMMOGRAM WITH CAD COMPARISON:  Previous exam(s). ACR Breast Density Category b: There are scattered areas of fibroglandular density. FINDINGS: There are no findings suspicious for malignancy. Images were processed with CAD. IMPRESSION: No mammographic evidence of malignancy. A result letter of this screening mammogram will be mailed directly to the patient. RECOMMENDATION: Screening mammogram in one year. (Code:SM-B-01Y) BI-RADS CATEGORY  1: Negative. Electronically Signed   By: Ammie Ferrier M.D.   On: 11/13/2015 09:48   Dg Chest 2 View  Result Date: 04/27/2017 CLINICAL DATA:  One week of productive cough. No fever. History of asthma, hypertension, nonsmoker. EXAM: CHEST  2 VIEW COMPARISON:  Chest x-ray and chest CT scan of February 28, 2014 FINDINGS: The lungs are adequately inflated. The interstitial markings are coarse. There is no alveolar infiltrate. There is no pleural effusion. On the lateral view in the posterior costophrenic gutter there is a subcentimeter nodular soft tissue density density not previously demonstrated. The bony thorax exhibits no acute abnormality. IMPRESSION: Chronic bronchitic-reactive airway changes. One cannot exclude acute exacerbation of asthma. No alveolar pneumonia. Subtle nodular density on the lateral view only in the posterior costophrenic gutter. A follow-up chest x-ray in 2-3 weeks is recommended to reassess not only the pulmonary parenchyma but the posterior costophrenic gutter. Electronically Signed   By: David  Martinique M.D.   On: 04/27/2017 12:54     Assessment & Plan:   Betty Bell was seen today for cough.  Diagnoses and all orders for this visit:  Cough-her chest x-ray is negative for mass,  edema, or infiltrate. -     chlorpheniramine-HYDROcodone (TUSSIONEX PENNKINETIC ER) 10-8 MG/5ML SUER; Take 5 mLs by mouth every 12 (twelve) hours as needed for cough. -     DG Chest 2 View; Future  Viral URI with cough- I have asked her to discontinue the Hycodan since it did not provide her long lasting relief from the cough and will upgrade to Tussionex suspension. -     chlorpheniramine-HYDROcodone (TUSSIONEX PENNKINETIC ER) 10-8 MG/5ML SUER; Take 5 mLs by mouth every 12 (twelve) hours as needed for cough.  Mild intermittent asthma without complication- She recently had a slightly elevated FeNO score.  Her chest x-ray today raises concern for asthma.  Since she did not tolerate the oral steroids so I have asked her to start using a LABA/ICS combination inhaler. -     fluticasone furoate-vilanterol (BREO ELLIPTA) 200-25 MCG/INH AEPB; Inhale 1 puff into the lungs daily.   I have discontinued [name] "[name]"'s methylPREDNISolone and HYDROcodone-homatropine. I am also having her start on chlorpheniramine-HYDROcodone and fluticasone furoate-vilanterol. Additionally, I am  having her maintain her aspirin, ranitidine, and hydrochlorothiazide.  Meds ordered this encounter  Medications  . chlorpheniramine-HYDROcodone (TUSSIONEX PENNKINETIC ER) 10-8 MG/5ML SUER    Sig: Take 5 mLs by mouth every 12 (twelve) hours as needed for cough.    Dispense:  140 mL    Refill:  0  . fluticasone furoate-vilanterol (BREO ELLIPTA) 200-25 MCG/INH AEPB    Sig: Inhale 1 puff into the lungs daily.    Dispense:  90 each    Refill:  1     Follow-up: Return in about 3 weeks (around 05/18/2017).  Scarlette Calico, MD

## 2017-04-27 NOTE — Patient Instructions (Signed)
Cough, Adult Coughing is a reflex that clears your throat and your airways. Coughing helps to heal and protect your lungs. It is normal to cough occasionally, but a cough that happens with other symptoms or lasts a long time may be a sign of a condition that needs treatment. A cough may last only 2-3 weeks (acute), or it may last longer than 8 weeks (chronic). What are the causes? Coughing is commonly caused by:  Breathing in substances that irritate your lungs.  A viral or bacterial respiratory infection.  Allergies.  Asthma.  Postnasal drip.  Smoking.  Acid backing up from the stomach into the esophagus (gastroesophageal reflux).  Certain medicines.  Chronic lung problems, including COPD (or rarely, lung cancer).  Other medical conditions such as heart failure.  Follow these instructions at home: Pay attention to any changes in your symptoms. Take these actions to help with your discomfort:  Take medicines only as told by your health care provider. ? If you were prescribed an antibiotic medicine, take it as told by your health care provider. Do not stop taking the antibiotic even if you start to feel better. ? Talk with your health care provider before you take a cough suppressant medicine.  Drink enough fluid to keep your urine clear or pale yellow.  If the air is dry, use a cold steam vaporizer or humidifier in your bedroom or your home to help loosen secretions.  Avoid anything that causes you to cough at work or at home.  If your cough is worse at night, try sleeping in a semi-upright position.  Avoid cigarette smoke. If you smoke, quit smoking. If you need help quitting, ask your health care provider.  Avoid caffeine.  Avoid alcohol.  Rest as needed.  Contact a health care provider if:  You have new symptoms.  You cough up pus.  Your cough does not get better after 2-3 weeks, or your cough gets worse.  You cannot control your cough with suppressant  medicines and you are losing sleep.  You develop pain that is getting worse or pain that is not controlled with pain medicines.  You have a fever.  You have unexplained weight loss.  You have night sweats. Get help right away if:  You cough up blood.  You have difficulty breathing.  Your heartbeat is very fast. This information is not intended to replace advice given to you by your health care provider. Make sure you discuss any questions you have with your health care provider. Document Released: 10/24/2010 Document Revised: 10/03/2015 Document Reviewed: 07/04/2014 Elsevier Interactive Patient Education  2017 [employer]  

## 2017-05-07 ENCOUNTER — Telehealth: Payer: Self-pay | Admitting: Internal Medicine

## 2017-05-07 NOTE — Telephone Encounter (Signed)
Yes, stop using it

## 2017-05-07 NOTE — Telephone Encounter (Signed)
Pt reports inhaler prescribed for her 04/27/17 is "making her cough more." States cough was "gone" but immediately after using inhaler, starts coughing, nonproductive. Has continued to use daily as prescribed  but asking if she could stop medication. Fluticasone Furoate-Vilanterol (BREO ELLIPTA) 200-25 MCG/INH AEPB 1 puff daily.

## 2017-05-10 NOTE — Telephone Encounter (Signed)
Pt informed that she can stop using the inhaler.  Pt stated understanding and states that her symptoms have cleared up.

## 2017-05-21 ENCOUNTER — Telehealth: Payer: Self-pay

## 2017-05-21 DIAGNOSIS — J40 Bronchitis, not specified as acute or chronic: Secondary | ICD-10-CM

## 2017-05-21 NOTE — Telephone Encounter (Signed)
Pt contacted in stated that she is feeling better. Pt will go back and have repeat xray done next week. Order has been entered.  Encounter closed.

## 2017-05-21 NOTE — Telephone Encounter (Signed)
PCP requested to contact pt to come back for a repeat chest xray.   No vm space to leave a message.

## 2017-05-27 ENCOUNTER — Encounter: Payer: 59 | Admitting: Gynecology

## 2017-05-27 ENCOUNTER — Ambulatory Visit (INDEPENDENT_AMBULATORY_CARE_PROVIDER_SITE_OTHER)
Admission: RE | Admit: 2017-05-27 | Discharge: 2017-05-27 | Disposition: A | Discharge status: Home / Self Care | Payer: 59 | Source: Ambulatory Visit | Attending: Internal Medicine | Admitting: Internal Medicine

## 2017-05-27 DIAGNOSIS — J40 Bronchitis, not specified as acute or chronic: Secondary | ICD-10-CM | POA: Diagnosis not present

## 2017-05-28 ENCOUNTER — Other Ambulatory Visit: Payer: Self-pay | Admitting: Internal Medicine

## 2017-05-28 DIAGNOSIS — I10 Essential (primary) hypertension: Secondary | ICD-10-CM

## 2017-05-28 DIAGNOSIS — R911 Solitary pulmonary nodule: Secondary | ICD-10-CM | POA: Insufficient documentation

## 2017-06-03 ENCOUNTER — Other Ambulatory Visit (INDEPENDENT_AMBULATORY_CARE_PROVIDER_SITE_OTHER): Payer: 59

## 2017-06-03 DIAGNOSIS — I10 Essential (primary) hypertension: Secondary | ICD-10-CM

## 2017-06-03 LAB — BASIC METABOLIC PANEL
BUN: 8 mg/dL (ref 6–23)
CO2: 27 mEq/L (ref 19–32)
Calcium: 9.4 mg/dL (ref 8.4–10.5)
Chloride: 104 mEq/L (ref 96–112)
Creatinine, Ser: 0.58 mg/dL (ref 0.40–1.20)
GFR: 137.71 mL/min (ref >60.00)
Glucose, Bld: 133 mg/dL — ABNORMAL HIGH (ref 70–99)
Potassium: 4.2 mEq/L (ref 3.5–5.1)
Sodium: 139 mEq/L (ref 135–145)

## 2017-06-10 ENCOUNTER — Ambulatory Visit (INDEPENDENT_AMBULATORY_CARE_PROVIDER_SITE_OTHER)
Admission: RE | Admit: 2017-06-10 | Discharge: 2017-06-10 | Disposition: A | Discharge status: Home / Self Care | Payer: 59 | Source: Ambulatory Visit | Attending: Internal Medicine | Admitting: Internal Medicine

## 2017-06-10 DIAGNOSIS — R911 Solitary pulmonary nodule: Secondary | ICD-10-CM | POA: Diagnosis not present

## 2017-06-10 MED ORDER — IOPAMIDOL (ISOVUE-300) INJECTION 61%
80.0000 mL | Freq: Once | INTRAVENOUS | Status: AC | PRN
  Administered 2017-06-10: 80 mL via INTRAVENOUS

## 2017-07-29 ENCOUNTER — Ambulatory Visit (INDEPENDENT_AMBULATORY_CARE_PROVIDER_SITE_OTHER): Payer: 59

## 2017-07-29 DIAGNOSIS — Z299 Encounter for prophylactic measures, unspecified: Secondary | ICD-10-CM | POA: Diagnosis not present

## 2017-07-29 DIAGNOSIS — Z23 Encounter for immunization: Secondary | ICD-10-CM | POA: Diagnosis not present

## 2017-12-06 ENCOUNTER — Other Ambulatory Visit: Payer: Self-pay | Admitting: Internal Medicine

## 2017-12-06 DIAGNOSIS — Z1231 Encounter for screening mammogram for malignant neoplasm of breast: Secondary | ICD-10-CM

## 2017-12-28 ENCOUNTER — Ambulatory Visit
Admission: RE | Admit: 2017-12-28 | Discharge: 2017-12-28 | Disposition: A | Discharge status: Home / Self Care | Payer: 59 | Source: Ambulatory Visit | Attending: Internal Medicine | Admitting: Internal Medicine

## 2017-12-28 DIAGNOSIS — Z1231 Encounter for screening mammogram for malignant neoplasm of breast: Secondary | ICD-10-CM

## 2017-12-28 LAB — HM MAMMOGRAPHY

## 2018-01-03 ENCOUNTER — Ambulatory Visit: Payer: 59 | Admitting: Gastroenterology

## 2018-01-17 ENCOUNTER — Ambulatory Visit (INDEPENDENT_AMBULATORY_CARE_PROVIDER_SITE_OTHER): Payer: 59 | Admitting: Family

## 2018-01-17 ENCOUNTER — Ambulatory Visit: Payer: 59 | Admitting: Internal Medicine

## 2018-01-17 ENCOUNTER — Encounter: Payer: Self-pay | Admitting: Family

## 2018-01-17 VITALS — BP 150/84 | HR 68 | Temp 98.1°F | Ht 65.5 in | Wt 211.0 lb

## 2018-01-17 DIAGNOSIS — J209 Acute bronchitis, unspecified: Secondary | ICD-10-CM | POA: Diagnosis not present

## 2018-01-17 MED ORDER — HYDROCOD POLST-CPM POLST ER 10-8 MG/5ML PO SUER: 5.0000 mL | Freq: Every evening | ORAL | 0 refills | Status: DC | PRN

## 2018-01-17 MED ORDER — AZITHROMYCIN 250 MG PO TABS: ORAL_TABLET | ORAL | 0 refills | Status: DC

## 2018-01-17 NOTE — Progress Notes (Signed)
Betty Bell is a 57 y.o. female with the following history as recorded in [employer]:  Patient Active Problem List   Diagnosis Date Noted  . Lung nodule, solitary 05/28/2017  . Mild intermittent asthma without complication 12/87/8676  . Obesity (BMI 35.0-39.9 without comorbidity) 12/13/2014  . Viral URI with cough 07/13/2014  . Essential hypertension 07/13/2014  . Intramural leiomyoma of uterus 06/19/2014  . Other screening mammogram 11/16/2013  . Panic attacks 11/16/2013  . Cough 04/28/2012  . Seasonal allergies   . Routine general medical examination at a health care facility 03/30/2011  . GERD 01/26/2008    Current Outpatient Medications  Medication Sig Dispense Refill  . aspirin 81 MG tablet Take 81 mg by mouth once.    . cetirizine (ZYRTEC) 10 MG tablet Take 10 mg by mouth daily.    . ranitidine (ZANTAC) 150 MG tablet Take 1 tablet (150 mg total) by mouth 2 (two) times daily. 300mg  in the morning and 150mg  in the evening. 270 tablet 3  . azithromycin (ZITHROMAX) 250 MG tablet 2 tabs po qd x 1 day; 1 tablet per day x 4 days; 6 tablet 0  . chlorpheniramine-HYDROcodone (TUSSIONEX PENNKINETIC ER) 10-8 MG/5ML SUER Take 5 mLs by mouth at bedtime as needed for cough. 115 mL 0   No current facility-administered medications for this visit.     Allergies: Aspirin; Cephalosporins; Ciprofloxacin; Fish allergy; Nitrofurantoin monohyd macro; Penicillins; Protonix [pantoprazole sodium]; and Sulfa antibiotics  Past Medical History:  Diagnosis Date  . Anxiety   . Asthma   . Cervical dysplasia    High Grade  . CIN I (cervical intraepithelial neoplasia I)   . Colon polyp 2015   TUBULAR ADENOMA  . Endometrial polyp   . GERD (gastroesophageal reflux disease)   . Hydrosalpinx    Left and Right  . IBS (irritable bowel syndrome)   . Rectal burning   . Seasonal allergies   . Uterine fibroid     Past Surgical History:  Procedure Laterality Date  . CARPAL TUNNEL RELEASE Left    x 2  .  COLONOSCOPY  2015  . DILATION AND CURETTAGE OF UTERUS    . HYSTEROSCOPY    . LASER ABLATION OF THE CERVIX    . PELVIC LAPAROSCOPY     DL  . UTERINE FIBROID EMBOLIZATION    . wrist cystectomy Right     Family History  Problem Relation Age of Onset  . Diabetes Mother   . Hypertension Mother   . Diabetes Father   . Hypertension Father   . Prostate cancer Father   . Colon cancer Father 30  . Colon cancer Brother 45  . Cancer Brother        esophagus, skin  . Colon polyps Sister   . Hypertension Sister   . Lung cancer Brother   . Cancer - Other Brother        esophagal  . Kidney disease Unknown   . Irritable bowel syndrome Sister   . COPD Neg Hx   . Alcohol abuse Neg Hx   . Depression Neg Hx   . Drug abuse Neg Hx   . Early death Neg Hx   . Hearing loss Neg Hx   . Heart disease Neg Hx   . Hyperlipidemia Neg Hx   . Stroke Neg Hx     Social History   Tobacco Use  . Smoking status: Never Smoker  . Smokeless tobacco: Never Used  . Tobacco comment: Regular Exercise - Yes  Substance Use Topics  . Alcohol use: No    Alcohol/week: 0.0 standard drinks    Comment: rare    Subjective:  Patient presents with concerns for 1 week history of cough/ congestion; prone to recurrent bronchitis; requesting refill on cough syrup; + seasonal allergies; no chest pain, no shortness of breath;   Objective:  Vitals:   01/17/18 1603  BP: (!) 150/84  Pulse: 68  Temp: 98.1 F (36.7 C)  TempSrc: Oral  SpO2: 98%  Weight: 211 lb 0.6 oz (95.7 kg)  Height: 5' 5.5" (1.664 m)    General: Well developed, well nourished, in no acute distress  Skin : Warm and dry.  Head: Normocephalic and atraumatic  Eyes: Sclera and conjunctiva clear; pupils round and reactive to light; extraocular movements intact  Ears: External normal; canals clear; tympanic membranes normal  Oropharynx: Pink, supple. No suspicious lesions  Neck: Supple without thyromegaly, adenopathy  Lungs: Respirations unlabored;  clear to auscultation bilaterally without wheeze, rales, rhonchi  CVS exam: normal rate and regular rhythm.  Neurologic: Alert and oriented; speech intact; face symmetrical; moves all extremities well; CNII-XII intact without focal deficit   Assessment:  1. Acute bronchitis, unspecified organism     Plan:  Suspect allergy related; Rx for Z-pak #1 take as directed, sample of BREO 100 mg qd and refill on Tussionex; increase fluids, rest and follow-up worse, no better.    Encourage to schedule CPE with her PCP- ? Need to re-start blood pressure medication.  No follow-ups on file.  No orders of the defined types were placed in this encounter.   Requested Prescriptions   Signed Prescriptions Disp Refills  . azithromycin (ZITHROMAX) 250 MG tablet 6 tablet 0    Sig: 2 tabs po qd x 1 day; 1 tablet per day x 4 days;  . chlorpheniramine-HYDROcodone (TUSSIONEX PENNKINETIC ER) 10-8 MG/5ML SUER 115 mL 0    Sig: Take 5 mLs by mouth at bedtime as needed for cough.

## 2018-01-25 ENCOUNTER — Ambulatory Visit: Payer: 59 | Admitting: Gastroenterology

## 2018-02-03 ENCOUNTER — Ambulatory Visit (INDEPENDENT_AMBULATORY_CARE_PROVIDER_SITE_OTHER): Payer: 59 | Admitting: Gastroenterology

## 2018-02-03 ENCOUNTER — Encounter: Payer: Self-pay | Admitting: Gastroenterology

## 2018-02-03 ENCOUNTER — Encounter: Payer: 59 | Admitting: Internal Medicine

## 2018-02-03 VITALS — BP 140/90 | HR 83 | Ht 65.0 in | Wt 211.0 lb

## 2018-02-03 DIAGNOSIS — R1013 Epigastric pain: Secondary | ICD-10-CM

## 2018-02-03 DIAGNOSIS — K219 Gastro-esophageal reflux disease without esophagitis: Secondary | ICD-10-CM | POA: Diagnosis not present

## 2018-02-03 DIAGNOSIS — R140 Abdominal distension (gaseous): Secondary | ICD-10-CM | POA: Diagnosis not present

## 2018-02-03 MED ORDER — FAMOTIDINE 20 MG PO TABS: 20.0000 mg | ORAL_TABLET | Freq: Two times a day (BID) | ORAL | 3 refills | Status: DC

## 2018-02-03 NOTE — Patient Instructions (Signed)
If you are age 58 or older, your body mass index should be between 23-30. Your Body mass index is 35.11 kg/m. If this is out of the aforementioned range listed, please consider follow up with your Primary Care Provider.  If you are age 109 or younger, your body mass index should be between 19-25. Your Body mass index is 35.11 kg/m. If this is out of the aformentioned range listed, please consider follow up with your Primary Care Provider.   You have been scheduled for an endoscopy. Please follow written instructions given to you at your visit today. If you use inhalers (even only as needed), please bring them with you on the day of your procedure. Your physician has requested that you go to www.startemmi.com and enter the access code given to you at your visit today. This web site gives a general overview about your procedure. However, you should still follow specific instructions given to you by our office regarding your preparation for the procedure.  We have sent the following medications to your pharmacy for you to pick up at your convenience: Pepcid 20 mg  DISCONTINUE ZANTAC.  You have been given samples of FDgard along with coupons.  Thank you for choosing me and [facility].   Alonza Bogus, PA-C

## 2018-02-03 NOTE — Progress Notes (Addendum)
02/03/2018 Betty Bell [id number] 16-Aug-1960   HISTORY OF PRESENT ILLNESS:  This is a 57 year old female who is previously known to Dr. Olevia Perches. Her care will now be assumed by Dr. Tarri Glenn. She has history of GERD and usually takes ranitidine 300 mg in the morning and 150 mg in the evening and she has been on that for quite some time.  She has not been here over 3 years. She has tried Nexium in the past but thinks did it made her feel unwell. She has history of EGD in September 2010 by Dr. Collene Mares at which time she was found to have multiple antral erosions, but otherwise was normal. She presents to our office today with complaints of epigastric abdominal pain and worsening reflux despite her current regimen.  Has some nausea as well, but denies vomiting.  No dysphagia or weight loss.  Complaining of bloating and tightness in upper abdomen.      Past Medical History:  Diagnosis Date  . Anxiety   . Asthma   . Cervical dysplasia    High Grade  . CIN I (cervical intraepithelial neoplasia I)   . Colon polyp 2015   TUBULAR ADENOMA  . Endometrial polyp   . GERD (gastroesophageal reflux disease)   . Hydrosalpinx    Left and Right  . IBS (irritable bowel syndrome)   . Rectal burning   . Seasonal allergies   . Uterine fibroid    Past Surgical History:  Procedure Laterality Date  . CARPAL TUNNEL RELEASE Left    x 2  . COLONOSCOPY  2015  . DILATION AND CURETTAGE OF UTERUS    . HYSTEROSCOPY    . LASER ABLATION OF THE CERVIX    . PELVIC LAPAROSCOPY     DL  . UTERINE FIBROID EMBOLIZATION    . wrist cystectomy Right     reports that she has never smoked. She has never used smokeless tobacco. She reports that she does not drink alcohol or use drugs. family history includes Cancer in her brother; Cancer - Other in her brother; Colon cancer (age of onset: 26) in her brother; Colon cancer (age of onset: 46) in her father; Colon polyps in her sister; Diabetes in her father and mother;  Hypertension in her father, mother, and sister; Irritable bowel syndrome in her sister; Kidney disease in her unknown relative; Lung cancer in her brother; Prostate cancer in her father. Allergies  Allergen Reactions  . Aspirin     REACTION: Swelling  . Cephalosporins     Stomach cramps  . Ciprofloxacin     Stomach cramps  . Fish Allergy     Shortness of breath  . Nitrofurantoin Monohyd Macro     Stomach cramps  . Penicillins     Per pt: unknown  . Protonix [Pantoprazole Sodium]     Respiratory distress  . Sulfa Antibiotics     Per pt: unknown      Outpatient Encounter Medications as of 02/03/2018  Medication Sig  . aspirin 81 MG tablet Take 81 mg by mouth once.  . cetirizine (ZYRTEC) 10 MG tablet Take 10 mg by mouth daily.  . ranitidine (ZANTAC) 150 MG tablet Take 1 tablet (150 mg total) by mouth 2 (two) times daily. 300mg  in the morning and 150mg  in the evening.  . [DISCONTINUED] azithromycin (ZITHROMAX) 250 MG tablet 2 tabs po qd x 1 day; 1 tablet per day x 4 days;  . [DISCONTINUED] chlorpheniramine-HYDROcodone (TUSSIONEX PENNKINETIC ER) 10-8 MG/5ML SUER  Take 5 mLs by mouth at bedtime as needed for cough.   No facility-administered encounter medications on file as of 02/03/2018.      REVIEW OF SYSTEMS  : All other systems reviewed and negative except where noted in the History of Present Illness.   PHYSICAL EXAM: BP 140/90   Pulse 83   Ht 5\' 5"  (1.651 m)   Wt 211 lb (95.7 kg)   LMP 03/26/2011   BMI 35.11 kg/m  General: Well developed white female in no acute distress Head: Normocephalic and atraumatic Eyes:  Sclerae anicteric, conjunctiva pink. Ears: Normal auditory acuity Lungs: Clear throughout to auscultation; no increased WOB. Heart: Regular rate and rhythm; no M/R/G. Abdomen: Soft, non-distended.  BS present.  Mild epigastric TTP. Musculoskeletal: Symmetrical with no gross deformities  Skin: No lesions on visible extremities Extremities: No edema    Neurological: Alert oriented x 4, grossly non-focal Psychological:  Alert and cooperative. Normal mood and affect  ASSESSMENT AND PLAN: *Epigastric abdominal pain, GERD, bloating:  Is on zantac 300 mg in the morning and 150 mg in the evening and continuing to have symptoms.  Has been on this regimen for quite some time.  Will discontinue zantac and switch to Pepcid 20 mg BID for now (patient prefers this over PPI).  Will plan for EGD.  Will give samples of and coupon cards for FDgard to try prn as well.  **The risks, benefits, and alternatives to EGD were discussed with the patient and she consents to proceed.    CC:  Janith Lima, MD

## 2018-02-04 NOTE — Progress Notes (Signed)
I agree with the above note, plan 

## 2018-02-14 ENCOUNTER — Encounter: Payer: Self-pay | Admitting: Gastroenterology

## 2018-02-24 ENCOUNTER — Encounter: Payer: Self-pay | Admitting: Internal Medicine

## 2018-02-24 ENCOUNTER — Other Ambulatory Visit (INDEPENDENT_AMBULATORY_CARE_PROVIDER_SITE_OTHER): Payer: 59

## 2018-02-24 ENCOUNTER — Ambulatory Visit (INDEPENDENT_AMBULATORY_CARE_PROVIDER_SITE_OTHER): Payer: 59 | Admitting: Internal Medicine

## 2018-02-24 VITALS — BP 140/80 | HR 89 | Temp 98.1°F | Ht 65.0 in | Wt 212.0 lb

## 2018-02-24 DIAGNOSIS — Z23 Encounter for immunization: Secondary | ICD-10-CM

## 2018-02-24 DIAGNOSIS — R739 Hyperglycemia, unspecified: Secondary | ICD-10-CM

## 2018-02-24 DIAGNOSIS — Z0000 Encounter for general adult medical examination without abnormal findings: Secondary | ICD-10-CM

## 2018-02-24 DIAGNOSIS — I10 Essential (primary) hypertension: Secondary | ICD-10-CM

## 2018-02-24 DIAGNOSIS — J4520 Mild intermittent asthma, uncomplicated: Secondary | ICD-10-CM | POA: Diagnosis not present

## 2018-02-24 DIAGNOSIS — K219 Gastro-esophageal reflux disease without esophagitis: Secondary | ICD-10-CM | POA: Diagnosis not present

## 2018-02-24 LAB — URINALYSIS, ROUTINE W REFLEX MICROSCOPIC
Bilirubin Urine: NEGATIVE
Hgb urine dipstick: NEGATIVE
Ketones, ur: NEGATIVE
Leukocytes, UA: NEGATIVE
Nitrite: NEGATIVE
RBC / HPF: NONE SEEN (ref 0–?)
Specific Gravity, Urine: <=1.005 — AB (ref 1.000–1.030)
Total Protein, Urine: NEGATIVE
Urine Glucose: NEGATIVE
Urobilinogen, UA: 0.2 (ref 0.0–1.0)
WBC, UA: NONE SEEN (ref 0–?)
pH: 6 (ref 5.0–8.0)

## 2018-02-24 LAB — COMPREHENSIVE METABOLIC PANEL
ALT: 14 U/L (ref 0–35)
AST: 14 U/L (ref 0–37)
Albumin: 4.3 g/dL (ref 3.5–5.2)
Alkaline Phosphatase: 88 U/L (ref 39–117)
BUN: 11 mg/dL (ref 6–23)
CO2: 26 mEq/L (ref 19–32)
Calcium: 9.5 mg/dL (ref 8.4–10.5)
Chloride: 105 mEq/L (ref 96–112)
Creatinine, Ser: 0.71 mg/dL (ref 0.40–1.20)
GFR: 108.77 mL/min (ref >60.00)
Glucose, Bld: 91 mg/dL (ref 70–99)
Potassium: 3.9 mEq/L (ref 3.5–5.1)
Sodium: 140 mEq/L (ref 135–145)
Total Bilirubin: 0.3 mg/dL (ref 0.2–1.2)
Total Protein: 7.6 g/dL (ref 6.0–8.3)

## 2018-02-24 LAB — LIPID PANEL
Cholesterol: 183 mg/dL (ref 0–200)
HDL: 52 mg/dL (ref >39.00)
LDL Cholesterol: 115 mg/dL — ABNORMAL HIGH (ref 0–99)
NonHDL: 131.46
Total CHOL/HDL Ratio: 4
Triglycerides: 84 mg/dL (ref 0.0–149.0)
VLDL: 16.8 mg/dL (ref 0.0–40.0)

## 2018-02-24 LAB — CBC WITH DIFFERENTIAL/PLATELET
Basophils Absolute: 0.1 10*3/uL (ref 0.0–0.1)
Basophils Relative: 1.2 % (ref 0.0–3.0)
Eosinophils Absolute: 0.1 10*3/uL (ref 0.0–0.7)
Eosinophils Relative: 1.1 % (ref 0.0–5.0)
HCT: 40.7 % (ref 36.0–46.0)
Hemoglobin: 13.4 g/dL (ref 12.0–15.0)
Lymphocytes Relative: 38.5 % (ref 12.0–46.0)
Lymphs Abs: 2.7 10*3/uL (ref 0.7–4.0)
MCHC: 32.8 g/dL (ref 30.0–36.0)
MCV: 82.7 fl (ref 78.0–100.0)
Monocytes Absolute: 0.7 10*3/uL (ref 0.1–1.0)
Monocytes Relative: 9.4 % (ref 3.0–12.0)
Neutro Abs: 3.5 10*3/uL (ref 1.4–7.7)
Neutrophils Relative %: 49.8 % (ref 43.0–77.0)
Platelets: 326 10*3/uL (ref 150.0–400.0)
RBC: 4.93 Mil/uL (ref 3.87–5.11)
RDW: 14.8 % (ref 11.5–15.5)
WBC: 7.1 10*3/uL (ref 4.0–10.5)

## 2018-02-24 LAB — TSH: TSH: 0.47 u[IU]/mL (ref 0.35–4.50)

## 2018-02-24 LAB — HEMOGLOBIN A1C: Hgb A1c MFr Bld: 5.4 % (ref 4.6–6.5)

## 2018-02-24 NOTE — Progress Notes (Signed)
Subjective:  Patient ID: Betty Bell, female    DOB: [birth year]  Age: 57 y.o. MRN: [id number]  CC: Annual Exam   HPI Betty Bell presents for a CPX.  She has been experiencing epigastric pain for about a year.  She tells me that she has been seeing GI and is planning to undergo an upper endoscopy soon.  She describes it as a pressure does that does not interfere with her appetite or her ability to eat.  She is not losing weight and she denies nausea, vomiting, melena, constipation, or diarrhea.  Outpatient Medications Prior to Visit  Medication Sig Dispense Refill  . aspirin 81 MG tablet Take 81 mg by mouth once.    . cetirizine (ZYRTEC) 10 MG tablet Take 10 mg by mouth daily.    . famotidine (PEPCID) 20 MG tablet Take 1 tablet (20 mg total) by mouth 2 (two) times daily. 60 tablet 3   No facility-administered medications prior to visit.     ROS Review of Systems  Constitutional: Negative for diaphoresis, fatigue and unexpected weight change.  HENT: Negative.   Eyes: Negative for visual disturbance.  Respiratory: Negative for cough, chest tightness, shortness of breath and wheezing.   Cardiovascular: Negative for chest pain, palpitations and leg swelling.  Gastrointestinal: Positive for abdominal pain. Negative for abdominal distention, blood in stool, constipation, diarrhea, nausea and vomiting.  Endocrine: Negative.   Genitourinary: Negative for difficulty urinating and vaginal pain.  Musculoskeletal: Negative.  Negative for arthralgias, gait problem and joint swelling.  Skin: Negative.  Negative for color change and pallor.  Neurological: Negative.  Negative for dizziness, weakness and light-headedness.  Hematological: Negative for adenopathy. Does not bruise/bleed easily.  Psychiatric/Behavioral: Negative.     Objective:  BP 140/80 (BP Location: Left Arm, Patient Position: Sitting, Cuff Size: Large)   Pulse 89   Temp 98.1 F (36.7 C) (Oral)   Ht 5\' 5"  (1.651 m)    Wt 212 lb (96.2 kg)   LMP 03/26/2011   SpO2 98%   BMI 35.28 kg/m   BP Readings from Last 3 Encounters:  02/24/18 140/80  02/03/18 140/90  01/17/18 (!) 150/84    Wt Readings from Last 3 Encounters:  02/24/18 212 lb (96.2 kg)  02/03/18 211 lb (95.7 kg)  01/17/18 211 lb 0.6 oz (95.7 kg)    Physical Exam  Constitutional: She is oriented to person, place, and time. No distress.  HENT:  Mouth/Throat: Oropharynx is clear and moist. No oropharyngeal exudate.  Eyes: Conjunctivae are normal. No scleral icterus.  Neck: Normal range of motion. Neck supple. No JVD present. No thyromegaly present.  Cardiovascular: Normal rate, regular rhythm and normal heart sounds. Exam reveals no gallop and no friction rub.  No murmur heard. Pulmonary/Chest: Effort normal and breath sounds normal. No respiratory distress. She has no wheezes. She has no rales.  Abdominal: Soft. Bowel sounds are normal. She exhibits no mass. There is no tenderness.  Musculoskeletal: Normal range of motion. She exhibits no edema or deformity.  Lymphadenopathy:    She has no cervical adenopathy.  Neurological: She is alert and oriented to person, place, and time.  Skin: Skin is warm and dry. She is not diaphoretic. No pallor.  Psychiatric: She has a normal mood and affect. Her behavior is normal. Judgment and thought content normal.  Vitals reviewed.   Lab Results  Component Value Date   WBC 7.1 02/24/2018   HGB 13.4 02/24/2018   HCT 40.7 02/24/2018   PLT 326.0 02/24/2018  GLUCOSE 91 02/24/2018   CHOL 183 02/24/2018   TRIG 84.0 02/24/2018   HDL 52.00 02/24/2018   LDLCALC 115 (H) 02/24/2018   ALT 14 02/24/2018   AST 14 02/24/2018   NA 140 02/24/2018   K 3.9 02/24/2018   CL 105 02/24/2018   CREATININE 0.71 02/24/2018   BUN 11 02/24/2018   CO2 26 02/24/2018   TSH 0.47 02/24/2018   HGBA1C 5.4 02/24/2018    Mm Digital Screening Bilateral  Result Date: 12/28/2017 CLINICAL DATA:  Screening. EXAM: DIGITAL  SCREENING BILATERAL MAMMOGRAM WITH CAD COMPARISON:  Previous exam(s). ACR Breast Density Category a: The breast tissue is almost entirely fatty. FINDINGS: There are no findings suspicious for malignancy. Images were processed with CAD. IMPRESSION: No mammographic evidence of malignancy. A result letter of this screening mammogram will be mailed directly to the patient. RECOMMENDATION: Screening mammogram in one year. (Code:SM-B-01Y) BI-RADS CATEGORY  1: Negative. Electronically Signed   By: Franki Cabot M.D.   On: 12/28/2017 16:11    Assessment & Plan:   Corrissa was seen today for annual exam.  Diagnoses and all orders for this visit:  Hyperglycemia- Improvement noted.  Her A1c is down to 5.4%.  Medical therapy is not indicated -     Hemoglobin A1c; Future -     Comprehensive metabolic panel; Future  Routine general medical examination at a health care facility- Exam completed, labs reviewed, vaccines reviewed and updated, Pap/colonoscopy/mammogram are all up-to-date, patient education material was given. -     Lipid panel; Future  Essential hypertension-her blood pressure is well controlled on no medical therapy.  Electrolytes and renal function are normal.  I have asked her to continue working on her lifestyle modifications. -     CBC with Differential/Platelet; Future -     TSH; Future -     Urinalysis, Routine w reflex microscopic; Future -     Comprehensive metabolic panel; Future  Need for influenza vaccination -     Flu Vaccine QUAD 36+ mos IM  Gastroesophageal reflux disease without esophagitis-symptoms are well controlled with the H2 blocker.  She will undergo upper endoscopy soon.  Mild intermittent asthma without complication- She has had no recent flares and at this time is not interested in using an inhaler.   I am having Betty Fiorini "[name]" maintain her aspirin, cetirizine, and famotidine.  No orders of the defined types were placed in this  encounter.    Follow-up: Return in about 6 months (around 08/26/2018).  Betty Calico, MD

## 2018-02-24 NOTE — Patient Instructions (Signed)

## 2018-02-25 ENCOUNTER — Encounter: Payer: Self-pay | Admitting: Internal Medicine

## 2018-02-28 ENCOUNTER — Encounter: Payer: Self-pay | Admitting: Gastroenterology

## 2018-02-28 ENCOUNTER — Ambulatory Visit (AMBULATORY_SURGERY_CENTER): Payer: 59 | Admitting: Gastroenterology

## 2018-02-28 VITALS — BP 163/88 | HR 66 | Temp 99.3°F | Resp 20 | Ht 65.0 in | Wt 211.0 lb

## 2018-02-28 DIAGNOSIS — R1013 Epigastric pain: Secondary | ICD-10-CM

## 2018-02-28 DIAGNOSIS — K259 Gastric ulcer, unspecified as acute or chronic, without hemorrhage or perforation: Secondary | ICD-10-CM | POA: Diagnosis present

## 2018-02-28 MED ORDER — SODIUM CHLORIDE 0.9 % IV SOLN: 500.0000 mL | Freq: Once | INTRAVENOUS | Status: DC

## 2018-02-28 NOTE — Op Note (Signed)
[place] Patient Name: Betty Bell Procedure Date: 02/28/2018 10:00 AM MRN: [id number] Endoscopist: Thornton Park MD, MD Age: 57 Referring MD:  Date of Birth: [birth year] Gender: Female Account #: [id number] Procedure:                Upper GI endoscopy Indications:              Epigastric abdominal pain. History of GERD managed                            with ranitidine 300 mg QAM and 150 mg QPM. Having                            epigastric abdominal pain and worsening reflux. Did                            not tolerate Nexium. Medicines:                See the Anesthesia note for documentation of the                            administered medications Procedure:                Pre-Anesthesia Assessment:                           - Prior to the procedure, a History and Physical                            was performed, and patient medications and                            allergies were reviewed. The patient's tolerance of                            previous anesthesia was also reviewed. The risks                            and benefits of the procedure and the sedation                            options and risks were discussed with the patient.                            All questions were answered, and informed consent                            was obtained. Prior Anticoagulants: The patient has                            taken no previous anticoagulant or antiplatelet                            agents. ASA Grade Assessment: II - A patient with  mild systemic disease. After reviewing the risks                            and benefits, the patient was deemed in                            satisfactory condition to undergo the procedure.                           After obtaining informed consent, the endoscope was                            passed under direct vision. Throughout the                            procedure, the patient's  blood pressure, pulse, and                            oxygen saturations were monitored continuously. The                            Endoscope was introduced through the mouth, and                            advanced to the second part of duodenum. The upper                            GI endoscopy was accomplished without difficulty.                            The patient tolerated the procedure well. Scope In: Scope Out: Findings:                 The examined esophagus was normal.                           One non-bleeding cratered gastric ulcer with no                            stigmata of bleeding was found in the gastric                            antrum. The lesion was 5 mm in largest dimension.                           Multiple localized, non-bleeding erosions were                            found in the gastric antrum. There were no stigmata                            of recent bleeding. Biopsies were taken with a cold  forceps for histology.                           The examined duodenum was normal. Complications:            No immediate complications. Estimated Blood Loss:     Estimated blood loss: none. Impression:               - Normal esophagus.                           - Non-bleeding gastric ulcer with no stigmata of                            bleeding.                           - Non-bleeding erosive gastropathy. Biopsied.                           - Normal examined duodenum. Recommendation:           - Await pathology results.                           - Resume previous diet today.                           - Use Prilosec (omeprazole) 40 mg PO BID for 8                            weeks.                           - Avoid all NSAIDs                           - Return to GI office in 8 weeks. Thornton Park MD, MD 02/28/2018 10:29:04 AM This report has been signed electronically.

## 2018-02-28 NOTE — Progress Notes (Signed)
Called to room to assist during endoscopic procedure.  Patient ID and intended procedure confirmed with present staff. Received instructions for my participation in the procedure from the performing physician.  

## 2018-02-28 NOTE — Progress Notes (Signed)
Patient states she is allergic to Protonix, reportedly rapid heart rate, dizziness, and lightheadedness. Informed Dr. Tarri Glenn. Dr. Tarri Glenn states she will call patient regarding ordering PPI after Dr. Tarri Glenn consults with pharmacy. Patient verbalizes understanding.

## 2018-02-28 NOTE — Patient Instructions (Signed)
YOU HAD AN ENDOSCOPIC PROCEDURE TODAY AT [facility]:   Refer to the procedure report that was given to you for any specific questions about what was found during the examination.  If the procedure report does not answer your questions, please call your gastroenterologist to clarify.  If you requested that your care partner not be given the details of your procedure findings, then the procedure report has been included in a sealed envelope for you to review at your convenience later.  YOU SHOULD EXPECT: Some feelings of bloating in the abdomen. Passage of more gas than usual.  Walking can help get rid of the air that was put into your GI tract during the procedure and reduce the bloating. If you had a lower endoscopy (such as a colonoscopy or flexible sigmoidoscopy) you may notice spotting of blood in your stool or on the toilet paper. If you underwent a bowel prep for your procedure, you may not have a normal bowel movement for a few days.  Please Note:  You might notice some irritation and congestion in your nose or some drainage.  This is from the oxygen used during your procedure.  There is no need for concern and it should clear up in a day or so.  SYMPTOMS TO REPORT IMMEDIATELY:   Following upper endoscopy (EGD)  Vomiting of blood or coffee ground material  New chest pain or pain under the shoulder blades  Painful or persistently difficult swallowing  New shortness of breath  Fever of 100F or higher  Black, tarry-looking stools  For urgent or emergent issues, a gastroenterologist can be reached at any hour by calling [phone].   DIET:  We do recommend a small meal at first, but then you may proceed to your regular diet.  Drink plenty of fluids but you should avoid alcoholic beverages for 24 hours.  MEDICATIONS: Avoid all NSAIDs (non-steroidal anti-inflammatory drugs), such as Aspirin, Ibuprofen, Naproxen.   Please see handouts given to you by your recovery  nurse.  FOLLOW UP: Follow up with Dr. Tarri Glenn in her office in 8 weeks. Dr. Tarri Glenn will call you regarding starting a PPI.  ACTIVITY:  You should plan to take it easy for the rest of today and you should NOT DRIVE or use heavy machinery until tomorrow (because of the sedation medicines used during the test).    FOLLOW UP: Our staff will call the number listed on your records the next business day following your procedure to check on you and address any questions or concerns that you may have regarding the information given to you following your procedure. If we do not reach you, we will leave a message.  However, if you are feeling well and you are not experiencing any problems, there is no need to return our call.  We will assume that you have returned to your regular daily activities without incident.  If any biopsies were taken you will be contacted by phone or by letter within the next 1-3 weeks.  Please call us at [phone] if you have not heard about the biopsies in 3 weeks.   Thank you for allowing [place] to provide for your healthcare needs today.  SIGNATURES/CONFIDENTIALITY: You and/or your care partner have signed paperwork which will be entered into your electronic medical record.  These signatures attest to the fact that that the information above on your After Visit Summary has been reviewed and is understood.  Full responsibility of the confidentiality of this  discharge information lies with you and/or your care-partner. 

## 2018-02-28 NOTE — Progress Notes (Signed)
Report to PACU, RN, vss, BBS= Clear.  

## 2018-03-01 ENCOUNTER — Telehealth: Payer: Self-pay | Admitting: Gastroenterology

## 2018-03-01 ENCOUNTER — Telehealth: Payer: Self-pay | Admitting: *Deleted

## 2018-03-01 NOTE — Telephone Encounter (Signed)
Recommendations for the patient given her allergy to PPI:  Famotidine 40 mg BID x 8 weeks Carafate 1g QID x 2 weeks Avoid all NSAIDs  Thank you.

## 2018-03-01 NOTE — Telephone Encounter (Signed)
Thank you for the information. I will review with the pharmacist to determine a safe alternative. Thank you.

## 2018-03-01 NOTE — Telephone Encounter (Signed)
  Follow up Call-  Call back number 02/28/2018  Post procedure Call Back phone  # [phone]  Permission to leave phone message Yes  Some recent data might be hidden     Patient questions:  Do you have a fever, pain , or abdominal swelling? No. Pain Score  0 *  Have you tolerated food without any problems? Yes.    Have you been able to return to your normal activities? Yes.    Do you have any questions about your discharge instructions: Diet   No. Medications  No. Follow up visit  No.  Do you have questions or concerns about your Care?   yes  YES-  Pt stated she told d/c nurse yesterday that she cannot tolerate "the purple pill" because it has previously been prescribed and caused shortness of breath. Pt stated she was told that somebody would be calling back with information about an alternative but I did not see anything in notes.  Actions: * If pain score is 4 or above: No action needed, pain <4.

## 2018-03-02 ENCOUNTER — Other Ambulatory Visit: Payer: Self-pay

## 2018-03-02 ENCOUNTER — Telehealth: Payer: Self-pay | Admitting: Gastroenterology

## 2018-03-02 MED ORDER — SUCRALFATE 1 G PO TABS: 1.0000 g | ORAL_TABLET | Freq: Four times a day (QID) | ORAL | 0 refills | Status: DC

## 2018-03-02 MED ORDER — FAMOTIDINE 20 MG PO TABS: 40.0000 mg | ORAL_TABLET | Freq: Two times a day (BID) | ORAL | 3 refills | Status: DC

## 2018-03-02 NOTE — Telephone Encounter (Signed)
Pt said she is returning your call °

## 2018-03-02 NOTE — Telephone Encounter (Signed)
Tried to contact patient, no answer, unable to lvm.

## 2018-03-02 NOTE — Telephone Encounter (Signed)
Patient did not get her medication sent in, sent Rx to her requested pharmacy today. Instructed her on how to make a slurry with the carafate tablet, as the liquid is more expensive. Advised to stay away from NSAIDs.

## 2018-03-02 NOTE — Telephone Encounter (Signed)
Left message to call back. Patient seen by Alonza Bogus, PA

## 2018-03-02 NOTE — Telephone Encounter (Signed)
Christy not my patient. Send to [place] please.

## 2018-03-04 ENCOUNTER — Encounter: Payer: Self-pay | Admitting: Gastroenterology

## 2018-04-26 ENCOUNTER — Ambulatory Visit (INDEPENDENT_AMBULATORY_CARE_PROVIDER_SITE_OTHER): Payer: 59 | Admitting: Gastroenterology

## 2018-04-26 ENCOUNTER — Encounter: Payer: Self-pay | Admitting: Gastroenterology

## 2018-04-26 VITALS — BP 160/90 | HR 80 | Ht 65.0 in | Wt 210.0 lb

## 2018-04-26 DIAGNOSIS — R140 Abdominal distension (gaseous): Secondary | ICD-10-CM | POA: Diagnosis not present

## 2018-04-26 DIAGNOSIS — K257 Chronic gastric ulcer without hemorrhage or perforation: Secondary | ICD-10-CM | POA: Diagnosis not present

## 2018-04-26 DIAGNOSIS — Z86010 Personal history of colonic polyps: Secondary | ICD-10-CM

## 2018-04-26 DIAGNOSIS — Z800 Family history of malignant neoplasm of digestive organs: Secondary | ICD-10-CM

## 2018-04-26 DIAGNOSIS — R1084 Generalized abdominal pain: Secondary | ICD-10-CM

## 2018-04-26 MED ORDER — OMEPRAZOLE 40 MG PO CPDR: 40.0000 mg | DELAYED_RELEASE_CAPSULE | Freq: Every day | ORAL | 3 refills | Status: DC

## 2018-04-26 NOTE — Patient Instructions (Signed)
We have sent the following medications to your pharmacy for you to pick up at your convenience: Omeprazole 40 mg twice a day for 2 months  Avoid NSAIDs.   You will be due for a recall colonoscopy in 08/2018. We will send you a reminder in the mail when it gets closer to that time.

## 2018-04-26 NOTE — Progress Notes (Signed)
Referring Provider: Janith Lima, MD Primary Care Physician:  Janith Lima, MD   Chief complaint:  Upper abdominal pain   IMPRESSION:  H pylori negative gastric ulcer Abdominal pain and bloating - likely related to gastric ulcer History of pruritus ani History of colon polyp    - normal colonoscopy 2009    - tubular adenoma resected from the ascending colon 08/2013    - surveillance due 08/2018 Family history of colon cancer (father and brother) Family history of colon polyps (sister)  Recently diagnosed gastric ulcer that has been incompletely treated. Will plan 8 weeks of PPI BID now. No surveillance endoscopy necessary if symptoms resolve with treatment.   PLAN: Omeprazole 40 mg BID x 8 weeks Avoid all NSAIDs Surveillance colonoscopy due 08/2018 Return to this clinic with any additional questions or concerns   HPI: Betty Bell is a 57 y.o. female returns in scheduled follow-up.  She was initially seen in consultation by PA Zehr 02/03/18 for epigastric abdominal pain, reflux, and bloating.  At that time she was not responding to Zantac 300 mg in the morning and 500 mg in the evening. This is my first office visit with Betty Bell. I have reviewed her electronic health record to create the problem list above.  EGD 02/28/2018 revealed a 5 mm antral ulcer and multiple erosions.  Biopsies were negative for H. Pylori.  I recommended omeprazole 40 mg twice daily for 8 weeks at the time of the procedure but she was not aware of this recommendation.  She did not tolerate carafate.  She did not try the samples of FDguard as provided by Dr. Myrtice Lauth. She has continued on Pepcid 20 mg BID.  She continues to have diffuse abdominal pain/tightness in the upper abdomen and bloating. Symptoms are unchanged from the time of consultation.    Past Medical History:  Diagnosis Date  . Anxiety   . Asthma   . Cervical dysplasia    High Grade  . CIN I (cervical intraepithelial neoplasia I)    . Colon polyp 2015   TUBULAR ADENOMA  . Endometrial polyp   . GERD (gastroesophageal reflux disease)   . Hydrosalpinx    Left and Right  . IBS (irritable bowel syndrome)   . Rectal burning   . Seasonal allergies   . Uterine fibroid     Past Surgical History:  Procedure Laterality Date  . CARPAL TUNNEL RELEASE Left    x 2  . COLONOSCOPY  2015  . DILATION AND CURETTAGE OF UTERUS    . HYSTEROSCOPY    . LASER ABLATION OF THE CERVIX    . PELVIC LAPAROSCOPY     DL  . UTERINE FIBROID EMBOLIZATION    . wrist cystectomy Right     Current Outpatient Medications  Medication Sig Dispense Refill  . cetirizine (ZYRTEC) 10 MG tablet Take 10 mg by mouth daily.    . famotidine (PEPCID) 20 MG tablet Take 2 tablets (40 mg total) by mouth 2 (two) times daily. 60 tablet 3  . sucralfate (CARAFATE) 1 g tablet Take 1 tablet (1 g total) by mouth 4 (four) times daily. Dissolve one tablet in 4 oz of water before taking to make a slurry. 56 tablet 0   Current Facility-Administered Medications  Medication Dose Route Frequency Provider Last Rate Last Dose  . 0.9 %  sodium chloride infusion  500 mL Intravenous Once [place], MD        Allergies as of 04/26/2018 - Review  Complete 04/26/2018  Allergen Reaction Noted  . Aspirin    . Cephalosporins  06/08/2011  . Ciprofloxacin  06/08/2011  . Fish allergy  08/04/2013  . Nitrofurantoin monohyd macro  06/08/2011  . Penicillins  06/08/2011  . Protonix [pantoprazole sodium]  02/28/2014  . Sulfa antibiotics  06/08/2011    Family History  Problem Relation Age of Onset  . Diabetes Mother   . Hypertension Mother   . Diabetes Father   . Hypertension Father   . Prostate cancer Father   . Colon cancer Father 12  . Colon cancer Brother 42  . Cancer Brother        esophagus, skin  . Colon polyps Sister   . Hypertension Sister   . Lung cancer Brother   . Cancer - Other Brother        esophagal  . Kidney disease Other   . Irritable bowel  syndrome Sister   . COPD Neg Hx   . Alcohol abuse Neg Hx   . Depression Neg Hx   . Drug abuse Neg Hx   . Early death Neg Hx   . Hearing loss Neg Hx   . Heart disease Neg Hx   . Hyperlipidemia Neg Hx   . Stroke Neg Hx     Social History   Socioeconomic History  . Marital status: Divorced    Spouse name: Not on file  . Number of children: 1  . Years of education: Not on file  . Highest education level: Not on file  Occupational History  . Occupation: Environmental education officer: [employer]  . Financial resource strain: Not on file  . Food insecurity:    Worry: Not on file    Inability: Not on file  . Transportation needs:    Medical: Not on file    Non-medical: Not on file  Tobacco Use  . Smoking status: Never Smoker  . Smokeless tobacco: Never Used  . Tobacco comment: Regular Exercise - Yes  Substance and Sexual Activity  . Alcohol use: No    Alcohol/week: 0.0 standard drinks    Comment: rare  . Drug use: No  . Sexual activity: Yes    Birth control/protection: Condom    Comment: 1st intercourse- 16, partners - 3  Lifestyle  . Physical activity:    Days per week: Not on file    Minutes per session: Not on file  . Stress: Not on file  Relationships  . Social connections:    Talks on phone: Not on file    Gets together: Not on file    Attends religious service: Not on file    Active member of club or organization: Not on file    Attends meetings of clubs or organizations: Not on file    Relationship status: Not on file  . Intimate partner violence:    Fear of current or ex partner: Not on file    Emotionally abused: Not on file    Physically abused: Not on file    Forced sexual activity: Not on file  Other Topics Concern  . Not on file  Social History Narrative  . Not on file    Filed Weights   04/26/18 1326  Weight: 210 lb (95.3 kg)    Physical Exam: Vital signs were reviewed. General:   Alert, well-nourished, pleasant and  cooperative in NAD Lungs:  Clear throughout to auscultation.   No wheezes. Heart:  Regular rate and rhythm; no  murmurs Abdomen:  Soft, central obesity, nontender, normal bowel sounds. No rebound or guarding. No hepatosplenomegaly Msk:  Symmetrical without gross deformities. Extremities:  No gross deformities or edema. Neurologic:  Alert and  oriented x4;  grossly nonfocal Psych:  Alert and cooperative. Normal mood and affect.   Omarion Minnehan L. Tarri Glenn, MD, MPH [facility] 04/26/2018, 1:32 PM

## 2018-05-02 ENCOUNTER — Encounter: Payer: Self-pay | Admitting: *Deleted

## 2018-05-02 NOTE — Telephone Encounter (Signed)
Opened in error

## 2018-08-03 ENCOUNTER — Encounter: Payer: Self-pay | Admitting: Internal Medicine

## 2018-08-03 ENCOUNTER — Ambulatory Visit (INDEPENDENT_AMBULATORY_CARE_PROVIDER_SITE_OTHER): Payer: No Typology Code available for payment source | Admitting: Internal Medicine

## 2018-08-03 ENCOUNTER — Other Ambulatory Visit: Payer: Self-pay

## 2018-08-03 VITALS — BP 160/90 | HR 86 | Temp 98.6°F | Resp 16 | Ht 65.0 in | Wt 210.2 lb

## 2018-08-03 DIAGNOSIS — I10 Essential (primary) hypertension: Secondary | ICD-10-CM

## 2018-08-03 DIAGNOSIS — J301 Allergic rhinitis due to pollen: Secondary | ICD-10-CM | POA: Diagnosis not present

## 2018-08-03 MED ORDER — TRIAMCINOLONE ACETONIDE 55 MCG/ACT NA AERO: 4.0000 | INHALATION_SPRAY | Freq: Every day | NASAL | 1 refills | Status: DC

## 2018-08-03 MED ORDER — METHYLPREDNISOLONE ACETATE 80 MG/ML IJ SUSP
120.0000 mg | Freq: Once | INTRAMUSCULAR | Status: AC
  Administered 2018-08-03: 120 mg via INTRAMUSCULAR

## 2018-08-03 MED ORDER — TRIAMTERENE-HCTZ 37.5-25 MG PO CAPS: 1.0000 | ORAL_CAPSULE | Freq: Every day | ORAL | 0 refills | Status: DC

## 2018-08-03 NOTE — Patient Instructions (Signed)

## 2018-08-03 NOTE — Progress Notes (Signed)
Subjective:  Patient ID: Betty Bell, female    DOB: 30-Sep-1960  Age: 58 y.o. MRN: [id number]  CC: Hypertension and Allergic Rhinitis    HPI Betty Bell presents for a one-week history of sneezing, nasal congestion, runny nose, postnasal drip, and nonproductive cough.  She has tried to control the symptoms with Zyrtec and Afrin nasal spray.  She is not taking an antihypertensive and does not monitor her blood pressure.  She has had a few headaches recently but she denies blurred vision, CP, or DOE.  Outpatient Medications Prior to Visit  Medication Sig Dispense Refill  . cetirizine (ZYRTEC) 10 MG tablet Take 10 mg by mouth daily.    . famotidine (PEPCID) 20 MG tablet Take 2 tablets (40 mg total) by mouth 2 (two) times daily. 60 tablet 3  . omeprazole (PRILOSEC) 40 MG capsule Take 1 capsule (40 mg total) by mouth daily. 90 capsule 3  . sucralfate (CARAFATE) 1 g tablet Take 1 tablet (1 g total) by mouth 4 (four) times daily. Dissolve one tablet in 4 oz of water before taking to make a slurry. 56 tablet 0  . 0.9 %  sodium chloride infusion      No facility-administered medications prior to visit.     ROS Review of Systems  Constitutional: Negative for chills, diaphoresis, fatigue and fever.  HENT: Positive for congestion, postnasal drip, rhinorrhea and sneezing. Negative for facial swelling, sinus pressure, sinus pain and sore throat.   Eyes: Negative.   Respiratory: Positive for cough. Negative for chest tightness, shortness of breath and wheezing.   Cardiovascular: Negative for chest pain, palpitations and leg swelling.  Gastrointestinal: Negative for abdominal pain, constipation, diarrhea, nausea and vomiting.  Endocrine: Negative.   Genitourinary: Negative.   Musculoskeletal: Negative.  Negative for arthralgias and myalgias.  Skin: Negative.  Negative for rash.  Neurological: Positive for headaches. Negative for dizziness, weakness and light-headedness.  Hematological:  Negative for adenopathy. Does not bruise/bleed easily.  Psychiatric/Behavioral: Negative.     Objective:  BP (!) 160/90 (BP Location: Left Arm, Patient Position: Sitting, Cuff Size: Large)   Pulse 86   Temp 98.6 F (37 C) (Oral)   Resp 16   Ht 5\' 5"  (1.651 m)   Wt 210 lb 4 oz (95.4 kg)   LMP 03/26/2011   SpO2 97%   BMI 34.99 kg/m   BP Readings from Last 3 Encounters:  08/03/18 (!) 160/90  04/26/18 (!) 160/90  02/28/18 (!) 163/88    Wt Readings from Last 3 Encounters:  08/03/18 210 lb 4 oz (95.4 kg)  04/26/18 210 lb (95.3 kg)  02/28/18 211 lb (95.7 kg)    Physical Exam Vitals signs reviewed.  Constitutional:      General: She is not in acute distress.    Appearance: She is not ill-appearing, toxic-appearing or diaphoretic.  HENT:     Right Ear: Tympanic membrane normal.     Left Ear: Tympanic membrane normal.     Nose: Mucosal edema, congestion and rhinorrhea present.     Right Nostril: No epistaxis or occlusion.     Left Nostril: No epistaxis.     Right Sinus: No maxillary sinus tenderness.     Left Sinus: No maxillary sinus tenderness.     Mouth/Throat:     Mouth: Mucous membranes are moist.     Pharynx: Oropharynx is clear. No oropharyngeal exudate or posterior oropharyngeal erythema.  Eyes:     General: No scleral icterus.    Conjunctiva/sclera: Conjunctivae normal.  Neck:     Musculoskeletal: Normal range of motion. No muscular tenderness.  Cardiovascular:     Rate and Rhythm: Normal rate and regular rhythm.     Heart sounds: No murmur. No gallop.   Pulmonary:     Breath sounds: Normal breath sounds. No stridor. No wheezing, rhonchi or rales.  Abdominal:     General: Bowel sounds are normal.     Palpations: There is no hepatomegaly, splenomegaly or mass.     Tenderness: There is no abdominal tenderness. There is no guarding.  Musculoskeletal:     Right lower leg: No edema.     Left lower leg: No edema.  Lymphadenopathy:     Cervical: No cervical  adenopathy.  Skin:    General: Skin is warm.     Coloration: Skin is not pale.     Findings: No rash.  Neurological:     General: No focal deficit present.     Lab Results  Component Value Date   WBC 7.1 02/24/2018   HGB 13.4 02/24/2018   HCT 40.7 02/24/2018   PLT 326.0 02/24/2018   GLUCOSE 91 02/24/2018   CHOL 183 02/24/2018   TRIG 84.0 02/24/2018   HDL 52.00 02/24/2018   LDLCALC 115 (H) 02/24/2018   ALT 14 02/24/2018   AST 14 02/24/2018   NA 140 02/24/2018   K 3.9 02/24/2018   CL 105 02/24/2018   CREATININE 0.71 02/24/2018   BUN 11 02/24/2018   CO2 26 02/24/2018   TSH 0.47 02/24/2018   HGBA1C 5.4 02/24/2018    Mm Digital Screening Bilateral  Result Date: 12/28/2017 CLINICAL DATA:  Screening. EXAM: DIGITAL SCREENING BILATERAL MAMMOGRAM WITH CAD COMPARISON:  Previous exam(s). ACR Breast Density Category a: The breast tissue is almost entirely fatty. FINDINGS: There are no findings suspicious for malignancy. Images were processed with CAD. IMPRESSION: No mammographic evidence of malignancy. A result letter of this screening mammogram will be mailed directly to the patient. RECOMMENDATION: Screening mammogram in one year. (Code:SM-B-01Y) BI-RADS CATEGORY  1: Negative. Electronically Signed   By: Franki Cabot M.D.   On: 12/28/2017 16:11    Assessment & Plan:   Tyreonna was seen today for hypertension and allergic rhinitis .  Diagnoses and all orders for this visit:  Essential hypertension- Her blood pressure is not adequately well controlled and she is symptomatic.  I have asked her to take the combination of a potassium sparing diuretic and the thiazide diuretic. -     triamterene-hydrochlorothiazide (DYAZIDE) 37.5-25 MG capsule; Take 1 each (1 capsule total) by mouth daily.  Seasonal allergic rhinitis due to pollen -     triamcinolone (NASACORT) 55 MCG/ACT AERO nasal inhaler; Place 4 sprays into the nose daily. -     methylPREDNISolone acetate (DEPO-MEDROL) injection  120 mg   I am having Betty Mire "[name]" start on triamterene-hydrochlorothiazide and triamcinolone. I am also having her maintain her cetirizine, sucralfate, famotidine, and omeprazole. We will stop administering sodium chloride. Additionally, we administered methylPREDNISolone acetate.  Meds ordered this encounter  Medications  . triamterene-hydrochlorothiazide (DYAZIDE) 37.5-25 MG capsule    Sig: Take 1 each (1 capsule total) by mouth daily.    Dispense:  90 capsule    Refill:  0  . triamcinolone (NASACORT) 55 MCG/ACT AERO nasal inhaler    Sig: Place 4 sprays into the nose daily.    Dispense:  3 Inhaler    Refill:  1  . methylPREDNISolone acetate (DEPO-MEDROL) injection 120 mg  Follow-up: Return in about 2 months (around 10/03/2018).  Scarlette Calico, MD

## 2018-08-10 ENCOUNTER — Telehealth: Payer: Self-pay | Admitting: Internal Medicine

## 2018-08-10 ENCOUNTER — Other Ambulatory Visit: Payer: Self-pay | Admitting: Internal Medicine

## 2018-08-10 DIAGNOSIS — J4520 Mild intermittent asthma, uncomplicated: Secondary | ICD-10-CM

## 2018-08-10 DIAGNOSIS — J301 Allergic rhinitis due to pollen: Secondary | ICD-10-CM

## 2018-08-10 MED ORDER — HYDROCODONE-HOMATROPINE 5-1.5 MG/5ML PO SYRP: 5.0000 mL | ORAL_SOLUTION | Freq: Four times a day (QID) | ORAL | 0 refills | Status: AC | PRN

## 2018-08-10 MED ORDER — HYDROCODONE-HOMATROPINE 5-1.5 MG/5ML PO SYRP: 5.0000 mL | ORAL_SOLUTION | Freq: Four times a day (QID) | ORAL | 0 refills | Status: DC | PRN

## 2018-08-10 NOTE — Telephone Encounter (Signed)
Pt informed rx has been sent.  

## 2018-08-10 NOTE — Telephone Encounter (Signed)
RX sent

## 2018-08-10 NOTE — Telephone Encounter (Signed)
Copied from [place] [id number]. Topic: Quick Communication - Rx Refill/Question >> Aug 10, 2018  1:49 PM Selinda Flavin B, NT wrote: **patient calling to request a refill due to cough with allergies. Was seen on 08/03/2018**  Medication: HYDROcodone-homatropine (HYCODAN) 5-1.5 MG/5ML syrup   Has the patient contacted their pharmacy? yes (Agent: If no, request that the patient contact the pharmacy for the refill.) (Agent: If yes, when and what did the pharmacy advise?)  Preferred Pharmacy (with phone number or street name): [id number] - [place], [place]: Please be advised that RX refills may take up to 3 business days. We ask that you follow-up with your pharmacy.

## 2018-08-10 NOTE — Telephone Encounter (Signed)
Pt is requesting Hycodan. Pt is coughing due to allergies. LOV was 08/03/2018.   Please advise

## 2018-08-24 ENCOUNTER — Telehealth: Payer: Self-pay | Admitting: Internal Medicine

## 2018-08-24 NOTE — Telephone Encounter (Signed)
Patient called and stated that she needs the office to call her back because the blood pressure medication prescribed is making her sick. Please call patient at [phone] (mobile)

## 2018-08-24 NOTE — Telephone Encounter (Signed)
Pt called to return call made to her house phone about Blood pressure medication. Pt states she would like a call sent to her mobile number [phone]. Pt states the medication she was taking before that did not make her sick was Bystolic.

## 2018-08-24 NOTE — Telephone Encounter (Signed)
Called pt.   Mail box is full and unable to leave a message.

## 2018-08-25 ENCOUNTER — Other Ambulatory Visit: Payer: Self-pay | Admitting: Internal Medicine

## 2018-08-25 DIAGNOSIS — I10 Essential (primary) hypertension: Secondary | ICD-10-CM

## 2018-08-25 MED ORDER — INDAPAMIDE 2.5 MG PO TABS: 2.5000 mg | ORAL_TABLET | Freq: Every day | ORAL | 1 refills | Status: DC

## 2018-08-25 NOTE — Telephone Encounter (Signed)
Pt contacted and stated that the current medication (triamterence-HCTZ) is irritating her stomach when she takes it. Sx include stomach pain and cramping, nausea and dizziness. Please advise if there is an alternative.

## 2018-08-25 NOTE — Telephone Encounter (Signed)
Pt informed new medication has been sent.

## 2018-08-25 NOTE — Telephone Encounter (Signed)
New RX sent

## 2018-09-06 ENCOUNTER — Ambulatory Visit: Payer: Self-pay | Admitting: Internal Medicine

## 2018-09-06 NOTE — Telephone Encounter (Signed)
Ask her to check her BP

## 2018-09-06 NOTE — Telephone Encounter (Signed)
Started Lozol on 08/25/18 and almost immediately started having cramping in her feet and in her side. Stopped the medication last week. Has not checked her BP. States she was on a medication 2 years "that seemed to work ok." Spoke with [place] in the practice and will forward triage note for review.  Answer Assessment - Initial Assessment Questions 1. SYMPTOMS: "Do you have any symptoms?"     Yes - Cramping to both feet and at her side at the belt line 2. SEVERITY: If symptoms are present, ask "Are they mild, moderate or severe?"     Severe.  Protocols used: MEDICATION QUESTION CALL-A-AH

## 2018-09-06 NOTE — Telephone Encounter (Signed)
Tried contacting pt, unable to LVM 

## 2018-09-16 ENCOUNTER — Encounter: Payer: Self-pay | Admitting: Gastroenterology

## 2018-09-21 ENCOUNTER — Ambulatory Visit (INDEPENDENT_AMBULATORY_CARE_PROVIDER_SITE_OTHER): Payer: No Typology Code available for payment source | Admitting: Internal Medicine

## 2018-09-21 ENCOUNTER — Encounter: Payer: Self-pay | Admitting: Internal Medicine

## 2018-09-21 ENCOUNTER — Other Ambulatory Visit: Payer: Self-pay

## 2018-09-21 ENCOUNTER — Other Ambulatory Visit (INDEPENDENT_AMBULATORY_CARE_PROVIDER_SITE_OTHER): Payer: No Typology Code available for payment source

## 2018-09-21 VITALS — BP 136/78 | HR 74 | Temp 98.0°F | Resp 16 | Ht 65.0 in | Wt 206.8 lb

## 2018-09-21 DIAGNOSIS — I10 Essential (primary) hypertension: Secondary | ICD-10-CM

## 2018-09-21 DIAGNOSIS — R252 Cramp and spasm: Secondary | ICD-10-CM

## 2018-09-21 LAB — COMPREHENSIVE METABOLIC PANEL
ALT: 26 U/L (ref 0–35)
AST: 25 U/L (ref 0–37)
Albumin: 4.5 g/dL (ref 3.5–5.2)
Alkaline Phosphatase: 112 U/L (ref 39–117)
BUN: 13 mg/dL (ref 6–23)
CO2: 28 mEq/L (ref 19–32)
Calcium: 9.5 mg/dL (ref 8.4–10.5)
Chloride: 100 mEq/L (ref 96–112)
Creatinine, Ser: 0.64 mg/dL (ref 0.40–1.20)
GFR: 115.13 mL/min (ref >60.00)
Glucose, Bld: 103 mg/dL — ABNORMAL HIGH (ref 70–99)
Potassium: 4 mEq/L (ref 3.5–5.1)
Sodium: 137 mEq/L (ref 135–145)
Total Bilirubin: 0.4 mg/dL (ref 0.2–1.2)
Total Protein: 7.9 g/dL (ref 6.0–8.3)

## 2018-09-21 LAB — MAGNESIUM: Magnesium: 2.1 mg/dL (ref 1.5–2.5)

## 2018-09-21 MED ORDER — NEBIVOLOL HCL 5 MG PO TABS: 5.0000 mg | ORAL_TABLET | Freq: Every day | ORAL | 1 refills | Status: DC

## 2018-09-21 NOTE — Patient Instructions (Addendum)
  Tests ordered today. Your results will be released to [facility] (or called to you) after review, usually within 72hours after test completion. If any changes need to be made, you will be notified at that same time.   Medications reviewed and updated.  Changes include :   Stop the indapamide and start bystolic  ( samples given )  Your prescription(s) have been submitted to your pharmacy. Please take as directed and contact our office if you believe you are having problem(s) with the medication(s).

## 2018-09-21 NOTE — Assessment & Plan Note (Signed)
BP Readings from Last 3 Encounters:  09/21/18 136/78  08/03/18 (!) 160/90  04/26/18 (!) 160/90   Blood pressure fairly controlled today with indapamide, but she is having side effects and we will discontinue She has been on Bystolic in the past and states she tolerated that-samples of Bystolic 5 mg given and she will take daily Prescription sent to pharmacy to see if it is covered by her insurance CMP, magnesium level She will continue to monitor her blood pressure at home

## 2018-09-21 NOTE — Assessment & Plan Note (Signed)
Muscle cramping likely related to indapamide Will check magnesium, CMP She is drinking plenty of water Discussed that if her potassium is low we could add potassium or change medication and she prefers to change medication Discontinue indapamide Start Bystolic 5 mg daily-she has been on this in the past and states she tolerated it well-samples given and I will send a prescription to her pharmacy to make sure it is covered

## 2018-09-21 NOTE — Progress Notes (Signed)
Subjective:    Patient ID: Betty Bell, female    DOB: [birth year], 58 y.o.   MRN: [id number]  HPI The patient is here for an acute visit.   Cramping in her legs, feet and side of abdomen:  She feels this is a medication side effect from her BP medicaiton.  The cramping occurs at night and it has occurred with the past few medications she has been on for her blood pressure.  She drinks plenty of water daily so she does not think it is dehydration.  She was wondering if her potassium was low.      Medications and allergies reviewed with patient and updated if appropriate.  Patient Active Problem List   Diagnosis Date Noted  . Hyperglycemia 02/24/2018  . Lung nodule, solitary 05/28/2017  . Mild intermittent asthma without complication 53/61/4431  . Obesity (BMI 35.0-39.9 without comorbidity) 12/13/2014  . Essential hypertension 07/13/2014  . Intramural leiomyoma of uterus 06/19/2014  . Other screening mammogram 11/16/2013  . Allergic rhinosinusitis   . Routine general medical examination at a health care facility 03/30/2011  . GERD 01/26/2008    Current Outpatient Medications on File Prior to Visit  Medication Sig Dispense Refill  . cetirizine (ZYRTEC) 10 MG tablet Take 10 mg by mouth daily.    . famotidine (PEPCID) 20 MG tablet Take 2 tablets (40 mg total) by mouth 2 (two) times daily. 60 tablet 3  . indapamide (LOZOL) 2.5 MG tablet Take 1 tablet (2.5 mg total) by mouth daily. 90 tablet 1   No current facility-administered medications on file prior to visit.     Past Medical History:  Diagnosis Date  . Anxiety   . Asthma   . Cervical dysplasia    High Grade  . CIN I (cervical intraepithelial neoplasia I)   . Colon polyp 2015   TUBULAR ADENOMA  . Endometrial polyp   . GERD (gastroesophageal reflux disease)   . Hydrosalpinx    Left and Right  . IBS (irritable bowel syndrome)   . Rectal burning   . Seasonal allergies   . Uterine fibroid     Past Surgical  History:  Procedure Laterality Date  . CARPAL TUNNEL RELEASE Left    x 2  . COLONOSCOPY  2015  . DILATION AND CURETTAGE OF UTERUS    . HYSTEROSCOPY    . LASER ABLATION OF THE CERVIX    . PELVIC LAPAROSCOPY     DL  . UTERINE FIBROID EMBOLIZATION    . wrist cystectomy Right     Social History   Socioeconomic History  . Marital status: Divorced    Spouse name: Not on file  . Number of children: 1  . Years of education: Not on file  . Highest education level: Not on file  Occupational History  . Occupation: Environmental education officer: [employer]  . Financial resource strain: Not on file  . Food insecurity:    Worry: Not on file    Inability: Not on file  . Transportation needs:    Medical: Not on file    Non-medical: Not on file  Tobacco Use  . Smoking status: Never Smoker  . Smokeless tobacco: Never Used  . Tobacco comment: Regular Exercise - Yes  Substance and Sexual Activity  . Alcohol use: No    Alcohol/week: 0.0 standard drinks    Comment: rare  . Drug use: No  . Sexual activity: Yes    Birth  control/protection: Condom    Comment: 1st intercourse- 16, partners - 3  Lifestyle  . Physical activity:    Days per week: Not on file    Minutes per session: Not on file  . Stress: Not on file  Relationships  . Social connections:    Talks on phone: Not on file    Gets together: Not on file    Attends religious service: Not on file    Active member of club or organization: Not on file    Attends meetings of clubs or organizations: Not on file    Relationship status: Not on file  Other Topics Concern  . Not on file  Social History Narrative  . Not on file    Family History  Problem Relation Age of Onset  . Diabetes Mother   . Hypertension Mother   . Diabetes Father   . Hypertension Father   . Prostate cancer Father   . Colon cancer Father 65  . Colon cancer Brother 45  . Cancer Brother        esophagus, skin  . Colon polyps  Sister   . Hypertension Sister   . Lung cancer Brother   . Cancer - Other Brother        esophagal  . Kidney disease Other   . Irritable bowel syndrome Sister   . COPD Neg Hx   . Alcohol abuse Neg Hx   . Depression Neg Hx   . Drug abuse Neg Hx   . Early death Neg Hx   . Hearing loss Neg Hx   . Heart disease Neg Hx   . Hyperlipidemia Neg Hx   . Stroke Neg Hx     Review of Systems  Constitutional: Negative for fever.  Respiratory: Negative for cough, shortness of breath and wheezing.   Cardiovascular: Negative for chest pain, palpitations and leg swelling.  Neurological: Positive for light-headedness (Chronic, not new). Negative for headaches.       Objective:   Vitals:   09/21/18 1533  BP: 136/78  Pulse: 74  Resp: 16  Temp: 98 F (36.7 C)  SpO2: 98%   BP Readings from Last 3 Encounters:  09/21/18 136/78  08/03/18 (!) 160/90  04/26/18 (!) 160/90   Wt Readings from Last 3 Encounters:  09/21/18 206 lb 12.8 oz (93.8 kg)  08/03/18 210 lb 4 oz (95.4 kg)  04/26/18 210 lb (95.3 kg)   Body mass index is 34.41 kg/m.   Physical Exam    Constitutional: Appears well-developed and well-nourished. No distress.  HENT:  Head: Normocephalic and atraumatic.  Neck: Neck supple. No tracheal deviation present. No thyromegaly present.  No cervical lymphadenopathy Cardiovascular: Normal rate, regular rhythm and normal heart sounds.   No murmur heard. No carotid bruit .  No edema Pulmonary/Chest: Effort normal and breath sounds normal. No respiratory distress. No has no wheezes. No rales.  Skin: Skin is warm and dry. Not diaphoretic.  Psychiatric: Normal mood and affect. Behavior is normal.       Assessment & Plan:    See Problem List for Assessment and Plan of chronic medical problems.

## 2018-10-24 ENCOUNTER — Encounter: Payer: Self-pay | Admitting: Family

## 2018-10-24 ENCOUNTER — Other Ambulatory Visit: Payer: Self-pay

## 2018-10-24 ENCOUNTER — Ambulatory Visit (INDEPENDENT_AMBULATORY_CARE_PROVIDER_SITE_OTHER): Payer: No Typology Code available for payment source | Admitting: Family

## 2018-10-24 DIAGNOSIS — N390 Urinary tract infection, site not specified: Secondary | ICD-10-CM | POA: Diagnosis not present

## 2018-10-24 MED ORDER — AZITHROMYCIN 250 MG PO TABS: ORAL_TABLET | ORAL | 0 refills | Status: DC

## 2018-10-24 NOTE — Progress Notes (Signed)
Betty Bell is a 58 y.o. female with the following history as recorded in [employer]:  Patient Active Problem List   Diagnosis Date Noted  . Muscle cramping 09/21/2018  . Hyperglycemia 02/24/2018  . Lung nodule, solitary 05/28/2017  . Mild intermittent asthma without complication 02/40/9735  . Obesity (BMI 35.0-39.9 without comorbidity) 12/13/2014  . Essential hypertension 07/13/2014  . Intramural leiomyoma of uterus 06/19/2014  . Other screening mammogram 11/16/2013  . Allergic rhinosinusitis   . Routine general medical examination at a health care facility 03/30/2011  . GERD 01/26/2008    Current Outpatient Medications  Medication Sig Dispense Refill  . azithromycin (ZITHROMAX) 250 MG tablet 2 tabs po qd x 1 day; 1 tablet per day x 4 days; 6 tablet 0  . cetirizine (ZYRTEC) 10 MG tablet Take 10 mg by mouth daily.    . famotidine (PEPCID) 20 MG tablet Take 2 tablets (40 mg total) by mouth 2 (two) times daily. 60 tablet 3  . nebivolol (BYSTOLIC) 5 MG tablet Take 1 tablet (5 mg total) by mouth daily. 30 tablet 1   No current facility-administered medications for this visit.     Allergies: Aspirin, Cephalosporins, Ciprofloxacin, Fish allergy, Nitrofurantoin monohyd macro, Penicillins, Protonix [pantoprazole sodium], and Sulfa antibiotics  Past Medical History:  Diagnosis Date  . Anxiety   . Asthma   . Cervical dysplasia    High Grade  . CIN I (cervical intraepithelial neoplasia I)   . Colon polyp 2015   TUBULAR ADENOMA  . Endometrial polyp   . GERD (gastroesophageal reflux disease)   . Hydrosalpinx    Left and Right  . IBS (irritable bowel syndrome)   . Rectal burning   . Seasonal allergies   . Uterine fibroid     Past Surgical History:  Procedure Laterality Date  . CARPAL TUNNEL RELEASE Left    x 2  . COLONOSCOPY  2015  . DILATION AND CURETTAGE OF UTERUS    . HYSTEROSCOPY    . LASER ABLATION OF THE CERVIX    . PELVIC LAPAROSCOPY     DL  . UTERINE FIBROID  EMBOLIZATION    . wrist cystectomy Right     Family History  Problem Relation Age of Onset  . Diabetes Mother   . Hypertension Mother   . Diabetes Father   . Hypertension Father   . Prostate cancer Father   . Colon cancer Father 26  . Colon cancer Brother 24  . Cancer Brother        esophagus, skin  . Colon polyps Sister   . Hypertension Sister   . Lung cancer Brother   . Cancer - Other Brother        esophagal  . Kidney disease Other   . Irritable bowel syndrome Sister   . COPD Neg Hx   . Alcohol abuse Neg Hx   . Depression Neg Hx   . Drug abuse Neg Hx   . Early death Neg Hx   . Hearing loss Neg Hx   . Heart disease Neg Hx   . Hyperlipidemia Neg Hx   . Stroke Neg Hx     Social History   Tobacco Use  . Smoking status: Never Smoker  . Smokeless tobacco: Never Used  . Tobacco comment: Regular Exercise - Yes  Substance Use Topics  . Alcohol use: No    Alcohol/week: 0.0 standard drinks    Comment: rare    Subjective:     I connected with [name]  on 10/24/18 at  4:00 PM EDT by a video enabled telemedicine application and verified that I am speaking with the correct person using two identifiers. Patient and I are the only 2 people on the video call.   I discussed the limitations of evaluation and management by telemedicine and the availability of in person appointments. The patient expressed understanding and agreed to proceed.   3 day history of burning and frequency; no fever or blood in urine; has been prone to UTIs in the past; numerous antibiotic allergies- notes she can only take a Z-pak/ has used successfully in the past;   Objective:  There were no vitals filed for this visit.  General: Well developed, well nourished, in no acute distress  Skin : Warm and dry.  Head: Normocephalic and atraumatic  Lungs: Respirations unlabored;  Neurologic: Alert and oriented; speech intact; face symmetrical;   Assessment:  1. Urinary tract infection without  hematuria, site unspecified     Plan:  Will treat based on self-reported symptoms; due to extensive allergies, requesting to be given Z-pak; if symptoms persist, she will need urine culture; increase fluids and follow-up worse, no better.   No follow-ups on file.  No orders of the defined types were placed in this encounter.   Requested Prescriptions   Signed Prescriptions Disp Refills  . azithromycin (ZITHROMAX) 250 MG tablet 6 tablet 0    Sig: 2 tabs po qd x 1 day; 1 tablet per day x 4 days;

## 2018-11-19 ENCOUNTER — Other Ambulatory Visit: Payer: Self-pay | Admitting: Internal Medicine

## 2018-11-19 DIAGNOSIS — I10 Essential (primary) hypertension: Secondary | ICD-10-CM

## 2019-01-18 ENCOUNTER — Encounter: Payer: Self-pay | Admitting: Internal Medicine

## 2019-01-18 ENCOUNTER — Ambulatory Visit (INDEPENDENT_AMBULATORY_CARE_PROVIDER_SITE_OTHER): Payer: No Typology Code available for payment source | Admitting: Internal Medicine

## 2019-01-18 ENCOUNTER — Other Ambulatory Visit (INDEPENDENT_AMBULATORY_CARE_PROVIDER_SITE_OTHER): Payer: No Typology Code available for payment source

## 2019-01-18 ENCOUNTER — Ambulatory Visit (INDEPENDENT_AMBULATORY_CARE_PROVIDER_SITE_OTHER)
Admission: RE | Admit: 2019-01-18 | Discharge: 2019-01-18 | Disposition: A | Discharge status: Home / Self Care | Payer: No Typology Code available for payment source | Source: Ambulatory Visit | Attending: Internal Medicine | Admitting: Internal Medicine

## 2019-01-18 ENCOUNTER — Other Ambulatory Visit: Payer: Self-pay

## 2019-01-18 VITALS — BP 174/84 | HR 67 | Temp 98.3°F | Resp 16 | Ht 65.0 in | Wt 215.0 lb

## 2019-01-18 DIAGNOSIS — I10 Essential (primary) hypertension: Secondary | ICD-10-CM

## 2019-01-18 DIAGNOSIS — R10817 Generalized abdominal tenderness: Secondary | ICD-10-CM

## 2019-01-18 DIAGNOSIS — Z23 Encounter for immunization: Secondary | ICD-10-CM | POA: Diagnosis not present

## 2019-01-18 DIAGNOSIS — E559 Vitamin D deficiency, unspecified: Secondary | ICD-10-CM

## 2019-01-18 LAB — CBC WITH DIFFERENTIAL/PLATELET
Basophils Absolute: 0.1 10*3/uL (ref 0.0–0.1)
Basophils Relative: 0.9 % (ref 0.0–3.0)
Eosinophils Absolute: 0 10*3/uL (ref 0.0–0.7)
Eosinophils Relative: 0.7 % (ref 0.0–5.0)
HCT: 39.8 % (ref 36.0–46.0)
Hemoglobin: 12.9 g/dL (ref 12.0–15.0)
Lymphocytes Relative: 33.9 % (ref 12.0–46.0)
Lymphs Abs: 2.3 10*3/uL (ref 0.7–4.0)
MCHC: 32.5 g/dL (ref 30.0–36.0)
MCV: 83.6 fl (ref 78.0–100.0)
Monocytes Absolute: 0.5 10*3/uL (ref 0.1–1.0)
Monocytes Relative: 7.6 % (ref 3.0–12.0)
Neutro Abs: 3.9 10*3/uL (ref 1.4–7.7)
Neutrophils Relative %: 56.9 % (ref 43.0–77.0)
Platelets: 284 10*3/uL (ref 150.0–400.0)
RBC: 4.76 Mil/uL (ref 3.87–5.11)
RDW: 13.9 % (ref 11.5–15.5)
WBC: 6.8 10*3/uL (ref 4.0–10.5)

## 2019-01-18 LAB — POC URINALSYSI DIPSTICK (AUTOMATED)
Bilirubin, UA: NEGATIVE
Blood, UA: NEGATIVE
Glucose, UA: NEGATIVE
Ketones, UA: NEGATIVE
Leukocytes, UA: NEGATIVE
Nitrite, UA: NEGATIVE
Protein, UA: NEGATIVE
Spec Grav, UA: 1.01 (ref 1.010–1.025)
Urobilinogen, UA: 0.2 E.U./dL
pH, UA: 6 (ref 5.0–8.0)

## 2019-01-18 MED ORDER — INDAPAMIDE 1.25 MG PO TABS: 1.2500 mg | ORAL_TABLET | Freq: Every day | ORAL | 0 refills | Status: DC

## 2019-01-18 NOTE — Progress Notes (Signed)
Subjective:  Patient ID: Betty Bell, female    DOB: 01-03-1961  Age: 58 y.o. MRN: [id number]  CC: Hypertension and Abdominal Pain   HPI Shvonne Marcelino presents for f/up - She complains of a 3-day history of abdominal pain that she describes as a few diffuse aching sensation.  She denies nausea, vomiting, odynophagia, dysphagia, loss of appetite, weight loss, bright red blood per rectum, melena, diarrhea, or constipation.  The pain is not changed by food intake.  She is no longer taking Bystolic.  She stopped taking it months ago because she thought it caused shortness of breath.  She has not been monitoring her blood pressure.  She denies headache, blurred vision, chest pain, shortness of breath, edema, DOE, or palpitations.  Outpatient Medications Prior to Visit  Medication Sig Dispense Refill  . cetirizine (ZYRTEC) 10 MG tablet Take 10 mg by mouth daily.    . famotidine (PEPCID) 20 MG tablet Take 2 tablets (40 mg total) by mouth 2 (two) times daily. 60 tablet 3  . nebivolol (BYSTOLIC) 5 MG tablet Take 1 tablet (5 mg total) by mouth daily. (Patient not taking: Reported on 01/18/2019) 30 tablet 1   No facility-administered medications prior to visit.     ROS Review of Systems  Constitutional: Positive for unexpected weight change (wt gain). Negative for appetite change, chills, diaphoresis and fatigue.  HENT: Negative.   Eyes: Negative.   Respiratory: Negative for cough, chest tightness, shortness of breath and wheezing.   Cardiovascular: Negative for chest pain, palpitations and leg swelling.  Gastrointestinal: Positive for abdominal pain. Negative for abdominal distention, anal bleeding, blood in stool, constipation, diarrhea, nausea, rectal pain and vomiting.  Endocrine: Negative.   Genitourinary: Negative.  Negative for difficulty urinating and hematuria.  Musculoskeletal: Negative.  Negative for arthralgias and myalgias.  Skin: Negative.  Negative for color change.   Neurological: Negative.  Negative for dizziness, weakness, light-headedness and headaches.  Hematological: Negative for adenopathy. Does not bruise/bleed easily.  Psychiatric/Behavioral: Negative.     Objective:  BP (!) 174/84 (BP Location: Left Arm, Patient Position: Sitting, Cuff Size: Large)   Pulse 67   Temp 98.3 F (36.8 C) (Oral)   Resp 16   Ht 5\' 5"  (1.651 m)   Wt 215 lb (97.5 kg)   LMP 03/26/2011   SpO2 99%   BMI 35.78 kg/m   BP Readings from Last 3 Encounters:  01/18/19 (!) 174/84  09/21/18 136/78  08/03/18 (!) 160/90    Wt Readings from Last 3 Encounters:  01/18/19 215 lb (97.5 kg)  09/21/18 206 lb 12.8 oz (93.8 kg)  08/03/18 210 lb 4 oz (95.4 kg)    Physical Exam Vitals signs reviewed.  Constitutional:      General: She is not in acute distress.    Appearance: She is well-developed. She is not ill-appearing, toxic-appearing or diaphoretic.  HENT:     Mouth/Throat:     Mouth: Mucous membranes are moist.     Pharynx: No pharyngeal swelling or oropharyngeal exudate.  Eyes:     General: No scleral icterus. Cardiovascular:     Rate and Rhythm: Normal rate and regular rhythm.     Heart sounds: Normal heart sounds. No murmur.     Comments: EKG ---  Sinus  Rhythm  WITHIN NORMAL LIMITS  Pulmonary:     Effort: Pulmonary effort is normal. No respiratory distress.     Breath sounds: No stridor. No wheezing, rhonchi or rales.  Abdominal:     General:  Abdomen is protuberant. Bowel sounds are decreased. There is no distension.     Palpations: Abdomen is soft. There is no hepatomegaly or splenomegaly.     Tenderness: There is abdominal tenderness in the right lower quadrant, epigastric area and suprapubic area. There is no guarding or rebound.     Hernia: No hernia is present.  Musculoskeletal: Normal range of motion.     Right lower leg: No edema.     Left lower leg: No edema.  Skin:    General: Skin is warm and dry.  Neurological:     General: No focal  deficit present.     Mental Status: She is alert.  Psychiatric:        Mood and Affect: Mood normal.        Behavior: Behavior normal.     Lab Results  Component Value Date   WBC 6.8 01/18/2019   HGB 12.9 01/18/2019   HCT 39.8 01/18/2019   PLT 284.0 01/18/2019   GLUCOSE 86 01/18/2019   CHOL 183 02/24/2018   TRIG 84.0 02/24/2018   HDL 52.00 02/24/2018   LDLCALC 115 (H) 02/24/2018   ALT 16 01/18/2019   AST 23 01/18/2019   NA 139 01/18/2019   K 3.8 01/18/2019   CL 104 01/18/2019   CREATININE 0.62 01/18/2019   BUN 7 01/18/2019   CO2 25 01/18/2019   TSH 0.60 01/18/2019   HGBA1C 5.4 02/24/2018    Mm Digital Screening Bilateral  Result Date: 12/28/2017 CLINICAL DATA:  Screening. EXAM: DIGITAL SCREENING BILATERAL MAMMOGRAM WITH CAD COMPARISON:  Previous exam(s). ACR Breast Density Category a: The breast tissue is almost entirely fatty. FINDINGS: There are no findings suspicious for malignancy. Images were processed with CAD. IMPRESSION: No mammographic evidence of malignancy. A result letter of this screening mammogram will be mailed directly to the patient. RECOMMENDATION: Screening mammogram in one year. (Code:SM-B-01Y) BI-RADS CATEGORY  1: Negative. Electronically Signed   By: Franki Cabot M.D.   On: 12/28/2017 16:11    Dg Abd Acute 2+v W 1v Chest  Result Date: 01/18/2019 CLINICAL DATA:  Abdominal pain for 3 days.  Diarrhea starting today. EXAM: DG ABDOMEN ACUTE W/ 1V CHEST COMPARISON:  Multiple exams, including 06/10/2017 chest CT and CT abdomen from 02/28/2014 FINDINGS: The lungs appear clear. Cardiac and mediastinal margins appear normal. No blunting of the costophrenic angles. Formed stool noted in the right colon. Nondilated loops of gas-filled small bowel to the left of midline in the upper abdomen. She small amount of rectal gas noted. No significant abnormal air-fluid levels. IMPRESSION: 1. Unremarkable bowel gas pattern.  The chest appears unremarkable. Electronically Signed    By: Van Clines M.D.   On: 01/18/2019 17:16     Assessment & Plan:   Alvaretta was seen today for hypertension and abdominal pain.  Diagnoses and all orders for this visit:  Essential hypertension- Her blood pressure is not adequately well controlled.  Her EKG is negative for LVH or ischemia.  Her labs are negative for secondary causes or endorgan damage.  I have asked her to treat this with a thiazide diuretic. -     CBC with Differential/Platelet; Future -     Basic metabolic panel; Future -     TSH; Future -     Cancel: Urinalysis, Routine w reflex microscopic; Future -     VITAMIN D 25 Hydroxy (Vit-D Deficiency, Fractures); Future -     EKG 12-Lead -     indapamide (LOZOL) 1.25 MG  tablet; Take 1 tablet (1.25 mg total) by mouth daily.  Need for influenza vaccination -     Flu Vaccine QUAD 36+ mos IM  Generalized abdominal tenderness without rebound tenderness- She has vague abdominal pain and tenderness.  Examination is reassuring.  Labs and x-ray are all normal.  This is reassuring that she does not have an acute abdominal process.  I think this is likely dyspepsia or IBS.  I have asked her to continue taking famotidine and to let me know if she develops any new or worsening symptoms. -     CBC with Differential/Platelet; Future -     Lipase; Future -     Cancel: Urinalysis, Routine w reflex microscopic; Future -     Hepatic function panel; Future -     Amylase; Future -     DG ABD ACUTE 2+V W 1V CHEST; Future -     POCT Urinalysis Dipstick (Automated)  Vitamin D deficiency disease -     Cholecalciferol 1.25 MG (50000 UT) capsule; Take 1 capsule (50,000 Units total) by mouth once a week.   I have discontinued [name] "[name]"'s nebivolol. I am also having her start on indapamide and Cholecalciferol. Additionally, I am having her maintain her cetirizine and famotidine.  Meds ordered this encounter  Medications  . indapamide (LOZOL) 1.25 MG tablet    Sig: Take 1  tablet (1.25 mg total) by mouth daily.    Dispense:  90 tablet    Refill:  0  . Cholecalciferol 1.25 MG (50000 UT) capsule    Sig: Take 1 capsule (50,000 Units total) by mouth once a week.    Dispense:  12 capsule    Refill:  0     Follow-up: Return in about 1 week (around 01/25/2019).  Scarlette Calico, MD

## 2019-01-18 NOTE — Patient Instructions (Signed)
Abdominal Pain, Adult Abdominal pain can be caused by many things. Often, abdominal pain is not serious and it gets better with no treatment or by being treated at home. However, sometimes abdominal pain is serious. Your health care provider will do a medical history and a physical exam to try to determine the cause of your abdominal pain. Follow these instructions at home:  Take over-the-counter and prescription medicines only as told by your health care provider. Do not take a laxative unless told by your health care provider.  Drink enough fluid to keep your urine clear or pale yellow.  Watch your condition for any changes.  Keep all follow-up visits as told by your health care provider. This is important. Contact a health care provider if:  Your abdominal pain changes or gets worse.  You are not hungry or you lose weight without trying.  You are constipated or have diarrhea for more than 2-3 days.  You have pain when you urinate or have a bowel movement.  Your abdominal pain wakes you up at night.  Your pain gets worse with meals, after eating, or with certain foods.  You are throwing up and cannot keep anything down.  You have a fever. Get help right away if:  Your pain does not go away as soon as your health care provider told you to expect.  You cannot stop throwing up.  Your pain is only in areas of the abdomen, such as the right side or the left lower portion of the abdomen.  You have bloody or black stools, or stools that look like tar.  You have severe pain, cramping, or bloating in your abdomen.  You have signs of dehydration, such as: ? Dark urine, very little urine, or no urine. ? Cracked lips. ? Dry mouth. ? Sunken eyes. ? Sleepiness. ? Weakness. This information is not intended to replace advice given to you by your health care provider. Make sure you discuss any questions you have with your health care provider. Document Released: 02/04/2005 Document  Revised: 11/15/2015 Document Reviewed: 10/09/2015 Elsevier Interactive Patient Education  2020 [employer]  

## 2019-01-19 DIAGNOSIS — R10817 Generalized abdominal tenderness: Secondary | ICD-10-CM | POA: Insufficient documentation

## 2019-01-19 DIAGNOSIS — E559 Vitamin D deficiency, unspecified: Secondary | ICD-10-CM | POA: Insufficient documentation

## 2019-01-19 LAB — HEPATIC FUNCTION PANEL
ALT: 16 U/L (ref 0–35)
AST: 23 U/L (ref 0–37)
Albumin: 4.3 g/dL (ref 3.5–5.2)
Alkaline Phosphatase: 86 U/L (ref 39–117)
Bilirubin, Direct: 0 mg/dL (ref 0.0–0.3)
Total Bilirubin: 0.4 mg/dL (ref 0.2–1.2)
Total Protein: 7.6 g/dL (ref 6.0–8.3)

## 2019-01-19 LAB — BASIC METABOLIC PANEL
BUN: 7 mg/dL (ref 6–23)
CO2: 25 mEq/L (ref 19–32)
Calcium: 9.4 mg/dL (ref 8.4–10.5)
Chloride: 104 mEq/L (ref 96–112)
Creatinine, Ser: 0.62 mg/dL (ref 0.40–1.20)
GFR: 119.29 mL/min (ref >60.00)
Glucose, Bld: 86 mg/dL (ref 70–99)
Potassium: 3.8 mEq/L (ref 3.5–5.1)
Sodium: 139 mEq/L (ref 135–145)

## 2019-01-19 LAB — VITAMIN D 25 HYDROXY (VIT D DEFICIENCY, FRACTURES): VITD: 19.55 ng/mL — ABNORMAL LOW (ref 30.00–100.00)

## 2019-01-19 LAB — LIPASE: Lipase: 9 U/L — ABNORMAL LOW (ref 11.0–59.0)

## 2019-01-19 LAB — TSH: TSH: 0.6 u[IU]/mL (ref 0.35–4.50)

## 2019-01-19 LAB — AMYLASE: Amylase: 44 U/L (ref 27–131)

## 2019-01-19 MED ORDER — CHOLECALCIFEROL 1.25 MG (50000 UT) PO CAPS: 50000.0000 [IU] | ORAL_CAPSULE | ORAL | 0 refills | Status: DC

## 2019-01-30 ENCOUNTER — Other Ambulatory Visit: Payer: Self-pay

## 2019-01-30 ENCOUNTER — Ambulatory Visit (INDEPENDENT_AMBULATORY_CARE_PROVIDER_SITE_OTHER): Payer: No Typology Code available for payment source | Admitting: Internal Medicine

## 2019-01-30 ENCOUNTER — Encounter: Payer: Self-pay | Admitting: Internal Medicine

## 2019-01-30 VITALS — BP 138/84 | HR 78 | Temp 98.3°F | Resp 16 | Ht 65.0 in | Wt 212.0 lb

## 2019-01-30 DIAGNOSIS — I10 Essential (primary) hypertension: Secondary | ICD-10-CM | POA: Diagnosis not present

## 2019-01-30 NOTE — Progress Notes (Signed)
Subjective:  Patient ID: Betty Bell, female    DOB: [birth year]  Age: 58 y.o. MRN: [id number]  CC: Hypertension   HPI Betty Bell presents for f/up - She tells me her blood pressure has been well controlled.  She has been working on her lifestyle modifications with diet and exercise and has been able to lose weight.  She denies any recent episodes of headache, blurred vision, chest pain, shortness of breath, palpitations, edema, or fatigue.  Outpatient Medications Prior to Visit  Medication Sig Dispense Refill  . cetirizine (ZYRTEC) 10 MG tablet Take 10 mg by mouth daily.    . Cholecalciferol 1.25 MG (50000 UT) capsule Take 1 capsule (50,000 Units total) by mouth once a week. 12 capsule 0  . famotidine (PEPCID) 20 MG tablet Take 2 tablets (40 mg total) by mouth 2 (two) times daily. 60 tablet 3  . indapamide (LOZOL) 1.25 MG tablet Take 1 tablet (1.25 mg total) by mouth daily. 90 tablet 0   No facility-administered medications prior to visit.     ROS Review of Systems  Constitutional: Negative.  Negative for diaphoresis, fatigue and unexpected weight change.  HENT: Negative.   Eyes: Negative for visual disturbance.  Respiratory: Negative for cough, chest tightness, shortness of breath and wheezing.   Gastrointestinal: Negative for abdominal pain, constipation, diarrhea, nausea and vomiting.  Endocrine: Negative.   Genitourinary: Negative.  Negative for difficulty urinating, dysuria and hematuria.  Musculoskeletal: Negative for arthralgias, back pain, myalgias and neck pain.  Skin: Negative.  Negative for color change, pallor and rash.  Neurological: Negative.  Negative for dizziness, weakness, light-headedness and headaches.  Hematological: Negative for adenopathy. Does not bruise/bleed easily.  Psychiatric/Behavioral: Negative.     Objective:  BP 140/84 (BP Location: Left Arm, Patient Position: Sitting, Cuff Size: Large)   Pulse 78   Temp 98.3 F (36.8 C) (Oral)    Resp 16   Ht 5\' 5"  (1.651 m)   Wt 212 lb (96.2 kg)   LMP 03/26/2011   SpO2 98%   BMI 35.28 kg/m   BP Readings from Last 3 Encounters:  01/30/19 140/84  01/18/19 (!) 174/84  09/21/18 136/78    Wt Readings from Last 3 Encounters:  01/30/19 212 lb (96.2 kg)  01/18/19 215 lb (97.5 kg)  09/21/18 206 lb 12.8 oz (93.8 kg)    Physical Exam Vitals signs reviewed.  Constitutional:      Appearance: She is obese.  HENT:     Nose: Nose normal.     Mouth/Throat:     Mouth: Mucous membranes are moist.  Eyes:     General: No scleral icterus.    Conjunctiva/sclera: Conjunctivae normal.  Neck:     Musculoskeletal: Normal range of motion and neck supple.  Cardiovascular:     Rate and Rhythm: Normal rate and regular rhythm.     Heart sounds: No murmur.  Pulmonary:     Effort: Pulmonary effort is normal.     Breath sounds: No stridor. No wheezing, rhonchi or rales.  Abdominal:     General: Abdomen is protuberant. Bowel sounds are normal.     Palpations: There is no hepatomegaly or splenomegaly.     Tenderness: There is no abdominal tenderness.  Musculoskeletal: Normal range of motion.     Right lower leg: No edema.     Left lower leg: No edema.  Lymphadenopathy:     Cervical: No cervical adenopathy.  Skin:    General: Skin is warm and dry.  Coloration: Skin is not pale.  Neurological:     General: No focal deficit present.     Mental Status: She is alert.  Psychiatric:        Mood and Affect: Mood normal.        Behavior: Behavior normal.     Lab Results  Component Value Date   WBC 6.8 01/18/2019   HGB 12.9 01/18/2019   HCT 39.8 01/18/2019   PLT 284.0 01/18/2019   GLUCOSE 86 01/18/2019   CHOL 183 02/24/2018   TRIG 84.0 02/24/2018   HDL 52.00 02/24/2018   LDLCALC 115 (H) 02/24/2018   ALT 16 01/18/2019   AST 23 01/18/2019   NA 139 01/18/2019   K 3.8 01/18/2019   CL 104 01/18/2019   CREATININE 0.62 01/18/2019   BUN 7 01/18/2019   CO2 25 01/18/2019   TSH 0.60  01/18/2019   HGBA1C 5.4 02/24/2018    Dg Abd Acute 2+v W 1v Chest  Result Date: 01/18/2019 CLINICAL DATA:  Abdominal pain for 3 days.  Diarrhea starting today. EXAM: DG ABDOMEN ACUTE W/ 1V CHEST COMPARISON:  Multiple exams, including 06/10/2017 chest CT and CT abdomen from 02/28/2014 FINDINGS: The lungs appear clear. Cardiac and mediastinal margins appear normal. No blunting of the costophrenic angles. Formed stool noted in the right colon. Nondilated loops of gas-filled small bowel to the left of midline in the upper abdomen. She small amount of rectal gas noted. No significant abnormal air-fluid levels. IMPRESSION: 1. Unremarkable bowel gas pattern.  The chest appears unremarkable. Electronically Signed   By: Van Clines M.D.   On: 01/18/2019 17:16    Assessment & Plan:   Betty Bell was seen today for hypertension.  Diagnoses and all orders for this visit:  Essential hypertension- Her blood pressure is adequately well controlled.  Will continue the thiazide diuretic at the current dose.   I am having Betty Bell "Betty Bell" maintain her cetirizine, famotidine, indapamide, and Cholecalciferol.  No orders of the defined types were placed in this encounter.    Follow-up: No follow-ups on file.  Scarlette Calico, MD

## 2019-01-31 NOTE — Patient Instructions (Signed)

## 2019-02-07 ENCOUNTER — Encounter: Payer: Self-pay | Admitting: Gynecology

## 2019-02-11 ENCOUNTER — Other Ambulatory Visit: Payer: Self-pay

## 2019-02-11 ENCOUNTER — Ambulatory Visit (INDEPENDENT_AMBULATORY_CARE_PROVIDER_SITE_OTHER): Payer: No Typology Code available for payment source | Admitting: Family Medicine

## 2019-02-11 DIAGNOSIS — R059 Cough, unspecified: Secondary | ICD-10-CM

## 2019-02-11 DIAGNOSIS — R05 Cough: Secondary | ICD-10-CM

## 2019-02-11 MED ORDER — AZITHROMYCIN 250 MG PO TABS: ORAL_TABLET | ORAL | 1 refills | Status: DC

## 2019-02-11 NOTE — Progress Notes (Signed)
Virtual Visit via Video Note  I connected with Betty Bell  on 02/11/19 at 11:40 AM EDT by a video enabled telemedicine application and verified that I am speaking with the correct person using two identifiers.  Location patient: home Location provider:work or home office Persons participating in the virtual visit: patient, provider  I discussed the limitations of evaluation and management by telemedicine and the availability of in person appointments. The patient expressed understanding and agreed to proceed.   Betty Bell DOB: 05-05-1961 Encounter date: 02/11/2019  This is a 58 y.o. female who presents with No chief complaint on file.   History of present illness: Started getting sick on Thursday last week. Then Monday started feeling worse. Cough increased on Tuesday. Took cough meds from April which helped. Not productive. Clear nasal drainage. Has been around grandchildren and takes care of elderly mother so just wanted to touch base on symptoms.  No fevers. No aches. Not hard to breathe, not tight in chest.   Has used inhaler just with old resp infection. Doesn't use this regularly. Has actually expired but doesn't feel like she needs refilled. Has had prednisone shot in past for resp issues if worsening.   No sinus pressure/pain, but had this before. Better with getting up and moving around.   Overall, feels like all symptoms are getting better. Not interested in COVID testing. She has slight headache, but drainage and cough have improved. She is worried about what to do if things worsen at this point.   Allergies  Allergen Reactions  . Aspirin     REACTION: Swelling  . Cephalosporins     Stomach cramps  . Ciprofloxacin     Stomach cramps  . Fish Allergy     Shortness of breath  . Nitrofurantoin Monohyd Macro     Stomach cramps  . Penicillins     Per pt: unknown  . Protonix [Pantoprazole Sodium]     Respiratory distress  . Sulfa Antibiotics     Per pt:  unknown   No outpatient medications have been marked as taking for the 02/11/19 encounter (Office Visit) with Caren Macadam, MD.    Review of Systems  Constitutional: Negative for chills, fatigue and fever.  HENT: Positive for congestion and postnasal drip. Negative for ear pain, sinus pressure, sinus pain (improved), sore throat and trouble swallowing.   Respiratory: Positive for cough. Negative for chest tightness, shortness of breath and wheezing.   Cardiovascular: Negative for chest pain, palpitations and leg swelling.    Objective:  LMP 03/26/2011       BP Readings from Last 3 Encounters:  01/30/19 138/84  01/18/19 (!) 174/84  09/21/18 136/78   Wt Readings from Last 3 Encounters:  01/30/19 212 lb (96.2 kg)  01/18/19 215 lb (97.5 kg)  09/21/18 206 lb 12.8 oz (93.8 kg)    EXAM:  GENERAL: alert, oriented, appears well and in no acute distress  HEENT: atraumatic, conjunctiva clear, no obvious abnormalities on inspection of external nose and ears  NECK: normal movements of the head and neck  LUNGS: on inspection no signs of respiratory distress, breathing rate appears normal, no obvious gross SOB, gasping or wheezing  CV: no obvious cyanosis  MS: moves all visible extremities without noticeable abnormality  PSYCH/NEURO: pleasant and cooperative, no obvious depression or anxiety, speech and thought processing grossly intact   Assessment/Plan  1. Cough Suspect viral URI. Since feeling better at this point, we discussed no further treatment should be needed. OK to  continue with cough medication prn.  She did feel reassured having rx at pharmacy in case of worsening cough symptoms so I sent in zpack for her which she has tolerated well in past. We discussed that if breathing feels more difficult she needs to be assessed.   I discussed the assessment and treatment plan with the patient. The patient was provided an opportunity to ask questions and all were answered.  The patient agreed with the plan and demonstrated an understanding of the instructions.   The patient was advised to call back or seek an in-person evaluation if the symptoms worsen or if the condition fails to improve as anticipated.  Return if symptoms worsen or fail to improve.   I provided 15 minutes of non-face-to-face time during this encounter.   Micheline Rough, MD

## 2019-03-06 ENCOUNTER — Encounter: Payer: No Typology Code available for payment source | Admitting: Internal Medicine

## 2019-03-20 ENCOUNTER — Other Ambulatory Visit: Payer: Self-pay

## 2019-03-20 ENCOUNTER — Other Ambulatory Visit (INDEPENDENT_AMBULATORY_CARE_PROVIDER_SITE_OTHER): Payer: No Typology Code available for payment source

## 2019-03-20 ENCOUNTER — Encounter: Payer: Self-pay | Admitting: Internal Medicine

## 2019-03-20 ENCOUNTER — Ambulatory Visit (INDEPENDENT_AMBULATORY_CARE_PROVIDER_SITE_OTHER): Payer: No Typology Code available for payment source | Admitting: Internal Medicine

## 2019-03-20 VITALS — BP 138/80 | HR 80 | Temp 98.2°F | Resp 16 | Ht 65.0 in | Wt 213.0 lb

## 2019-03-20 DIAGNOSIS — Z0000 Encounter for general adult medical examination without abnormal findings: Secondary | ICD-10-CM

## 2019-03-20 DIAGNOSIS — E559 Vitamin D deficiency, unspecified: Secondary | ICD-10-CM

## 2019-03-20 DIAGNOSIS — Z1231 Encounter for screening mammogram for malignant neoplasm of breast: Secondary | ICD-10-CM

## 2019-03-20 DIAGNOSIS — I10 Essential (primary) hypertension: Secondary | ICD-10-CM

## 2019-03-20 DIAGNOSIS — Z124 Encounter for screening for malignant neoplasm of cervix: Secondary | ICD-10-CM | POA: Insufficient documentation

## 2019-03-20 LAB — LIPID PANEL
Cholesterol: 178 mg/dL (ref 0–200)
HDL: 49.6 mg/dL (ref >39.00)
LDL Cholesterol: 108 mg/dL — ABNORMAL HIGH (ref 0–99)
NonHDL: 128.66
Total CHOL/HDL Ratio: 4
Triglycerides: 105 mg/dL (ref 0.0–149.0)
VLDL: 21 mg/dL (ref 0.0–40.0)

## 2019-03-20 LAB — VITAMIN D 25 HYDROXY (VIT D DEFICIENCY, FRACTURES): VITD: 27.34 ng/mL — ABNORMAL LOW (ref 30.00–100.00)

## 2019-03-20 MED ORDER — INDAPAMIDE 1.25 MG PO TABS: 1.2500 mg | ORAL_TABLET | Freq: Every day | ORAL | 1 refills | Status: DC

## 2019-03-20 NOTE — Progress Notes (Signed)
Subjective:  Patient ID: Betty Bell, female    DOB: 1961/04/02  Age: 58 y.o. MRN: [id number]  CC: Hypertension and Annual Exam   HPI Betty Bell presents for a CPX.  She tells me her blood pressure has been well controlled.  She denies any recent episodes of headache, blurred vision, chest pain, shortness of breath, or edema.  Outpatient Medications Prior to Visit  Medication Sig Dispense Refill  . cetirizine (ZYRTEC) 10 MG tablet Take 10 mg by mouth daily.    . famotidine (PEPCID) 20 MG tablet Take 2 tablets (40 mg total) by mouth 2 (two) times daily. 60 tablet 3  . azithromycin (ZITHROMAX Z-PAK) 250 MG tablet 2 tab by mouth day 1, then 1 per day 6 tablet 1  . Cholecalciferol 1.25 MG (50000 UT) capsule Take 1 capsule (50,000 Units total) by mouth once a week. (Patient not taking: Reported on 03/20/2019) 12 capsule 0  . indapamide (LOZOL) 1.25 MG tablet Take 1 tablet (1.25 mg total) by mouth daily. (Patient not taking: Reported on 03/20/2019) 90 tablet 0   No facility-administered medications prior to visit.     ROS Review of Systems  Constitutional: Negative for diaphoresis, fatigue and unexpected weight change.  HENT: Negative.   Eyes: Negative for visual disturbance.  Respiratory: Negative for cough, chest tightness, shortness of breath and wheezing.   Cardiovascular: Negative for chest pain, palpitations and leg swelling.  Gastrointestinal: Negative for abdominal pain, constipation, diarrhea and nausea.  Endocrine: Negative.   Genitourinary: Negative.  Negative for difficulty urinating, dysuria and hematuria.  Musculoskeletal: Negative for arthralgias.  Skin: Negative.  Negative for color change and pallor.  Neurological: Negative.  Negative for dizziness, weakness, light-headedness and headaches.  Hematological: Negative for adenopathy. Does not bruise/bleed easily.  Psychiatric/Behavioral: Negative.     Objective:  BP 138/80 (BP Location: Left Arm, Patient  Position: Sitting, Cuff Size: Large)   Pulse 80   Temp 98.2 F (36.8 C) (Oral)   Resp 16   Ht 5\' 5"  (1.651 m)   Wt 213 lb (96.6 kg)   LMP 03/26/2011   SpO2 97%   BMI 35.45 kg/m   BP Readings from Last 3 Encounters:  03/20/19 138/80  01/30/19 138/84  01/18/19 (!) 174/84    Wt Readings from Last 3 Encounters:  03/20/19 213 lb (96.6 kg)  01/30/19 212 lb (96.2 kg)  01/18/19 215 lb (97.5 kg)    Physical Exam Vitals signs reviewed.  Constitutional:      Appearance: Normal appearance. She is obese.  HENT:     Nose: Nose normal.     Mouth/Throat:     Mouth: Mucous membranes are moist.  Eyes:     General: No scleral icterus.    Conjunctiva/sclera: Conjunctivae normal.  Neck:     Musculoskeletal: Neck supple.  Cardiovascular:     Rate and Rhythm: Normal rate and regular rhythm.     Heart sounds: No murmur.  Pulmonary:     Effort: Pulmonary effort is normal.     Breath sounds: No stridor. No wheezing, rhonchi or rales.  Abdominal:     General: Abdomen is protuberant. Bowel sounds are normal. There is no distension.     Palpations: Abdomen is soft. There is no hepatomegaly or splenomegaly.     Tenderness: There is no abdominal tenderness.  Musculoskeletal: Normal range of motion.     Right lower leg: No edema.     Left lower leg: No edema.  Lymphadenopathy:  Cervical: No cervical adenopathy.  Skin:    General: Skin is warm and dry.     Coloration: Skin is not pale.  Neurological:     General: No focal deficit present.     Mental Status: She is alert.  Psychiatric:        Mood and Affect: Mood normal.        Behavior: Behavior normal.     Lab Results  Component Value Date   WBC 6.8 01/18/2019   HGB 12.9 01/18/2019   HCT 39.8 01/18/2019   PLT 284.0 01/18/2019   GLUCOSE 86 01/18/2019   CHOL 178 03/20/2019   TRIG 105.0 03/20/2019   HDL 49.60 03/20/2019   LDLCALC 108 (H) 03/20/2019   ALT 16 01/18/2019   AST 23 01/18/2019   NA 139 01/18/2019   K 3.8  01/18/2019   CL 104 01/18/2019   CREATININE 0.62 01/18/2019   BUN 7 01/18/2019   CO2 25 01/18/2019   TSH 0.60 01/18/2019   HGBA1C 5.4 02/24/2018    Dg Abd Acute 2+v W 1v Chest  Result Date: 01/18/2019 CLINICAL DATA:  Abdominal pain for 3 days.  Diarrhea starting today. EXAM: DG ABDOMEN ACUTE W/ 1V CHEST COMPARISON:  Multiple exams, including 06/10/2017 chest CT and CT abdomen from 02/28/2014 FINDINGS: The lungs appear clear. Cardiac and mediastinal margins appear normal. No blunting of the costophrenic angles. Formed stool noted in the right colon. Nondilated loops of gas-filled small bowel to the left of midline in the upper abdomen. She small amount of rectal gas noted. No significant abnormal air-fluid levels. IMPRESSION: 1. Unremarkable bowel gas pattern.  The chest appears unremarkable. Electronically Signed   By: Van Clines M.D.   On: 01/18/2019 17:16    Assessment & Plan:   Betty Bell was seen today for hypertension and annual exam.  Diagnoses and all orders for this visit:  Essential hypertension- Her blood pressure is adequately well controlled.  Recent electrolytes and renal function were normal.  Will continue the current dose of indapamide. -     indapamide (LOZOL) 1.25 MG tablet; Take 1 tablet (1.25 mg total) by mouth daily.  Vitamin D deficiency disease -     Vitamin D 25 hydroxy; Future -     Cholecalciferol 50 MCG (2000 UT) TABS; Take 1 tablet (2,000 Units total) by mouth daily.  Routine general medical examination at a health care facility- Exam completed, labs reviewed-statin therapy is not indicated, vaccines reviewed, colon cancer screening is up-to-date, she is referred for cervical cancer screening and breast cancer screening, patient education was given. -     Lipid panel; Future  Visit for screening mammogram -     MM Digital Screening; Future  Cervical cancer screening -     Ambulatory referral to Gynecology   I have discontinued Betty Bell  "Betty Bell"'s Cholecalciferol and azithromycin. I am also having her start on Cholecalciferol. Additionally, I am having her maintain her cetirizine, famotidine, and indapamide.  Meds ordered this encounter  Medications  . indapamide (LOZOL) 1.25 MG tablet    Sig: Take 1 tablet (1.25 mg total) by mouth daily.    Dispense:  90 tablet    Refill:  1  . Cholecalciferol 50 MCG (2000 UT) TABS    Sig: Take 1 tablet (2,000 Units total) by mouth daily.    Dispense:  90 tablet    Refill:  1     Follow-up: Return in about 6 months (around 09/17/2019).  Scarlette Calico, MD

## 2019-03-20 NOTE — Patient Instructions (Signed)
Health Maintenance, Female Adopting a healthy lifestyle and getting preventive care are important in promoting health and wellness. Ask your health care provider about:  The right schedule for you to have regular tests and exams.  Things you can do on your own to prevent diseases and keep yourself healthy. What should I know about diet, weight, and exercise? Eat a healthy diet   Eat a diet that includes plenty of vegetables, fruits, low-fat dairy products, and lean protein.  Do not eat a lot of foods that are high in solid fats, added sugars, or sodium. Maintain a healthy weight Body mass index (BMI) is used to identify weight problems. It estimates body fat based on height and weight. Your health care provider can help determine your BMI and help you achieve or maintain a healthy weight. Get regular exercise Get regular exercise. This is one of the most important things you can do for your health. Most adults should:  Exercise for at least 150 minutes each week. The exercise should increase your heart rate and make you sweat (moderate-intensity exercise).  Do strengthening exercises at least twice a week. This is in addition to the moderate-intensity exercise.  Spend less time sitting. Even light physical activity can be beneficial. Watch cholesterol and blood lipids Have your blood tested for lipids and cholesterol at 59 years of age, then have this test every 5 years. Have your cholesterol levels checked more often if:  Your lipid or cholesterol levels are high.  You are older than 59 years of age.  You are at high risk for heart disease. What should I know about cancer screening? Depending on your health history and family history, you may need to have cancer screening at various ages. This may include screening for:  Breast cancer.  Cervical cancer.  Colorectal cancer.  Skin cancer.  Lung cancer. What should I know about heart disease, diabetes, and high blood  pressure? Blood pressure and heart disease  High blood pressure causes heart disease and increases the risk of stroke. This is more likely to develop in people who have high blood pressure readings, are of African descent, or are overweight.  Have your blood pressure checked: ? Every 3-5 years if you are 18-39 years of age. ? Every year if you are 40 years old or older. Diabetes Have regular diabetes screenings. This checks your fasting blood sugar level. Have the screening done:  Once every three years after age 40 if you are at a normal weight and have a low risk for diabetes.  More often and at a younger age if you are overweight or have a high risk for diabetes. What should I know about preventing infection? Hepatitis B If you have a higher risk for hepatitis B, you should be screened for this virus. Talk with your health care provider to find out if you are at risk for hepatitis B infection. Hepatitis C Testing is recommended for:  Everyone born from 1945 through 1965.  Anyone with known risk factors for hepatitis C. Sexually transmitted infections (STIs)  Get screened for STIs, including gonorrhea and chlamydia, if: ? You are sexually active and are younger than 59 years of age. ? You are older than 59 years of age and your health care provider tells you that you are at risk for this type of infection. ? Your sexual activity has changed since you were last screened, and you are at increased risk for chlamydia or gonorrhea. Ask your health care provider if   you are at risk.  Ask your health care provider about whether you are at high risk for HIV. Your health care provider may recommend a prescription medicine to help prevent HIV infection. If you choose to take medicine to prevent HIV, you should first get tested for HIV. You should then be tested every 3 months for as long as you are taking the medicine. Pregnancy  If you are about to stop having your period (premenopausal) and  you may become pregnant, seek counseling before you get pregnant.  Take 400 to 800 micrograms (mcg) of folic acid every day if you become pregnant.  Ask for birth control (contraception) if you want to prevent pregnancy. Osteoporosis and menopause Osteoporosis is a disease in which the bones lose minerals and strength with aging. This can result in bone fractures. If you are 65 years old or older, or if you are at risk for osteoporosis and fractures, ask your health care provider if you should:  Be screened for bone loss.  Take a calcium or vitamin D supplement to lower your risk of fractures.  Be given hormone replacement therapy (HRT) to treat symptoms of menopause. Follow these instructions at home: Lifestyle  Do not use any products that contain nicotine or tobacco, such as cigarettes, e-cigarettes, and chewing tobacco. If you need help quitting, ask your health care provider.  Do not use street drugs.  Do not share needles.  Ask your health care provider for help if you need support or information about quitting drugs. Alcohol use  Do not drink alcohol if: ? Your health care provider tells you not to drink. ? You are pregnant, may be pregnant, or are planning to become pregnant.  If you drink alcohol: ? Limit how much you use to 0-1 drink a day. ? Limit intake if you are breastfeeding.  Be aware of how much alcohol is in your drink. In the [place] one drink equals one 12 oz bottle of beer (355 mL), one 5 oz glass of wine (148 mL), or one 1 oz glass of hard liquor (44 mL). General instructions  Schedule regular health, dental, and eye exams.  Stay current with your vaccines.  Tell your health care provider if: ? You often feel depressed. ? You have ever been abused or do not feel safe at home. Summary  Adopting a healthy lifestyle and getting preventive care are important in promoting health and wellness.  Follow your health care provider's instructions about healthy  diet, exercising, and getting tested or screened for diseases.  Follow your health care provider's instructions on monitoring your cholesterol and blood pressure. This information is not intended to replace advice given to you by your health care provider. Make sure you discuss any questions you have with your health care provider. Document Released: 11/10/2010 Document Revised: 04/20/2018 Document Reviewed: 04/20/2018 Elsevier Patient Education  2020 [employer]  

## 2019-03-21 MED ORDER — CHOLECALCIFEROL 50 MCG (2000 UT) PO TABS: 1.0000 | ORAL_TABLET | Freq: Every day | ORAL | 1 refills | Status: DC

## 2019-03-22 ENCOUNTER — Encounter: Payer: Self-pay | Admitting: Internal Medicine

## 2019-04-29 ENCOUNTER — Other Ambulatory Visit: Payer: Self-pay | Admitting: Internal Medicine

## 2019-04-29 DIAGNOSIS — E559 Vitamin D deficiency, unspecified: Secondary | ICD-10-CM

## 2019-05-25 ENCOUNTER — Ambulatory Visit: Payer: No Typology Code available for payment source

## 2019-05-30 ENCOUNTER — Ambulatory Visit
Admission: RE | Admit: 2019-05-30 | Discharge: 2019-05-30 | Disposition: A | Discharge status: Home / Self Care | Payer: No Typology Code available for payment source | Source: Ambulatory Visit | Attending: Internal Medicine | Admitting: Internal Medicine

## 2019-05-30 ENCOUNTER — Other Ambulatory Visit: Payer: Self-pay

## 2019-05-30 DIAGNOSIS — Z1231 Encounter for screening mammogram for malignant neoplasm of breast: Secondary | ICD-10-CM

## 2019-05-31 LAB — HM MAMMOGRAPHY

## 2019-06-14 ENCOUNTER — Ambulatory Visit: Payer: No Typology Code available for payment source | Admitting: Obstetrics and Gynecology

## 2019-06-16 ENCOUNTER — Other Ambulatory Visit: Payer: Self-pay

## 2019-06-19 ENCOUNTER — Encounter: Payer: Self-pay | Admitting: Obstetrics and Gynecology

## 2019-06-19 ENCOUNTER — Ambulatory Visit: Payer: No Typology Code available for payment source | Admitting: Obstetrics and Gynecology

## 2019-06-19 ENCOUNTER — Other Ambulatory Visit: Payer: Self-pay

## 2019-06-19 ENCOUNTER — Ambulatory Visit (INDEPENDENT_AMBULATORY_CARE_PROVIDER_SITE_OTHER): Payer: No Typology Code available for payment source | Admitting: Obstetrics and Gynecology

## 2019-06-19 VITALS — BP 134/74 | Ht 64.0 in | Wt 218.0 lb

## 2019-06-19 DIAGNOSIS — Z124 Encounter for screening for malignant neoplasm of cervix: Secondary | ICD-10-CM

## 2019-06-19 DIAGNOSIS — Z01411 Encounter for gynecological examination (general) (routine) with abnormal findings: Secondary | ICD-10-CM

## 2019-06-19 DIAGNOSIS — R102 Pelvic and perineal pain: Secondary | ICD-10-CM | POA: Diagnosis not present

## 2019-06-19 DIAGNOSIS — D259 Leiomyoma of uterus, unspecified: Secondary | ICD-10-CM | POA: Diagnosis not present

## 2019-06-19 DIAGNOSIS — Z1151 Encounter for screening for human papillomavirus (HPV): Secondary | ICD-10-CM

## 2019-06-19 DIAGNOSIS — Z01419 Encounter for gynecological examination (general) (routine) without abnormal findings: Secondary | ICD-10-CM

## 2019-06-19 LAB — HM PAP SMEAR

## 2019-06-19 NOTE — Addendum Note (Signed)
Addended by: Nelva Nay on: 06/19/2019 04:06 PM   Modules accepted: Orders

## 2019-06-19 NOTE — Progress Notes (Signed)
Betty Bell [birth year] [id number]  SUBJECTIVE:  59 y.o. G1P0010 female for annual routine gynecologic exam and Pap smear. She does have some pelvic pain occasionally, a little bit more on the left than the right.  She does have a known history of a fibroid uterus.  She denies any vaginal bleeding or abnormal discharge. Current Outpatient Medications  Medication Sig Dispense Refill  . cetirizine (ZYRTEC) 10 MG tablet Take 10 mg by mouth daily.    . Cholecalciferol 50 MCG (2000 UT) TABS Take 1 tablet (2,000 Units total) by mouth daily. 90 tablet 1  . famotidine (PEPCID) 20 MG tablet Take 2 tablets (40 mg total) by mouth 2 (two) times daily. 60 tablet 3  . indapamide (LOZOL) 1.25 MG tablet Take 1 tablet (1.25 mg total) by mouth daily. 90 tablet 1   No current facility-administered medications for this visit.   Allergies: Aspirin, Cephalosporins, Ciprofloxacin, Fish allergy, Nitrofurantoin monohyd macro, Penicillins, Protonix [pantoprazole sodium], and Sulfa antibiotics  Patient's last menstrual period was 03/26/2011.  Past medical history,surgical history, problem list, medications, allergies, family history and social history were all reviewed and documented as reviewed in the EPIC chart.  ROS:  Feeling well. No dyspnea or chest pain on exertion.  No abdominal pain, change in bowel habits, black or bloody stools.  No urinary tract symptoms. GYN ROS: no abnormal bleeding, occasional pelvic pain, no discharge, no breast pain or new or enlarging lumps on self exam. No neurological complaints.   OBJECTIVE:  BP 134/74   Ht 5\' 4"  (1.626 m)   Wt 218 lb (98.9 kg)   LMP 03/26/2011   BMI 37.42 kg/m  The patient appears well, alert, oriented x 3, in no distress. ENT normal.  Neck supple. No cervical or supraclavicular adenopathy or thyromegaly.  Lungs are clear, good air entry, no wheezes, rhonchi or rales. S1 and S2 normal, no murmurs, regular rate and rhythm.  Abdomen soft without  tenderness, guarding, mass or organomegaly.  Neurological is normal, no focal findings.  BREAST EXAM: breasts appear normal, no suspicious masses, no skin or nipple changes or axillary nodes  PELVIC EXAM: VULVA: normal appearing vulva with no masses, tenderness or lesions, VAGINA: normal appearing vagina with normal color and discharge, no lesions, CERVIX: normal appearing cervix without discharge or lesions, UTERUS: Uterus anteverted and enlarged, 8 to 10 weeks size, palpable fibroids, mildly tender to deep palpation, mobile ADNEXA: normal adnexa in size, nontender and no masses, but exam is somewhat limited by body habitus   Chaperone: Caryn Bee present during the examination  ASSESSMENT:  59 y.o. G1P0010 here for annual gynecologic exam  PLAN:   1.  Postmenopausal.  Mild vaginal dryness causing some dyspareunia.  She is using lubricants.  Not overly bothered with some symptom for her at this time.  No significant hot flashes. 2.  Pelvic pain with history of fibroid uterus.  Also a history of right hydrosalpinx.  Prior fibroid embolization per the record.  She has had some tenderness with palpation over the uterus but it is difficult to discern if she has any adnexal cysts.  I recommended getting a pelvic ultrasound, which is ordered. 3. Pap smear 2017. Pap smear is repeated today.  4. Mammogram recently done. Will continue with annual mammography. Breast exam normal today. 5. Colonoscopy 2015. Recommended that she continue per the prescribed interval. 6. DEXA indicated before age 31.  Will discuss more in depth at upcoming appointments when further into menopause. 7. Health maintenance.  No  lab work as she has this completed with her primary care provider, who manages her other medical conditions including HTN.  Return annually or sooner, prn.  Joseph Pierini MD  06/19/19

## 2019-06-19 NOTE — Patient Instructions (Signed)
Please remember to schedule a pelvic ultrasound to evaluate ovaries/fibroids when able

## 2019-06-20 LAB — PAP IG AND HPV HIGH-RISK: HPV DNA High Risk: NOT DETECTED

## 2019-07-05 ENCOUNTER — Other Ambulatory Visit: Payer: Self-pay

## 2019-07-06 ENCOUNTER — Encounter: Payer: Self-pay | Admitting: Obstetrics and Gynecology

## 2019-07-06 ENCOUNTER — Ambulatory Visit (INDEPENDENT_AMBULATORY_CARE_PROVIDER_SITE_OTHER): Payer: No Typology Code available for payment source

## 2019-07-06 ENCOUNTER — Ambulatory Visit: Payer: No Typology Code available for payment source | Admitting: Obstetrics and Gynecology

## 2019-07-06 VITALS — BP 136/86

## 2019-07-06 DIAGNOSIS — R102 Pelvic and perineal pain: Secondary | ICD-10-CM | POA: Diagnosis not present

## 2019-07-06 DIAGNOSIS — D259 Leiomyoma of uterus, unspecified: Secondary | ICD-10-CM | POA: Diagnosis not present

## 2019-07-06 DIAGNOSIS — Z01411 Encounter for gynecological examination (general) (routine) with abnormal findings: Secondary | ICD-10-CM

## 2019-07-06 DIAGNOSIS — R1010 Upper abdominal pain, unspecified: Secondary | ICD-10-CM | POA: Diagnosis not present

## 2019-07-06 NOTE — Progress Notes (Signed)
   Betty Bell  09-15-1960 [id number]  HPI The patient is a 59 y.o. G1P0010 who presents today for go show her ultrasound results for work-up of abdominal and pelvic pain.  Since our last meeting on 06/19/2019, she says the pain is mildly better and really more upper abdominal.  She does report a history of ulcers for which she takes antiacid treatment.    Past medical history,surgical history, problem list, medications, allergies, family history and social history were all reviewed and documented as reviewed in the EPIC chart.  Physical Exam  BP 136/86   LMP 03/26/2011   General: Pleasant female, no acute distress, alert and oriented PELVIC EXAM: Deferred as this was performed recently  Pelvic ultrasound Anteverted uterus 6.8 x 4.3 x 3.2 cm Normal size and shape Several intramural fibroids, the largest of which measures 2.1 cm, centrally located Endometrial cavity distorted by the central fibroid No obvious masses or thickening within the endometrial cavity Bilateral ovaries are small and atrophic Right ovary with a single 6 mm simple follicle Left ovary with single 5 mm simple follicle No adnexal masses No free fluid  Assessment 59 yo G1P0010 with abdominal pain, small bilateral ovarian follicles, several small fibroids  Plan We reviewed the results of her imaging and my opinion is that her pain is unlikely to be related to her small fibroids in her uterus or her tiny cyst on her bilateral ovaries.  These are so small that they did not need follow-up.  Sometimes when fibroids involute they can cause pain so that would be potentially a cause of a midline low pelvic pain, but she describes her pain as more upper abdominal, so if this is getting worse I would recommend reconnecting with her GI specialist given her history.  She is in agreement with this plan and has all of her questions answered to her satisfaction today.   Joseph Pierini MD 07/06/19

## 2019-07-31 ENCOUNTER — Ambulatory Visit (INDEPENDENT_AMBULATORY_CARE_PROVIDER_SITE_OTHER): Payer: No Typology Code available for payment source | Admitting: Family

## 2019-07-31 DIAGNOSIS — J209 Acute bronchitis, unspecified: Secondary | ICD-10-CM | POA: Diagnosis not present

## 2019-07-31 DIAGNOSIS — J4520 Mild intermittent asthma, uncomplicated: Secondary | ICD-10-CM | POA: Diagnosis not present

## 2019-07-31 MED ORDER — AZITHROMYCIN 250 MG PO TABS: ORAL_TABLET | ORAL | 0 refills | Status: DC

## 2019-07-31 MED ORDER — PREDNISONE 20 MG PO TABS: 40.0000 mg | ORAL_TABLET | Freq: Every day | ORAL | 0 refills | Status: DC

## 2019-07-31 NOTE — Progress Notes (Signed)
Betty Bell is a 59 y.o. female with the following history as recorded in [employer]:  Patient Active Problem List   Diagnosis Date Noted  . Cervical cancer screening 03/20/2019  . Vitamin D deficiency disease 01/19/2019  . Lung nodule, solitary 05/28/2017  . Mild intermittent asthma without complication [id number]  . Obesity (BMI 35.0-39.9 without comorbidity) 12/13/2014  . Essential hypertension 07/13/2014  . Intramural leiomyoma of uterus 06/19/2014  . Visit for screening mammogram 11/16/2013  . Allergic rhinosinusitis   . Routine general medical examination at a health care facility 03/30/2011  . GERD 01/26/2008    Current Outpatient Medications  Medication Sig Dispense Refill  . azithromycin (ZITHROMAX) 250 MG tablet 2 tabs po qd x 1 day; 1 tablet per day x 4 days; 6 tablet 0  . cetirizine (ZYRTEC) 10 MG tablet Take 10 mg by mouth daily.    . Cholecalciferol 50 MCG (2000 UT) TABS Take 1 tablet (2,000 Units total) by mouth daily. 90 tablet 1  . famotidine (PEPCID) 20 MG tablet Take 2 tablets (40 mg total) by mouth 2 (two) times daily. 60 tablet 3  . indapamide (LOZOL) 1.25 MG tablet Take 1 tablet (1.25 mg total) by mouth daily. 90 tablet 1  . predniSONE (DELTASONE) 20 MG tablet Take 2 tablets (40 mg total) by mouth daily with breakfast. 10 tablet 0   No current facility-administered medications for this visit.    Allergies: Aspirin, Cephalosporins, Ciprofloxacin, Fish allergy, Nitrofurantoin monohyd macro, Penicillins, Protonix [pantoprazole sodium], and Sulfa antibiotics  Past Medical History:  Diagnosis Date  . Anxiety   . Asthma   . Cervical dysplasia    High Grade  . CIN I (cervical intraepithelial neoplasia I)   . Colon polyp 2015   TUBULAR ADENOMA  . Endometrial polyp   . GERD (gastroesophageal reflux disease)   . Hydrosalpinx    Left and Right  . Hypertension   . IBS (irritable bowel syndrome)   . Rectal burning   . Seasonal allergies   . Uterine  fibroid     Past Surgical History:  Procedure Laterality Date  . CARPAL TUNNEL RELEASE Left    x 2  . COLONOSCOPY  2015  . DILATION AND CURETTAGE OF UTERUS    . HYSTEROSCOPY    . LASER ABLATION OF THE CERVIX    . PELVIC LAPAROSCOPY     DL  . UTERINE FIBROID EMBOLIZATION    . wrist cystectomy Right     Family History  Problem Relation Age of Onset  . Diabetes Mother   . Hypertension Mother   . Diabetes Father   . Hypertension Father   . Prostate cancer Father   . Colon cancer Father 60  . Colon cancer Brother 60  . Cancer Brother        esophagus, skin  . Colon polyps Sister   . Hypertension Sister   . Lung cancer Brother   . Cancer - Other Brother        esophagal  . Kidney disease Other   . Irritable bowel syndrome Sister   . COPD Neg Hx   . Alcohol abuse Neg Hx   . Depression Neg Hx   . Drug abuse Neg Hx   . Early death Neg Hx   . Hearing loss Neg Hx   . Heart disease Neg Hx   . Hyperlipidemia Neg Hx   . Stroke Neg Hx     Social History   Tobacco Use  . Smoking status:  Never Smoker  . Smokeless tobacco: Never Used  . Tobacco comment: Regular Exercise - Yes  Substance Use Topics  . Alcohol use: No    Alcohol/week: 0.0 standard drinks    Subjective:   I connected with Betty Bell on 07/31/19 at  1:40 PM EDT by a video enabled telemedicine application and verified that I am speaking with the correct person using two identifiers.   I discussed the limitations of evaluation and management by telemedicine and the availability of in person appointments. The patient expressed understanding and agreed to proceed. Provider in office/ patient is at home; provider and patient are only 2 people on video call.   Concern for bronchitis; notes that has seasonal allergies and symptoms flare every year this time of year; has been having increased coughing/ occasional wheeze; denies any chest pain or shortness of breath; denies any concerns for COVID exposure and  does not want to consider testing at this time;   Objective:  There were no vitals filed for this visit.  General: Well developed, well nourished, in no acute distress  Skin : Warm and dry.  Head: Normocephalic and atraumatic  Lungs: Respirations unlabored; did hear mild wheeze with coughing during course of visit; Neurologic: Alert and oriented; speech intact; face symmetrical;   Assessment:  1. Acute bronchitis, unspecified organism   2. Mild intermittent asthma without complication     Plan:  Will treat with Zithromax and oral prednisone; if no improvement in 24-48 hours, she will get COVID testing done; increase fluids, rest and follow-up worse, no better.   No follow-ups on file.  No orders of the defined types were placed in this encounter.   Requested Prescriptions   Signed Prescriptions Disp Refills  . azithromycin (ZITHROMAX) 250 MG tablet 6 tablet 0    Sig: 2 tabs po qd x 1 day; 1 tablet per day x 4 days;  . predniSONE (DELTASONE) 20 MG tablet 10 tablet 0    Sig: Take 2 tablets (40 mg total) by mouth daily with breakfast.

## 2019-09-05 ENCOUNTER — Other Ambulatory Visit: Payer: Self-pay

## 2019-09-06 ENCOUNTER — Ambulatory Visit (INDEPENDENT_AMBULATORY_CARE_PROVIDER_SITE_OTHER): Payer: No Typology Code available for payment source | Admitting: Nurse Practitioner

## 2019-09-06 ENCOUNTER — Encounter: Payer: Self-pay | Admitting: Nurse Practitioner

## 2019-09-06 VITALS — BP 160/80

## 2019-09-06 DIAGNOSIS — R1013 Epigastric pain: Secondary | ICD-10-CM

## 2019-09-06 DIAGNOSIS — N898 Other specified noninflammatory disorders of vagina: Secondary | ICD-10-CM

## 2019-09-06 DIAGNOSIS — M545 Low back pain, unspecified: Secondary | ICD-10-CM

## 2019-09-06 LAB — WET PREP FOR TRICH, YEAST, CLUE

## 2019-09-06 NOTE — Progress Notes (Signed)
   Acute Office Visit  Subjective:    Patient ID: Betty Bell, female    DOB: 1960-11-30, 59 y.o.   MRN: [id number]  Chief Complaint  Patient presents with  . Back Pain    jk  . Abdominal Pain    HPI 59 y.o. BF G1P0 Presents today for bilateral lower back pain that began 2 weeks ago. Describes as aching after being on feet for long periods, rest helps, no trauma or injury reported. Abdominal pain began about 1 week ago, it is mostly epigastric but periumbilical at times. History of GERD and stomach ulcers in the past-taking Pepcid daily, GI manages this. Most recent endoscopy 02/28/2018 showed a non-bleeding gastric ulcer and multiple, localized nonbleeding erosions in gastric antrum. Denies nausea, vomiting, diarrhea, or bloody stools or emesis. Denies dysuria, hematuria, vaginal bleeding, odor, or discharge. Does c/o vaginal itching. Postmenopausal with vaginal dryness. Ultrasound 07/06/2019 showed several intramural fibroids with the largest measuring 2.1cm, centrally located, small bilateral cysts on ovaries.  Has had weight gain in the past year. Not currently sexually active.   Review of Systems  Constitutional: Negative for fever.  Gastrointestinal: Positive for abdominal pain (epigastic, periumbilical). Negative for abdominal distention, blood in stool, constipation, diarrhea, nausea and vomiting.  Genitourinary: Negative for dysuria, flank pain, frequency, hematuria, pelvic pain, urgency, vaginal bleeding and vaginal discharge. Vaginal pain: vaginal itching.  Musculoskeletal: Positive for back pain (bilateral lower with standing ).       Objective:    Physical Exam Constitutional:      Appearance: She is obese.  Cardiovascular:     Rate and Rhythm: Normal rate and regular rhythm.  Pulmonary:     Effort: Pulmonary effort is normal.     Breath sounds: Normal breath sounds.  Abdominal:     General: There is no distension.     Palpations: Abdomen is soft.   Tenderness: There is abdominal tenderness in the epigastric area and periumbilical area. There is guarding (with palpation). There is no right CVA tenderness or left CVA tenderness.  Neurological:     Mental Status: She is alert.   UA negative Wet prep negative  BP (!) 160/80 (BP Location: Right Arm, Patient Position: Sitting, Cuff Size: Normal)   LMP 03/26/2011  Wt Readings from Last 3 Encounters:  06/19/19 218 lb (98.9 kg)  03/20/19 213 lb (96.6 kg)  01/30/19 212 lb (96.2 kg)        Assessment & Plan:   Problem List Items Addressed This Visit    None    Visit Diagnoses    Acute bilateral low back pain without sciatica    -  Primary   Relevant Orders   Urinalysis,Complete w/RFL Culture (Completed)   REFLEXIVE URINE CULTURE (Completed)   Epigastric pain       Vaginal itching       Vaginal discharge       Relevant Orders   WET PREP FOR [place], YEAST, CLUE (Completed)     Plan: Instructed to follow up with GI for upper abdominal pain given her history. Lower midline abdominal pain could be from fibroids and smalls cysts on her ovaries but are so small they do not require follow up at this time. Discussed increased weight may be contributing to back pain and to take tylenol as needed-avoid NSAIDS due to history of ulcers. She is agreeable to plan.       Tamela Gammon [facility], 8:31 PM 09/06/2019

## 2019-09-07 LAB — URINALYSIS, COMPLETE W/RFL CULTURE

## 2019-09-07 LAB — NO CULTURE INDICATED

## 2019-09-09 LAB — URINALYSIS, COMPLETE W/RFL CULTURE
Bilirubin Urine: NEGATIVE
Glucose, UA: NEGATIVE
Hyaline Cast: NONE SEEN /LPF
Ketones, ur: NEGATIVE
Leukocyte Esterase: NEGATIVE
Nitrites, Initial: NEGATIVE
Protein, ur: NEGATIVE
Specific Gravity, Urine: 1.015 (ref 1.001–1.03)
pH: 5.5 (ref 5.0–8.0)

## 2019-09-09 LAB — CULTURE INDICATED

## 2019-09-09 LAB — URINE CULTURE
MICRO NUMBER:: 10415844
SPECIMEN QUALITY:: ADEQUATE

## 2019-10-19 ENCOUNTER — Ambulatory Visit: Payer: No Typology Code available for payment source | Attending: Internal Medicine

## 2019-10-19 DIAGNOSIS — Z23 Encounter for immunization: Secondary | ICD-10-CM

## 2019-10-19 NOTE — Progress Notes (Signed)
   Covid-19 Vaccination Clinic  Name:  Betty Bell    MRN: [id number] DOB: [birth year]  10/19/2019  [name] was observed post Covid-19 immunization for 15 minutes without incident. She was provided with Vaccine Information Sheet and instruction to access the V-Safe system.   [name] was instructed to call 911 with any severe reactions post vaccine: Marland Kitchen Difficulty breathing  . Swelling of face and throat  . A fast heartbeat  . A bad rash all over body  . Dizziness and weakness   Immunizations Administered    Name Date Dose VIS Date Route   Pfizer COVID-19 Vaccine 10/19/2019  3:51 PM 0.3 mL 07/05/2018 Intramuscular   Manufacturer: [employer], [employer]   Lot: [id number]   [place]: [id number]

## 2019-11-10 ENCOUNTER — Ambulatory Visit (INDEPENDENT_AMBULATORY_CARE_PROVIDER_SITE_OTHER): Payer: No Typology Code available for payment source | Admitting: Family

## 2019-11-10 ENCOUNTER — Other Ambulatory Visit: Payer: Self-pay

## 2019-11-10 ENCOUNTER — Encounter: Payer: Self-pay | Admitting: Family

## 2019-11-10 ENCOUNTER — Ambulatory Visit (INDEPENDENT_AMBULATORY_CARE_PROVIDER_SITE_OTHER)
Admission: RE | Admit: 2019-11-10 | Discharge: 2019-11-10 | Disposition: A | Discharge status: Home / Self Care | Payer: No Typology Code available for payment source | Source: Ambulatory Visit | Attending: Family | Admitting: Family

## 2019-11-10 VITALS — BP 144/70 | HR 93 | Temp 98.3°F | Ht 64.0 in | Wt 223.0 lb

## 2019-11-10 DIAGNOSIS — M5442 Lumbago with sciatica, left side: Secondary | ICD-10-CM | POA: Diagnosis not present

## 2019-11-10 DIAGNOSIS — R252 Cramp and spasm: Secondary | ICD-10-CM

## 2019-11-10 LAB — MAGNESIUM: Magnesium: 2.2 mg/dL (ref 1.5–2.5)

## 2019-11-10 LAB — COMPREHENSIVE METABOLIC PANEL
ALT: 13 U/L (ref 0–35)
AST: 18 U/L (ref 0–37)
Albumin: 4.3 g/dL (ref 3.5–5.2)
Alkaline Phosphatase: 77 U/L (ref 39–117)
BUN: 10 mg/dL (ref 6–23)
CO2: 29 mEq/L (ref 19–32)
Calcium: 9.6 mg/dL (ref 8.4–10.5)
Chloride: 104 mEq/L (ref 96–112)
Creatinine, Ser: 0.64 mg/dL (ref 0.40–1.20)
GFR: 114.68 mL/min (ref >60.00)
Glucose, Bld: 123 mg/dL — ABNORMAL HIGH (ref 70–99)
Potassium: 3.5 mEq/L (ref 3.5–5.1)
Sodium: 141 mEq/L (ref 135–145)
Total Bilirubin: 0.3 mg/dL (ref 0.2–1.2)
Total Protein: 7.4 g/dL (ref 6.0–8.3)

## 2019-11-10 LAB — CBC WITH DIFFERENTIAL/PLATELET
Basophils Absolute: 0.1 10*3/uL (ref 0.0–0.1)
Basophils Relative: 1 % (ref 0.0–3.0)
Eosinophils Absolute: 0.1 10*3/uL (ref 0.0–0.7)
Eosinophils Relative: 1 % (ref 0.0–5.0)
HCT: 39.8 % (ref 36.0–46.0)
Hemoglobin: 13 g/dL (ref 12.0–15.0)
Lymphocytes Relative: 28.2 % (ref 12.0–46.0)
Lymphs Abs: 2.3 10*3/uL (ref 0.7–4.0)
MCHC: 32.7 g/dL (ref 30.0–36.0)
MCV: 84.4 fl (ref 78.0–100.0)
Monocytes Absolute: 0.8 10*3/uL (ref 0.1–1.0)
Monocytes Relative: 9.2 % (ref 3.0–12.0)
Neutro Abs: 5 10*3/uL (ref 1.4–7.7)
Neutrophils Relative %: 60.6 % (ref 43.0–77.0)
Platelets: 275 10*3/uL (ref 150.0–400.0)
RBC: 4.72 Mil/uL (ref 3.87–5.11)
RDW: 14.3 % (ref 11.5–15.5)
WBC: 8.3 10*3/uL (ref 4.0–10.5)

## 2019-11-10 LAB — VITAMIN B12: Vitamin B-12: 161 pg/mL — ABNORMAL LOW (ref 211–911)

## 2019-11-10 MED ORDER — METHOCARBAMOL 500 MG PO TABS: 500.0000 mg | ORAL_TABLET | Freq: Three times a day (TID) | ORAL | 0 refills | Status: DC | PRN

## 2019-11-10 NOTE — Progress Notes (Signed)
Betty Bell is a 59 y.o. female with the following history as recorded in EpicCare:  Patient Active Problem List   Diagnosis Date Noted  . Cervical cancer screening 03/20/2019  . Vitamin D deficiency disease 01/19/2019  . Lung nodule, solitary 05/28/2017  . Mild intermittent asthma without complication 14/48/1856  . Obesity (BMI 35.0-39.9 without comorbidity) 12/13/2014  . Essential hypertension 07/13/2014  . Intramural leiomyoma of uterus 06/19/2014  . Visit for screening mammogram 11/16/2013  . Allergic rhinosinusitis   . Routine general medical examination at a health care facility 03/30/2011  . GERD 01/26/2008    Current Outpatient Medications  Medication Sig Dispense Refill  . cetirizine (ZYRTEC) 10 MG tablet Take 10 mg by mouth daily.    . Cholecalciferol 50 MCG (2000 UT) TABS Take 1 tablet (2,000 Units total) by mouth daily. 90 tablet 1  . famotidine (PEPCID) 20 MG tablet Take 2 tablets (40 mg total) by mouth 2 (two) times daily. 60 tablet 3  . indapamide (LOZOL) 1.25 MG tablet Take 1 tablet (1.25 mg total) by mouth daily. 90 tablet 1  . methocarbamol (ROBAXIN) 500 MG tablet Take 1 tablet (500 mg total) by mouth every 8 (eight) hours as needed. 30 tablet 0   No current facility-administered medications for this visit.    Allergies: Aspirin, Cephalosporins, Ciprofloxacin, Fish allergy, Nitrofurantoin monohyd macro, Penicillins, Protonix [pantoprazole sodium], and Sulfa antibiotics  Past Medical History:  Diagnosis Date  . Anxiety   . Asthma   . Cervical dysplasia    High Grade  . CIN I (cervical intraepithelial neoplasia I)   . Colon polyp 2015   TUBULAR ADENOMA  . Endometrial polyp   . GERD (gastroesophageal reflux disease)   . Hydrosalpinx    Left and Right  . Hypertension   . IBS (irritable bowel syndrome)   . Rectal burning   . Seasonal allergies   . Uterine fibroid     Past Surgical History:  Procedure Laterality Date  . CARPAL TUNNEL RELEASE Left     x 2  . COLONOSCOPY  2015  . DILATION AND CURETTAGE OF UTERUS    . HYSTEROSCOPY    . LASER ABLATION OF THE CERVIX    . PELVIC LAPAROSCOPY     DL  . UTERINE FIBROID EMBOLIZATION    . wrist cystectomy Right     Family History  Problem Relation Age of Onset  . Diabetes Mother   . Hypertension Mother   . Diabetes Father   . Hypertension Father   . Prostate cancer Father   . Colon cancer Father 4  . Colon cancer Brother 53  . Cancer Brother        esophagus, skin  . Colon polyps Sister   . Hypertension Sister   . Lung cancer Brother   . Cancer - Other Brother        esophagal  . Kidney disease Other   . Irritable bowel syndrome Sister   . COPD Neg Hx   . Alcohol abuse Neg Hx   . Depression Neg Hx   . Drug abuse Neg Hx   . Early death Neg Hx   . Hearing loss Neg Hx   . Heart disease Neg Hx   . Hyperlipidemia Neg Hx   . Stroke Neg Hx     Social History   Tobacco Use  . Smoking status: Never Smoker  . Smokeless tobacco: Never Used  . Tobacco comment: Regular Exercise - Yes  Substance Use Topics  .  Alcohol use: No    Alcohol/week: 0.0 standard drinks    Subjective:  Presents with concerns for leg cramps on and off for the past week; also feels like the pain is running from her low back into her left thigh- the leg "feels heavy." No known injury or trauma to the leg or back; no swelling in calf; no recent travel; no shortness of breath.   Objective:  Vitals:   11/10/19 1515  BP: (!) 144/70  Pulse: 93  Temp: 98.3 F (36.8 C)  TempSrc: Oral  SpO2: 98%  Weight: 223 lb (101.2 kg)  Height: _0  (1.626 m)    General: Well developed, well nourished, in no acute distress  Skin : Warm and dry.  Head: Normocephalic and atraumatic  Lungs: Respirations unlabored; clear to auscultation bilaterally without wheeze, rales, rhonchi  CVS exam: normal rate and regular rhythm.  Abdomen: Soft; nontender; nondistended; normoactive bowel sounds; no masses or hepatosplenomegaly   Musculoskeletal: No deformities; no active joint inflammation  Extremities: No edema, cyanosis, clubbing  Vessels: Symmetric bilaterally  Neurologic: Alert and oriented; speech intact; face symmetrical; moves all extremities well; CNII-XII intact without focal deficit   Assessment:  1. Acute left-sided low back pain with left-sided sciatica   2. Leg cramps     Plan:  1. Update lumbar X-ray today; Rx for Robaxin 500 mg tid prn; follow-up to be determined; 2. ? Related to indapamide of sciatica; update labs- follow-up to be determined.   This visit occurred during the SARS-CoV-2 public health emergency.  Safety protocols were in place, including screening questions prior to the visit, additional usage of staff PPE, and extensive cleaning of exam room while observing appropriate contact time as indicated for disinfecting solutions.     No follow-ups on file.  Orders Placed This Encounter  Procedures  . DG Lumbar Spine 2-3 Views    Standing Status:   Future    Number of Occurrences:   1    Standing Expiration Date:   11/09/2020    Order Specific Question:   Reason for Exam (SYMPTOM  OR DIAGNOSIS REQUIRED)    Answer:   low back pain with radiating symptoms into left leg    Order Specific Question:   Is patient pregnant?    Answer:   No    Order Specific Question:   Preferred imaging location?    Answer:   [facility]    Order Specific Question:   Radiology Contrast Protocol - do NOT remove file path    Answer:   [email or web address]  . CBC with Differential/Platelet  . Comp Met (CMET)  . Magnesium  . B12  . D-Dimer, Quantitative    Requested Prescriptions   Signed Prescriptions Disp Refills  . methocarbamol (ROBAXIN) 500 MG tablet 30 tablet 0    Sig: Take 1 tablet (500 mg total) by mouth every 8 (eight) hours as needed.

## 2019-11-11 LAB — D-DIMER, QUANTITATIVE: D-Dimer, Quant: 0.53 mcg/mL FEU — ABNORMAL HIGH (ref <0.50)

## 2019-11-14 ENCOUNTER — Other Ambulatory Visit: Payer: Self-pay | Admitting: Family

## 2019-11-14 DIAGNOSIS — G8929 Other chronic pain: Secondary | ICD-10-CM

## 2019-11-14 DIAGNOSIS — M545 Low back pain, unspecified: Secondary | ICD-10-CM

## 2019-11-14 NOTE — Progress Notes (Signed)
Reviewed with patient; will refer to orthopedist;

## 2019-11-16 ENCOUNTER — Ambulatory Visit (INDEPENDENT_AMBULATORY_CARE_PROVIDER_SITE_OTHER): Payer: No Typology Code available for payment source

## 2019-11-16 ENCOUNTER — Other Ambulatory Visit: Payer: Self-pay

## 2019-11-16 DIAGNOSIS — E538 Deficiency of other specified B group vitamins: Secondary | ICD-10-CM

## 2019-11-16 MED ORDER — CYANOCOBALAMIN 1000 MCG/ML IJ SOLN
1000.0000 ug | Freq: Once | INTRAMUSCULAR | Status: AC
  Administered 2019-11-16: 1000 ug via INTRAMUSCULAR

## 2019-11-16 NOTE — Progress Notes (Signed)
I have reviewed and agree.

## 2019-11-16 NOTE — Progress Notes (Signed)
Pt here for monthly B12 injection per L Murray note.  B12 1072mcg given IM left deltoid and pt tolerated injection well.  Next B12 injection scheduled for 11/23/19

## 2019-11-23 ENCOUNTER — Ambulatory Visit (INDEPENDENT_AMBULATORY_CARE_PROVIDER_SITE_OTHER): Payer: No Typology Code available for payment source | Admitting: *Deleted

## 2019-11-23 ENCOUNTER — Other Ambulatory Visit: Payer: Self-pay

## 2019-11-23 ENCOUNTER — Ambulatory Visit: Payer: No Typology Code available for payment source | Admitting: Gastroenterology

## 2019-11-23 DIAGNOSIS — E538 Deficiency of other specified B group vitamins: Secondary | ICD-10-CM | POA: Diagnosis not present

## 2019-11-23 MED ORDER — CYANOCOBALAMIN 1000 MCG/ML IJ SOLN
1000.0000 ug | Freq: Once | INTRAMUSCULAR | Status: AC
  Administered 2019-11-23: 1000 ug via INTRAMUSCULAR

## 2019-11-23 NOTE — Progress Notes (Signed)
Pls cosign for B12 inj../lmb  

## 2019-11-30 ENCOUNTER — Ambulatory Visit (INDEPENDENT_AMBULATORY_CARE_PROVIDER_SITE_OTHER): Payer: No Typology Code available for payment source | Admitting: *Deleted

## 2019-11-30 ENCOUNTER — Other Ambulatory Visit: Payer: Self-pay

## 2019-11-30 DIAGNOSIS — E538 Deficiency of other specified B group vitamins: Secondary | ICD-10-CM

## 2019-11-30 MED ORDER — CYANOCOBALAMIN 1000 MCG/ML IJ SOLN
1000.0000 ug | Freq: Once | INTRAMUSCULAR | Status: AC
  Administered 2019-11-30: 1000 ug via INTRAMUSCULAR

## 2019-11-30 NOTE — Progress Notes (Signed)
Pls cosign for B12 inj../lmb  

## 2019-12-07 ENCOUNTER — Ambulatory Visit (INDEPENDENT_AMBULATORY_CARE_PROVIDER_SITE_OTHER): Payer: No Typology Code available for payment source | Admitting: *Deleted

## 2019-12-07 ENCOUNTER — Other Ambulatory Visit: Payer: Self-pay

## 2019-12-07 DIAGNOSIS — E538 Deficiency of other specified B group vitamins: Secondary | ICD-10-CM | POA: Diagnosis not present

## 2019-12-07 MED ORDER — CYANOCOBALAMIN 1000 MCG/ML IJ SOLN
1000.0000 ug | Freq: Once | INTRAMUSCULAR | Status: AC
  Administered 2019-12-07: 1000 ug via INTRAMUSCULAR

## 2019-12-07 NOTE — Progress Notes (Signed)
Pls cosign for B12 inj../lmb  

## 2019-12-19 ENCOUNTER — Encounter: Payer: Self-pay | Admitting: Physician Assistant

## 2019-12-19 ENCOUNTER — Ambulatory Visit (INDEPENDENT_AMBULATORY_CARE_PROVIDER_SITE_OTHER): Payer: No Typology Code available for payment source | Admitting: Physician Assistant

## 2019-12-19 VITALS — BP 124/72 | HR 89 | Ht 64.0 in | Wt 223.2 lb

## 2019-12-19 DIAGNOSIS — Z8711 Personal history of peptic ulcer disease: Secondary | ICD-10-CM | POA: Insufficient documentation

## 2019-12-19 DIAGNOSIS — Z860101 Personal history of adenomatous and serrated colon polyps: Secondary | ICD-10-CM | POA: Insufficient documentation

## 2019-12-19 DIAGNOSIS — Z86010 Personal history of colonic polyps: Secondary | ICD-10-CM

## 2019-12-19 DIAGNOSIS — R1013 Epigastric pain: Secondary | ICD-10-CM | POA: Diagnosis not present

## 2019-12-19 DIAGNOSIS — Z800 Family history of malignant neoplasm of digestive organs: Secondary | ICD-10-CM

## 2019-12-19 DIAGNOSIS — Z1211 Encounter for screening for malignant neoplasm of colon: Secondary | ICD-10-CM

## 2019-12-19 DIAGNOSIS — Z8719 Personal history of other diseases of the digestive system: Secondary | ICD-10-CM

## 2019-12-19 MED ORDER — PLENVU 140 G PO SOLR: 1.0000 | ORAL | 0 refills | Status: DC

## 2019-12-19 MED ORDER — SUCRALFATE 1 GM/10ML PO SUSP
1.0000 g | Freq: Three times a day (TID) | ORAL | 1 refills | Status: DC
Start: 2019-12-19 — End: 2019-12-26

## 2019-12-19 NOTE — Progress Notes (Signed)
Subjective:    Patient ID: Betty Bell, female    DOB: 1960-10-03, 59 y.o.   MRN: [id number]  HPI Betty Bell is a pleasant 59 year old African-American female, established with Dr. Tarri Bell.  She was last seen in October 60 2019.  She comes in today with complaints of recurrent upper abdominal pain over the past 4 to 6 weeks. She had undergone EGD in October 2019 for complaints of epigastric pain and was found to have 1 cratered 5 mm gastric ulcer and multiple gastric erosions.  Biopsies were unremarkable and no evidence of H. pylori.  Last colonoscopy was done in 2015 per Dr. Delfin Bell with finding of 1 flat 3 to 7 mm polyp which was a tubular adenoma.  Exam was otherwise negative.  She has family history of colon cancer in her father and a brother and is therefore indicated for 5-year interval follow-up. She also has history of hypertension, GERD and B12 deficiency. She has continued on Pepcid 40 mg twice daily since she was diagnosed with the ulcer and gastritis.  She says she was tried on a couple of different PPIs but could not tolerate and has done well with Pepcid.  Now over the past 4 to 6 weeks has had some mild fairly constant epigastric discomfort which is worse after certain foods.  No associated nausea or vomiting.  Appetite has been okay.  Bowel movements have been normal without melena or hematochezia.  No regular aspirin or NSAID use. Labs done in July 2021 reviewed and showed a normal CBC and CMET.  Review of Systems Pertinent positive and negative review of systems were noted in the above HPI section.  All other review of systems was otherwise negative.  Outpatient Encounter Medications as of 12/19/2019  Medication Sig   acetaminophen (TYLENOL) 500 MG tablet Take 500 mg by mouth every 6 (six) hours as needed.   cetirizine (ZYRTEC) 10 MG tablet Take 10 mg by mouth daily.   Cholecalciferol 50 MCG (2000 UT) TABS Take 1 tablet (2,000 Units total) by mouth daily.   famotidine  (PEPCID) 20 MG tablet Take 2 tablets (40 mg total) by mouth 2 (two) times daily.   indapamide (LOZOL) 1.25 MG tablet Take 1 tablet (1.25 mg total) by mouth daily.   PEG-KCl-NaCl-NaSulf-Na Asc-C (PLENVU) 140 g SOLR Take 1 kit by mouth as directed. BIN: [id number]; GROUP: [id number]; PCN: [id number]; ID: [id number]; SHOULD BE $50   sucralfate (CARAFATE) 1 GM/10ML suspension Take 10 mLs (1 g total) by mouth 4 (four) times daily -  with meals and at bedtime.   [DISCONTINUED] methocarbamol (ROBAXIN) 500 MG tablet Take 1 tablet (500 mg total) by mouth every 8 (eight) hours as needed.   No facility-administered encounter medications on file as of 12/19/2019.   Allergies  Allergen Reactions   Aspirin     REACTION: Swelling   Cephalosporins     Stomach cramps   Ciprofloxacin     Stomach cramps   Fish Allergy     Shortness of breath   Nitrofurantoin Monohyd Macro     Stomach cramps   Penicillins     Per pt: unknown   Protonix [Pantoprazole Sodium]     Respiratory distress   Sulfa Antibiotics     Per pt: unknown   Patient Active Problem List   Diagnosis Date Noted   Hx of adenomatous polyp of colon 12/19/2019   Hx of gastric ulcer 12/19/2019   Cervical cancer screening 03/20/2019   Vitamin D deficiency disease 01/19/2019  Lung nodule, solitary 05/28/2017   Mild intermittent asthma without complication 40/02/2724   Obesity (BMI 35.0-39.9 without comorbidity) 12/13/2014   Essential hypertension 07/13/2014   Intramural leiomyoma of uterus 06/19/2014   Visit for screening mammogram 11/16/2013   Allergic rhinosinusitis    Routine general medical examination at a health care facility 03/30/2011   GERD 01/26/2008   Social History   Socioeconomic History   Marital status: Divorced    Spouse name: Not on file   Number of children: 1   Years of education: Not on file   Highest education level: Not on file  Occupational History   Occupation: Film/video editor: Theme park manager  Tobacco Use   Smoking status: Never Smoker   Smokeless tobacco: Never Used   Tobacco comment: Regular Exercise - Yes  Vaping Use   Vaping Use: Never used  Substance and Sexual Activity   Alcohol use: No    Alcohol/week: 0.0 standard drinks   Drug use: No   Sexual activity: Yes    Birth control/protection: Post-menopausal    Comment: 1st intercourse- 16, partners - 3  Other Topics Concern   Not on file  Social History Narrative   Not on file   Social Determinants of Health   Financial Resource Strain:    Difficulty of Paying Living Expenses:   Food Insecurity:    Worried About Charity fundraiser in the Last Year:    Arboriculturist in the Last Year:   Transportation Needs:    Film/video editor (Medical):    Lack of Transportation (Non-Medical):   Physical Activity:    Days of Exercise per Week:    Minutes of Exercise per Session:   Stress:    Feeling of Stress :   Social Connections:    Frequency of Communication with Friends and Family:    Frequency of Social Gatherings with Friends and Family:    Attends Religious Services:    Active Member of Clubs or Organizations:    Attends Music therapist:    Marital Status:   Intimate Partner Violence:    Fear of Current or Ex-Partner:    Emotionally Abused:    Physically Abused:    Sexually Abused:     Ms. Betty Bell's family history includes Cancer in her brother; Cancer - Other in her brother; Colon cancer (age of onset: 96) in her brother; Colon cancer (age of onset: 79) in her father; Colon polyps in her sister; Diabetes in her father and mother; Hypertension in her father, mother, and sister; Irritable bowel syndrome in her sister; Kidney disease in an other family member; Lung cancer in her brother; Prostate cancer in her father.      Objective:    Vitals:   12/19/19 1452  BP: 124/72  Pulse: 89    Physical Exam Well-developed  well-nourished African-American female in no acute distress.  Height, Weight,223  BMI38.3  HEENT; nontraumatic normocephalic, EOMI, PE RR LA, sclera anicteric. Oropharynx; not examined Neck; supple, no JVD Cardiovascular; regular rate and rhythm with S1-S2, no murmur rub or gallop Pulmonary; Clear bilaterally Abdomen; soft, nondistended, no focal tenderness, no palpable mass or hepatosplenomegaly, bowel sounds are active Rectal; not done today Skin; benign exam, no jaundice rash or appreciable lesions Extremities; no clubbing cyanosis or edema skin warm and dry Neuro/Psych; alert and oriented x4, grossly nonfocal mood and affect appropriate       Assessment & Plan:   #15 59 year old female with  prior history of gastric ulcer and gastric erosions found on EGD October 2019 who presents now with 4 to 6-week history of upper abdominal discomfort worse at times postprandially with fairly constant mild discomfort.  She has continued Pepcid 40 mg p.o. twice daily. Etiology not clear rule out recurrent peptic ulcer disease, chronic gastropathy, doubt gallbladder disease but cannot rule out, rule out neoplasm.  #2.  Family history of colon cancer in patient's father and brother-patient last had colonoscopy in 2015 and is overdue for follow-up. #3.  History of adenomatous colon polyp #4.  History of GERD #5.  B12 deficiency #6.  Hypertension  Plan ; patient reluctant to try another PPI as did not tolerate well in the past.  Will continue Pepcid 40 mg p.o. twice daily and add Carafate liquid 1 g between meals and at bedtime. Patient will be scheduled for colonoscopy and EGD with Dr. Tarri Bell.  Both procedures discussed in detail with patient including indications risks and benefits and she is agreeable to proceed. If endoscopic evaluation is unrevealing and symptoms are persisting she will need further imaging with CT.  Natale Thoma Genia Harold PA-C 12/19/2019   Cc: Janith Lima, MD

## 2019-12-19 NOTE — Patient Instructions (Addendum)
If you are age 60 or older, your body mass index should be between 23-30. Your Body mass index is 38.32 kg/m. If this is out of the aforementioned range listed, please consider follow up with your Primary Care Provider.  If you are age 79 or younger, your body mass index should be between 19-25. Your Body mass index is 38.32 kg/m. If this is out of the aformentioned range listed, please consider follow up with your Primary Care Provider.   You have been scheduled for an endoscopy and colonoscopy. Please follow the written instructions given to you at your visit today. Please pick up your prep supplies at the pharmacy within the next 1-3 days. If you use inhalers (even only as needed), please bring them with you on the day of your procedure.  Due to recent changes in healthcare laws, you may see the results of your imaging and laboratory studies on MyChart before your provider has had a chance to review them.  We understand that in some cases there may be results that are confusing or concerning to you. Not all laboratory results come back in the same time frame and the provider may be waiting for multiple results in order to interpret others.  Please give [place] 48 hours in order for your provider to thoroughly review all the results before contacting the office for clarification of your results.   Continue using Pepcid 40 mg twice a day  START Carafate 1 gram between meals and before bed.  Follow up pending at tis time.

## 2019-12-26 ENCOUNTER — Telehealth: Payer: Self-pay | Admitting: Physician Assistant

## 2019-12-26 MED ORDER — SUCRALFATE 1 GM/10ML PO SUSP: 1.0000 g | Freq: Three times a day (TID) | ORAL | 1 refills | Status: DC

## 2019-12-26 MED ORDER — PLENVU 140 G PO SOLR: 1.0000 | ORAL | 0 refills | Status: DC

## 2019-12-26 NOTE — Telephone Encounter (Signed)
Called patient to verify which [facility], since two locations are on [address]. After further discussion. She decided to have me update her pharmacy as the [facility] on [address]. I advised I would send her medications right over.

## 2020-01-01 NOTE — Progress Notes (Signed)
Reviewed and agree with management plans. ? ?Jaren Kearn L. Laymon Stockert, MD, MPH  ?

## 2020-01-05 ENCOUNTER — Telehealth: Payer: Self-pay | Admitting: Gastroenterology

## 2020-01-05 NOTE — Telephone Encounter (Signed)
Patient was advised that her insurance required her to "Purchase the EGD benefit" for her policy before they would cover.  She called them and she does not have the funds to cover the payment that they require.  Her colonoscopy would be covered though without her having to pay for that benefit.  At this time, she wants to drop the EGD and keep the Colonoscopy as scheduled.  She does want a call back to confirm this is done today.

## 2020-01-05 NOTE — Telephone Encounter (Signed)
EGD cancelled per pt request. Dr. Tarri Glenn notified.

## 2020-01-05 NOTE — Telephone Encounter (Signed)
Noted! Thank you

## 2020-01-08 ENCOUNTER — Ambulatory Visit (AMBULATORY_SURGERY_CENTER): Payer: No Typology Code available for payment source | Admitting: Gastroenterology

## 2020-01-08 ENCOUNTER — Encounter: Payer: Self-pay | Admitting: Gastroenterology

## 2020-01-08 ENCOUNTER — Other Ambulatory Visit: Payer: Self-pay

## 2020-01-08 VITALS — BP 139/77 | HR 70 | Temp 98.2°F | Resp 18 | Ht 64.0 in | Wt 223.0 lb

## 2020-01-08 DIAGNOSIS — D122 Benign neoplasm of ascending colon: Secondary | ICD-10-CM | POA: Diagnosis not present

## 2020-01-08 DIAGNOSIS — Z86010 Personal history of colonic polyps: Secondary | ICD-10-CM | POA: Diagnosis not present

## 2020-01-08 DIAGNOSIS — D120 Benign neoplasm of cecum: Secondary | ICD-10-CM | POA: Diagnosis not present

## 2020-01-08 DIAGNOSIS — Z1211 Encounter for screening for malignant neoplasm of colon: Secondary | ICD-10-CM | POA: Diagnosis not present

## 2020-01-08 DIAGNOSIS — Z800 Family history of malignant neoplasm of digestive organs: Secondary | ICD-10-CM

## 2020-01-08 DIAGNOSIS — D124 Benign neoplasm of descending colon: Secondary | ICD-10-CM

## 2020-01-08 LAB — HM COLONOSCOPY

## 2020-01-08 MED ORDER — SODIUM CHLORIDE 0.9 % IV SOLN: 500.0000 mL | Freq: Once | INTRAVENOUS | Status: DC

## 2020-01-08 NOTE — Op Note (Signed)
[facility] Patient Name: Madge Therrien Procedure Date: 01/08/2020 7:19 AM MRN: [id number] Endoscopist: Thornton Park MD, MD Age: 59 Referring MD:  Date of Birth: [birth year] Gender: Female Account #: [id number] Procedure:                Colonoscopy Indications:              Surveillance: Personal history of adenomatous                            polyps on last colonoscopy > 5 years ago                           Personal history of colon polyps: tubular adenoma                            removed on colonoscopy 2015                           Family history of colon cancer: father in his 35s,                            brother in his 6s Medicines:                Monitored Anesthesia Care Procedure:                Pre-Anesthesia Assessment:                           - Prior to the procedure, a History and Physical                            was performed, and patient medications and                            allergies were reviewed. The patient's tolerance of                            previous anesthesia was also reviewed. The risks                            and benefits of the procedure and the sedation                            options and risks were discussed with the patient.                            All questions were answered, and informed consent                            was obtained. Prior Anticoagulants: The patient has                            taken no previous anticoagulant or antiplatelet  agents. ASA Grade Assessment: III - A patient with                            severe systemic disease. After reviewing the risks                            and benefits, the patient was deemed in                            satisfactory condition to undergo the procedure.                           After obtaining informed consent, the colonoscope                            was passed under direct vision. Throughout the                             procedure, the patient's blood pressure, pulse, and                            oxygen saturations were monitored continuously. The                            Colonoscope was introduced through the anus and                            advanced to the the cecum, identified by                            appendiceal orifice and ileocecal valve. A second                            forward view of the right colon was performed. The                            colonoscopy was performed with moderate difficulty                            due to a redundant colon, significant looping and a                            tortuous colon. Successful completion of the                            procedure was aided by applying abdominal pressure.                            The patient tolerated the procedure well. The                            quality of the bowel preparation was good. The  ileocecal valve, appendiceal orifice, and rectum                            were photographed. Scope In: 8:16:07 AM Scope Out: 8:34:14 AM Scope Withdrawal Time: 0 hours 14 minutes 41 seconds  Total Procedure Duration: 0 hours 18 minutes 7 seconds  Findings:                 The perianal and digital rectal examinations were                            normal.                           A 5 mm polyp was found in the descending colon. The                            polyp was flat. The polyp was removed with a cold                            snare. Resection and retrieval were complete.                            Estimated blood loss was minimal.                           Two sessile polyps were found in the ascending                            colon and cecum. The polyps were 1 to 2 mm in size.                            These polyps were removed with a cold snare.                            Resection and retrieval were complete. Estimated                            blood loss was minimal.                            The exam was otherwise without abnormality on                            direct and retroflexion views. Complications:            No immediate complications. Estimated blood loss:                            Minimal. Estimated Blood Loss:     Estimated blood loss was minimal. Impression:               - One 5 mm polyp in the descending colon, removed  with a cold snare. Resected and retrieved.                           - Two 1 to 2 mm polyps in the ascending colon and                            in the cecum, removed with a cold snare. Resected                            and retrieved.                           - The examination was otherwise normal on direct                            and retroflexion views. Recommendation:           - Patient has a contact number available for                            emergencies. The signs and symptoms of potential                            delayed complications were discussed with the                            patient. Return to normal activities tomorrow.                            Written discharge instructions were provided to the                            patient.                           - Resume previous diet.                           - Continue present medications.                           - Await pathology results.                           - Repeat colonoscopy date to be determined after                            pending pathology results are reviewed for                            surveillance.                           - Emerging evidence supports eating a diet of  fruits, vegetables, grains, calcium, and yogurt                            while reducing red meat and alcohol may reduce the                            risk of colon cancer.                           - Thank you for allowing me to be involved in your                            colon cancer  prevention. Thornton Park MD, MD 01/08/2020 8:43:28 AM This report has been signed electronically.

## 2020-01-08 NOTE — Progress Notes (Signed)
VS by AG  Pt's states no medical or surgical changes since previsit or office visit.  

## 2020-01-08 NOTE — Progress Notes (Signed)
Lidocaine 2% 41ml given with propofol as per Dr. Tarri Glenn.

## 2020-01-08 NOTE — Progress Notes (Signed)
Pt's states no medical or surgical changes since previsit or office visit. 

## 2020-01-08 NOTE — Progress Notes (Signed)
pt tolerated well. VSS. awake and to recovery. Report given to RN.  

## 2020-01-08 NOTE — Patient Instructions (Signed)
Handouts given:  Polyps Resume previous diet  continue present medications Await pathology results   YOU HAD AN ENDOSCOPIC PROCEDURE TODAY AT [facility]:   Refer to the procedure report that was given to you for any specific questions about what was found during the examination.  If the procedure report does not answer your questions, please call your gastroenterologist to clarify.  If you requested that your care partner not be given the details of your procedure findings, then the procedure report has been included in a sealed envelope for you to review at your convenience later.  YOU SHOULD EXPECT: Some feelings of bloating in the abdomen. Passage of more gas than usual.  Walking can help get rid of the air that was put into your GI tract during the procedure and reduce the bloating. If you had a lower endoscopy (such as a colonoscopy or flexible sigmoidoscopy) you may notice spotting of blood in your stool or on the toilet paper. If you underwent a bowel prep for your procedure, you may not have a normal bowel movement for a few days.  Please Note:  You might notice some irritation and congestion in your nose or some drainage.  This is from the oxygen used during your procedure.  There is no need for concern and it should clear up in a day or so.  SYMPTOMS TO REPORT IMMEDIATELY:   Following lower endoscopy (colonoscopy or flexible sigmoidoscopy):  Excessive amounts of blood in the stool  Significant tenderness or worsening of abdominal pains  Swelling of the abdomen that is new, acute  Fever of 100F or higher    For urgent or emergent issues, a gastroenterologist can be reached at any hour by calling [phone]. Do not use MyChart messaging for urgent concerns.    DIET:  We do recommend a small meal at first, but then you may proceed to your regular diet.  Drink plenty of fluids but you should avoid alcoholic beverages for 24 hours.  ACTIVITY:  You should plan  to take it easy for the rest of today and you should NOT DRIVE or use heavy machinery until tomorrow (because of the sedation medicines used during the test).    FOLLOW UP: Our staff will call the number listed on your records 48-72 hours following your procedure to check on you and address any questions or concerns that you may have regarding the information given to you following your procedure. If we do not reach you, we will leave a message.  We will attempt to reach you two times.  During this call, we will ask if you have developed any symptoms of COVID 19. If you develop any symptoms (ie: fever, flu-like symptoms, shortness of breath, cough etc.) before then, please call [phone].  If you test positive for Covid 19 in the 2 weeks post procedure, please call and report this information to [place].    If any biopsies were taken you will be contacted by phone or by letter within the next 1-3 weeks.  Please call us at [phone] if you have not heard about the biopsies in 3 weeks.    SIGNATURES/CONFIDENTIALITY: You and/or your care partner have signed paperwork which will be entered into your electronic medical record.  These signatures attest to the fact that that the information above on your After Visit Summary has been reviewed and is understood.  Full responsibility of the confidentiality of this discharge information lies with you and/or your care-partner.

## 2020-01-10 ENCOUNTER — Telehealth: Payer: Self-pay

## 2020-01-10 NOTE — Telephone Encounter (Signed)
°  Follow up Call-  Call back number 01/08/2020 02/28/2018  Post procedure Call Back phone  # [phone]  Permission to leave phone message Yes Yes  Some recent data might be hidden     Patient questions:  Do you have a fever, pain , or abdominal swelling? No. Pain Score  0 *  Have you tolerated food without any problems? Yes.    Have you been able to return to your normal activities? Yes.    Do you have any questions about your discharge instructions: Diet   No. Medications  No. Follow up visit  No.  Do you have questions or concerns about your Care? No.  Actions: * If pain score is 4 or above: 1. No action needed, pain <4.Have you developed a fever since your procedure? no  2.   Have you had an respiratory symptoms (SOB or cough) since your procedure? no  3.   Have you tested positive for COVID 19 since your procedure no  4.   Have you had any family members/close contacts diagnosed with the COVID 19 since your procedure?  no   If yes to any of these questions please route to Joylene John, RN and Joella Prince, RN

## 2020-01-11 ENCOUNTER — Encounter: Payer: Self-pay | Admitting: Gastroenterology

## 2020-01-11 ENCOUNTER — Other Ambulatory Visit: Payer: Self-pay | Admitting: Internal Medicine

## 2020-01-11 DIAGNOSIS — I10 Essential (primary) hypertension: Secondary | ICD-10-CM

## 2020-03-11 ENCOUNTER — Telehealth (INDEPENDENT_AMBULATORY_CARE_PROVIDER_SITE_OTHER): Payer: No Typology Code available for payment source | Admitting: Family

## 2020-03-11 DIAGNOSIS — J209 Acute bronchitis, unspecified: Secondary | ICD-10-CM

## 2020-03-11 MED ORDER — AZITHROMYCIN 250 MG PO TABS: ORAL_TABLET | ORAL | 0 refills | Status: DC

## 2020-03-11 MED ORDER — PREDNISONE 20 MG PO TABS: 20.0000 mg | ORAL_TABLET | Freq: Every day | ORAL | 0 refills | Status: DC

## 2020-03-11 MED ORDER — HYDROCODONE-HOMATROPINE 5-1.5 MG/5ML PO SYRP
5.0000 mL | ORAL_SOLUTION | Freq: Four times a day (QID) | ORAL | 0 refills | Status: DC | PRN
Start: 2020-03-11 — End: 2020-03-21

## 2020-03-11 NOTE — Progress Notes (Signed)
Betty Bell is a 59 y.o. female with the following history as recorded in EpicCare:  Patient Active Problem List   Diagnosis Date Noted  . Hx of adenomatous polyp of colon 12/19/2019  . Hx of gastric ulcer 12/19/2019  . Cervical cancer screening 03/20/2019  . Vitamin D deficiency disease 01/19/2019  . Lung nodule, solitary 05/28/2017  . Mild intermittent asthma without complication 32/35/5732  . Obesity (BMI 35.0-39.9 without comorbidity) 12/13/2014  . Essential hypertension 07/13/2014  . Intramural leiomyoma of uterus 06/19/2014  . Visit for screening mammogram 11/16/2013  . Allergic rhinosinusitis   . Routine general medical examination at a health care facility 03/30/2011  . GERD 01/26/2008    Current Outpatient Medications  Medication Sig Dispense Refill  . acetaminophen (TYLENOL) 500 MG tablet Take 500 mg by mouth every 6 (six) hours as needed.    Marland Kitchen azithromycin (ZITHROMAX) 250 MG tablet 2 tabs po qd x 1 day; 1 tablet per day x 4 days; 6 tablet 0  . cetirizine (ZYRTEC) 10 MG tablet Take 10 mg by mouth daily.    . Cholecalciferol 50 MCG (2000 UT) TABS Take 1 tablet (2,000 Units total) by mouth daily. 90 tablet 1  . famotidine (PEPCID) 20 MG tablet Take 2 tablets (40 mg total) by mouth 2 (two) times daily. 60 tablet 3  . HYDROcodone-homatropine (HYCODAN) 5-1.5 MG/5ML syrup Take 5 mLs by mouth every 6 (six) hours as needed for up to 10 days for cough. 120 mL 0  . indapamide (LOZOL) 1.25 MG tablet TAKE 1 TABLET (1.25 MG TOTAL) BY MOUTH DAILY. 30 tablet 5  . predniSONE (DELTASONE) 20 MG tablet Take 1 tablet (20 mg total) by mouth daily with breakfast. 5 tablet 0  . sucralfate (CARAFATE) 1 GM/10ML suspension Take 10 mLs (1 g total) by mouth 4 (four) times daily -  with meals and at bedtime. 1200 mL 1   No current facility-administered medications for this visit.    Allergies: Aspirin, Cephalosporins, Ciprofloxacin, Fish allergy, Nitrofurantoin monohyd macro, Penicillins,  Protonix [pantoprazole sodium], and Sulfa antibiotics  Past Medical History:  Diagnosis Date  . Anxiety   . Asthma   . Cervical dysplasia    High Grade  . CIN I (cervical intraepithelial neoplasia I)   . Colon polyp 2015   TUBULAR ADENOMA  . Endometrial polyp   . GERD (gastroesophageal reflux disease)   . Hydrosalpinx    Left and Right  . Hypertension   . IBS (irritable bowel syndrome)   . Rectal burning   . Seasonal allergies   . Uterine fibroid     Past Surgical History:  Procedure Laterality Date  . CARPAL TUNNEL RELEASE Left    x 2  . COLONOSCOPY  2015  . DILATION AND CURETTAGE OF UTERUS    . HYSTEROSCOPY    . LASER ABLATION OF THE CERVIX    . PELVIC LAPAROSCOPY     DL  . UTERINE FIBROID EMBOLIZATION    . wrist cystectomy Right     Family History  Problem Relation Age of Onset  . Diabetes Mother   . Hypertension Mother   . Diabetes Father   . Hypertension Father   . Prostate cancer Father   . Colon cancer Father 26  . Colon cancer Brother 65  . Cancer Brother        esophagus, skin  . Esophageal cancer Brother   . Colon polyps Sister   . Hypertension Sister   . Lung cancer Brother   .  Cancer - Other Brother        esophagal  . Kidney disease Other   . Irritable bowel syndrome Sister   . COPD Neg Hx   . Alcohol abuse Neg Hx   . Depression Neg Hx   . Drug abuse Neg Hx   . Early death Neg Hx   . Hearing loss Neg Hx   . Heart disease Neg Hx   . Hyperlipidemia Neg Hx   . Stroke Neg Hx   . Stomach cancer Neg Hx   . Rectal cancer Neg Hx     Social History   Tobacco Use  . Smoking status: Never Smoker  . Smokeless tobacco: Never Used  . Tobacco comment: Regular Exercise - Yes  Substance Use Topics  . Alcohol use: No    Alcohol/week: 0.0 standard drinks    Subjective:   I connected with Betty Bell on 03/11/20 at  2:20 PM EDT by a video enabled telemedicine application and verified that I am speaking with the correct person using two  identifiers.   I discussed the limitations of evaluation and management by telemedicine and the availability of in person appointments. The patient expressed understanding and agreed to proceed. Provider in office/ patient is at home; provider and patient are only 2 people on video call.   Concern for bronchitis; symptoms x 1 week; family members with similar symptoms- no COVID; is prone to bronchitis; would like same treatment with Zithromax and Prednisone that has been given in the past;   Also needs to get scheduled for OV with her PCP for yearly blood pressure check;    Objective:  There were no vitals filed for this visit.  General: Well developed, well nourished, in no acute distress  Head: Normocephalic and atraumatic  Lungs: Respirations unlabored;  Neurologic: Alert and oriented; speech intact; face symmetrical; moves all extremities well; CNII-XII intact without focal deficit   Assessment:  1. Acute bronchitis, unspecified organism     Plan:  Low suspicion for COVID as family members with similar symptoms have been negative; Rx for Zithromax, Prednisone and Hycodan; increase fluids, rest and plan for in office visit with her PCP next week;    No follow-ups on file.  No orders of the defined types were placed in this encounter.   Requested Prescriptions   Signed Prescriptions Disp Refills  . azithromycin (ZITHROMAX) 250 MG tablet 6 tablet 0    Sig: 2 tabs po qd x 1 day; 1 tablet per day x 4 days;  . predniSONE (DELTASONE) 20 MG tablet 5 tablet 0    Sig: Take 1 tablet (20 mg total) by mouth daily with breakfast.  . HYDROcodone-homatropine (HYCODAN) 5-1.5 MG/5ML syrup 120 mL 0    Sig: Take 5 mLs by mouth every 6 (six) hours as needed for up to 10 days for cough.

## 2020-03-21 ENCOUNTER — Ambulatory Visit (INDEPENDENT_AMBULATORY_CARE_PROVIDER_SITE_OTHER): Payer: No Typology Code available for payment source | Admitting: Internal Medicine

## 2020-03-21 ENCOUNTER — Other Ambulatory Visit: Payer: Self-pay

## 2020-03-21 ENCOUNTER — Encounter: Payer: Self-pay | Admitting: Internal Medicine

## 2020-03-21 VITALS — BP 156/82 | HR 88 | Temp 98.4°F | Resp 16 | Ht 64.0 in | Wt 225.0 lb

## 2020-03-21 DIAGNOSIS — Z23 Encounter for immunization: Secondary | ICD-10-CM | POA: Diagnosis not present

## 2020-03-21 DIAGNOSIS — Z0000 Encounter for general adult medical examination without abnormal findings: Secondary | ICD-10-CM

## 2020-03-21 DIAGNOSIS — I10 Essential (primary) hypertension: Secondary | ICD-10-CM

## 2020-03-21 DIAGNOSIS — R739 Hyperglycemia, unspecified: Secondary | ICD-10-CM | POA: Diagnosis not present

## 2020-03-21 NOTE — Patient Instructions (Signed)

## 2020-03-21 NOTE — Progress Notes (Signed)
Subjective:  Patient ID: Betty Bell, female    DOB: [birth year]  Age: 59 y.o. MRN: [id number]  CC: Annual Exam, Gastroesophageal Reflux, and Hypertension  This visit occurred during the SARS-CoV-2 public health emergency.  Safety protocols were in place, including screening questions prior to the visit, additional usage of staff PPE, and extensive cleaning of exam room while observing appropriate contact time as indicated for disinfecting solutions.    HPI Betty Bell presents for a CPX.  She does not monitor her blood pressure.  She admits that she has only been taking the diuretic every other day.  She is working on her lifestyle modifications and is active.  She denies any recent episodes of chest pain, shortness of breath, palpitations, edema, or fatigue.  Outpatient Medications Prior to Visit  Medication Sig Dispense Refill  . acetaminophen (TYLENOL) 500 MG tablet Take 500 mg by mouth every 6 (six) hours as needed.    . cetirizine (ZYRTEC) 10 MG tablet Take 10 mg by mouth daily.    . Cholecalciferol 50 MCG (2000 UT) TABS Take 1 tablet (2,000 Units total) by mouth daily. 90 tablet 1  . famotidine (PEPCID) 20 MG tablet Take 2 tablets (40 mg total) by mouth 2 (two) times daily. 60 tablet 3  . indapamide (LOZOL) 1.25 MG tablet TAKE 1 TABLET (1.25 MG TOTAL) BY MOUTH DAILY. 30 tablet 5  . azithromycin (ZITHROMAX) 250 MG tablet 2 tabs po qd x 1 day; 1 tablet per day x 4 days; 6 tablet 0  . HYDROcodone-homatropine (HYCODAN) 5-1.5 MG/5ML syrup Take 5 mLs by mouth every 6 (six) hours as needed for up to 10 days for cough. 120 mL 0  . predniSONE (DELTASONE) 20 MG tablet Take 1 tablet (20 mg total) by mouth daily with breakfast. 5 tablet 0  . sucralfate (CARAFATE) 1 GM/10ML suspension Take 10 mLs (1 g total) by mouth 4 (four) times daily -  with meals and at bedtime. 1200 mL 1   No facility-administered medications prior to visit.    ROS Review of Systems    Constitutional: Positive for unexpected weight change (wt gain). Negative for appetite change, diaphoresis and fatigue.  HENT: Negative.   Eyes: Negative for visual disturbance.  Respiratory: Negative for cough, chest tightness, shortness of breath and wheezing.   Cardiovascular: Negative for chest pain, palpitations and leg swelling.  Gastrointestinal: Negative for abdominal pain, constipation, diarrhea, nausea and vomiting.  Endocrine: Negative.   Genitourinary: Negative.  Negative for difficulty urinating.  Musculoskeletal: Positive for arthralgias. Negative for myalgias.  Skin: Negative.  Negative for color change.  Neurological: Negative.  Negative for dizziness, weakness, light-headedness, numbness and headaches.  Hematological: Negative for adenopathy. Does not bruise/bleed easily.  Psychiatric/Behavioral: Negative.     Objective:  BP (!) 156/82   Pulse 88   Temp 98.4 F (36.9 C) (Oral)   Resp 16   Ht 5\' 4"  (1.626 m)   Wt 225 lb (102.1 kg)   LMP 03/26/2011   SpO2 96%   BMI 38.62 kg/m   BP Readings from Last 3 Encounters:  03/21/20 (!) 156/82  01/08/20 139/77  12/19/19 124/72    Wt Readings from Last 3 Encounters:  03/21/20 225 lb (102.1 kg)  01/08/20 223 lb (101.2 kg)  12/19/19 223 lb 4 oz (101.3 kg)    Physical Exam Vitals reviewed.  HENT:     Nose: Nose normal.     Mouth/Throat:     Mouth: Mucous membranes are moist.  Eyes:     General: No scleral icterus.    Conjunctiva/sclera: Conjunctivae normal.  Cardiovascular:     Rate and Rhythm: Normal rate and regular rhythm.     Heart sounds: No murmur heard.   Pulmonary:     Effort: Pulmonary effort is normal.     Breath sounds: No stridor. No wheezing, rhonchi or rales.  Abdominal:     General: Abdomen is protuberant. Bowel sounds are normal. There is no distension.     Palpations: Abdomen is soft. There is no hepatomegaly, splenomegaly or mass.     Tenderness: There is no abdominal tenderness.      Hernia: No hernia is present.  Musculoskeletal:        General: Normal range of motion.     Cervical back: Neck supple.     Right lower leg: No edema.     Left lower leg: No edema.  Lymphadenopathy:     Cervical: No cervical adenopathy.  Skin:    General: Skin is warm and dry.     Coloration: Skin is not pale.  Neurological:     General: No focal deficit present.     Mental Status: She is alert.  Psychiatric:        Mood and Affect: Mood normal.        Behavior: Behavior normal.     Lab Results  Component Value Date   WBC 8.7 03/21/2020   HGB 13.5 03/21/2020   HCT 41.1 03/21/2020   PLT 323.0 03/21/2020   GLUCOSE 89 03/21/2020   CHOL 192 03/21/2020   TRIG 93.0 03/21/2020   HDL 55.20 03/21/2020   LDLCALC 118 (H) 03/21/2020   ALT 25 03/21/2020   AST 25 03/21/2020   NA 138 03/21/2020   K 4.5 03/21/2020   CL 101 03/21/2020   CREATININE 0.65 03/21/2020   BUN 11 03/21/2020   CO2 30 03/21/2020   TSH 0.60 01/18/2019   HGBA1C 5.9 03/21/2020    DG Lumbar Spine 2-3 Views  Result Date: 11/11/2019 CLINICAL DATA:  Low back pain with radiating symptoms into the left leg EXAM: LUMBAR SPINE - 2-3 VIEW COMPARISON:  None. FINDINGS: Five lumbar type vertebrae. No acute fracture vertebral body height loss. No spondylolisthesis or other malalignment. Mild multilevel intervertebral disc height loss with discogenic endplate changes and facet arthropathy maximal L3-S1. No worrisome osseous lesions. Mild arthrosis of the left hip noted incidentally. Otherwise included portions of bony pelvis are free of acute abnormality. Soft tissues are unremarkable. IMPRESSION: 1. Mild multilevel degenerative changes in the lumbar spine. No acute osseous abnormality. 2. Mild arthrosis of the left hip. Electronically Signed   By: Lovena Le M.D.   On: 11/11/2019 19:43    Assessment & Plan:   Giannah was seen today for annual exam, gastroesophageal reflux and hypertension.  Diagnoses and all orders for this  visit:  Essential hypertension- Her blood pressure is not adequately well controlled.  She agrees to take the diuretic every day and to improve her lifestyle modifications.  Her labs are negative for secondary causes or endorgan damage. -     CBC with Differential/Platelet; Future -     Basic metabolic panel; Future -     Basic metabolic panel -     CBC with Differential/Platelet  Routine general medical examination at a health care facility- Exam completed, labs reviewed - Her ASCVD risk score is less than 15% so I did not do recommend a statin for CV risk reduction, vaccines reviewed  and updated, cancer screenings are up-to-date, patient education was given. -     Lipid panel; Future -     Lipid panel  Hyperglycemia- Her A1c is mildly elevated.  Medical therapy is not indicated. -     Basic metabolic panel; Future -     Hepatic function panel; Future -     Hemoglobin A1c; Future -     Hemoglobin A1c -     Hepatic function panel -     Basic metabolic panel  Other orders -     Flu Vaccine QUAD 6+ mos PF IM (Fluarix Quad PF)   I have discontinued Kenedy M. Mcleroy "[name]"'s azithromycin, predniSONE, and HYDROcodone-homatropine. I am also having her maintain her cetirizine, famotidine, Cholecalciferol, acetaminophen, sucralfate, and indapamide.  No orders of the defined types were placed in this encounter.    Follow-up: Return in about 6 months (around 09/18/2020).  Scarlette Calico, MD

## 2020-03-22 LAB — LIPID PANEL
Cholesterol: 192 mg/dL (ref 0–200)
HDL: 55.2 mg/dL (ref >39.00)
LDL Cholesterol: 118 mg/dL — ABNORMAL HIGH (ref 0–99)
NonHDL: 136.48
Total CHOL/HDL Ratio: 3
Triglycerides: 93 mg/dL (ref 0.0–149.0)
VLDL: 18.6 mg/dL (ref 0.0–40.0)

## 2020-03-22 LAB — CBC WITH DIFFERENTIAL/PLATELET
Basophils Absolute: 0.1 10*3/uL (ref 0.0–0.1)
Basophils Relative: 1.2 % (ref 0.0–3.0)
Eosinophils Absolute: 0.1 10*3/uL (ref 0.0–0.7)
Eosinophils Relative: 1.6 % (ref 0.0–5.0)
HCT: 41.1 % (ref 36.0–46.0)
Hemoglobin: 13.5 g/dL (ref 12.0–15.0)
Lymphocytes Relative: 34.7 % (ref 12.0–46.0)
Lymphs Abs: 3 10*3/uL (ref 0.7–4.0)
MCHC: 32.9 g/dL (ref 30.0–36.0)
MCV: 83.7 fl (ref 78.0–100.0)
Monocytes Absolute: 0.9 10*3/uL (ref 0.1–1.0)
Monocytes Relative: 10.1 % (ref 3.0–12.0)
Neutro Abs: 4.6 10*3/uL (ref 1.4–7.7)
Neutrophils Relative %: 52.4 % (ref 43.0–77.0)
Platelets: 323 10*3/uL (ref 150.0–400.0)
RBC: 4.91 Mil/uL (ref 3.87–5.11)
RDW: 15.1 % (ref 11.5–15.5)
WBC: 8.7 10*3/uL (ref 4.0–10.5)

## 2020-03-22 LAB — BASIC METABOLIC PANEL
BUN: 11 mg/dL (ref 6–23)
CO2: 30 mEq/L (ref 19–32)
Calcium: 9.6 mg/dL (ref 8.4–10.5)
Chloride: 101 mEq/L (ref 96–112)
Creatinine, Ser: 0.65 mg/dL (ref 0.40–1.20)
GFR: 95.99 mL/min (ref >60.00)
Glucose, Bld: 89 mg/dL (ref 70–99)
Potassium: 4.5 mEq/L (ref 3.5–5.1)
Sodium: 138 mEq/L (ref 135–145)

## 2020-03-22 LAB — HEPATIC FUNCTION PANEL
ALT: 25 U/L (ref 0–35)
AST: 25 U/L (ref 0–37)
Albumin: 4.2 g/dL (ref 3.5–5.2)
Alkaline Phosphatase: 75 U/L (ref 39–117)
Bilirubin, Direct: 0.1 mg/dL (ref 0.0–0.3)
Total Bilirubin: 0.4 mg/dL (ref 0.2–1.2)
Total Protein: 7.6 g/dL (ref 6.0–8.3)

## 2020-03-22 LAB — HEMOGLOBIN A1C: Hgb A1c MFr Bld: 5.9 % (ref 4.6–6.5)

## 2020-06-17 ENCOUNTER — Telehealth: Payer: Self-pay | Admitting: Internal Medicine

## 2020-06-17 ENCOUNTER — Other Ambulatory Visit: Payer: Self-pay

## 2020-06-17 ENCOUNTER — Encounter: Payer: Self-pay | Admitting: Internal Medicine

## 2020-06-17 ENCOUNTER — Telehealth (INDEPENDENT_AMBULATORY_CARE_PROVIDER_SITE_OTHER): Payer: No Typology Code available for payment source | Admitting: Internal Medicine

## 2020-06-17 DIAGNOSIS — J0110 Acute frontal sinusitis, unspecified: Secondary | ICD-10-CM | POA: Diagnosis not present

## 2020-06-17 MED ORDER — AZITHROMYCIN 250 MG PO TABS: ORAL_TABLET | ORAL | 0 refills | Status: DC

## 2020-06-17 NOTE — Telephone Encounter (Signed)
Spoke with patient. She is still having SX. VV appointment has been made with Dr.Crawford.

## 2020-06-17 NOTE — Assessment & Plan Note (Signed)
Rx azithromycin due to allergies which she has taken in the past.

## 2020-06-17 NOTE — Telephone Encounter (Signed)
[facility] Call/Report : ---Caller states, Dry cough X 3 days tested COVID - sinus is runny denies any other symptoms  Home Care given : HOME CARE: * You should be able to treat this at home. REASSURANCE AND EDUCATION - COMMON COLD SYMPTOMS : * It sounds like an uncomplicated cold that we can treat at home. * Colds are very common and may make you feel uncomfortable. CARE ADVICE given per Cough - Acute Non-Productive (Adult) guideline. CALL BACK IF: NASAL WASHES - STEP-BY-STEP INSTRUCTIONS: * STEP 1: Lean over a sink. * STEP 2: Gently squirt or spray warm salt water into one of your nostrils. * STEP 3: Some of the water may run into the back of your throat. Spit this out. If you swallow the salt water it will not hurt you. * STEP 4: Blow your nose to clean out the water and mucus. * STEP 5: Repeat steps 1 through 4 for the other nostril. You can do this a couple times a day if it seems to help you. * For fevers above 101 F (38.3 C) take either acetaminophen or ibuprofen. FEVER MEDICINES: * Nasal discharge lasts over 10 days * Fever over 104 F (40 C), or fever lasts over 3 days * You become worse

## 2020-06-17 NOTE — Telephone Encounter (Signed)
If she can provide a negative COVID test she can schedule in office. If not she would have to be a virtual visit with a [facility] provider that offers that.

## 2020-06-17 NOTE — Progress Notes (Signed)
Virtual Visit via Video Note  I connected with Betty Bell on 06/17/20 at  2:40 PM EST by a video enabled telemedicine application and verified that I am speaking with the correct person using two identifiers.  The patient and the provider were at separate locations throughout the entire encounter. Patient location: home, Provider location: work   I discussed the limitations of evaluation and management by telemedicine and the availability of in person appointments. The patient expressed understanding and agreed to proceed. The patient and the provider were the only parties present for the visit unless noted in HPI below.  History of Present Illness: The patient is a 60 y.o. female with visit for sinus pain and cough without production. Started Saturday with the cough and the sinus problems have been going on 1-2 weeks. Has been trying mucinex. Taking zyrtec daily. Denies SOB on exertion. Overall it is worsening. Denies fevers or chills. Took covid-19 test last Friday and this was negative. Has had 2 shots no booster yet.   Observations/Objective: Appearance: normal, minimal coughing during visit, breathing appears normal, casual grooming, abdomen does not appear distended, throat not well visualized, mental status is A and O times 3  Assessment and Plan: See problem oriented charting  Follow Up Instructions: rx azithromycin given allergies for likely sinus infection  I discussed the assessment and treatment plan with the patient. The patient was provided an opportunity to ask questions and all were answered. The patient agreed with the plan and demonstrated an understanding of the instructions.   The patient was advised to call back or seek an in-person evaluation if the symptoms worsen or if the condition fails to improve as anticipated.  Hoyt Koch, MD

## 2020-08-20 ENCOUNTER — Other Ambulatory Visit: Payer: Self-pay | Admitting: Internal Medicine

## 2020-08-20 DIAGNOSIS — I10 Essential (primary) hypertension: Secondary | ICD-10-CM

## 2020-08-26 ENCOUNTER — Other Ambulatory Visit: Payer: Self-pay | Admitting: Internal Medicine

## 2020-08-26 DIAGNOSIS — I10 Essential (primary) hypertension: Secondary | ICD-10-CM

## 2020-08-26 MED ORDER — INDAPAMIDE 1.25 MG PO TABS: 1.2500 mg | ORAL_TABLET | Freq: Every day | ORAL | 1 refills | Status: DC

## 2020-10-23 ENCOUNTER — Ambulatory Visit (INDEPENDENT_AMBULATORY_CARE_PROVIDER_SITE_OTHER): Payer: No Typology Code available for payment source | Admitting: Internal Medicine

## 2020-10-23 ENCOUNTER — Other Ambulatory Visit: Payer: Self-pay

## 2020-10-23 ENCOUNTER — Encounter: Payer: Self-pay | Admitting: Internal Medicine

## 2020-10-23 VITALS — BP 138/76 | HR 83 | Temp 97.9°F | Resp 16 | Ht 64.0 in | Wt 226.0 lb

## 2020-10-23 DIAGNOSIS — I10 Essential (primary) hypertension: Secondary | ICD-10-CM

## 2020-10-23 DIAGNOSIS — Z23 Encounter for immunization: Secondary | ICD-10-CM

## 2020-10-23 DIAGNOSIS — Z1159 Encounter for screening for other viral diseases: Secondary | ICD-10-CM | POA: Insufficient documentation

## 2020-10-23 LAB — BASIC METABOLIC PANEL
BUN: 11 mg/dL (ref 6–23)
CO2: 27 mEq/L (ref 19–32)
Calcium: 9.7 mg/dL (ref 8.4–10.5)
Chloride: 100 mEq/L (ref 96–112)
Creatinine, Ser: 0.6 mg/dL (ref 0.40–1.20)
GFR: 97.45 mL/min (ref >60.00)
Glucose, Bld: 96 mg/dL (ref 70–99)
Potassium: 3.6 mEq/L (ref 3.5–5.1)
Sodium: 138 mEq/L (ref 135–145)

## 2020-10-23 LAB — TSH: TSH: 1.11 u[IU]/mL (ref 0.35–4.50)

## 2020-10-23 NOTE — Progress Notes (Signed)
Subjective:  Patient ID: Betty Bell, female    DOB: 07/15/1960  Age: 60 y.o. MRN: [id number]  CC: Hypertension  This visit occurred during the SARS-CoV-2 public health emergency.  Safety protocols were in place, including screening questions prior to the visit, additional usage of staff PPE, and extensive cleaning of exam room while observing appropriate contact time as indicated for disinfecting solutions.    HPI Betty Bell presents for f/up -   Her BP has been well controlled. She is active and denies CP, DOE, palpitations, edema , fatigue.  Outpatient Medications Prior to Visit  Medication Sig Dispense Refill   acetaminophen (TYLENOL) 500 MG tablet Take 500 mg by mouth every 6 (six) hours as needed.     cetirizine (ZYRTEC) 10 MG tablet Take 10 mg by mouth daily.     Cholecalciferol 50 MCG (2000 UT) TABS Take 1 tablet (2,000 Units total) by mouth daily. 90 tablet 1   famotidine (PEPCID) 20 MG tablet Take 2 tablets (40 mg total) by mouth 2 (two) times daily. 60 tablet 3   indapamide (LOZOL) 1.25 MG tablet Take 1 tablet (1.25 mg total) by mouth daily. 90 tablet 1   sucralfate (CARAFATE) 1 GM/10ML suspension Take 10 mLs (1 g total) by mouth 4 (four) times daily -  with meals and at bedtime. 1200 mL 1   No facility-administered medications prior to visit.    ROS Review of Systems  Constitutional:  Negative for appetite change, diaphoresis, fatigue and unexpected weight change.  HENT: Negative.    Eyes:  Negative for visual disturbance.  Respiratory:  Negative for cough, chest tightness, shortness of breath and wheezing.   Cardiovascular:  Negative for chest pain, palpitations and leg swelling.  Gastrointestinal:  Negative for abdominal pain, diarrhea, nausea and vomiting.  Endocrine: Negative.   Genitourinary: Negative.  Negative for difficulty urinating.  Musculoskeletal: Negative.  Negative for arthralgias and myalgias.  Skin: Negative.   Neurological:  Negative.  Negative for dizziness, weakness, light-headedness and headaches.  Hematological:  Negative for adenopathy. Does not bruise/bleed easily.  Psychiatric/Behavioral: Negative.     Objective:  BP 138/76 (BP Location: Left Arm, Patient Position: Sitting, Cuff Size: Large)   Pulse 83   Temp 97.9 F (36.6 C) (Oral)   Resp 16   Ht 5\' 4"  (1.626 m)   Wt 226 lb (102.5 kg)   LMP 03/26/2011   SpO2 96%   BMI 38.79 kg/m   BP Readings from Last 3 Encounters:  10/23/20 138/76  03/21/20 (!) 156/82  01/08/20 139/77    Wt Readings from Last 3 Encounters:  10/23/20 226 lb (102.5 kg)  03/21/20 225 lb (102.1 kg)  01/08/20 223 lb (101.2 kg)    Physical Exam Vitals reviewed.  HENT:     Nose: Nose normal.     Mouth/Throat:     Mouth: Mucous membranes are moist.  Eyes:     Conjunctiva/sclera: Conjunctivae normal.  Cardiovascular:     Rate and Rhythm: Normal rate and regular rhythm.     Heart sounds: No murmur heard. Pulmonary:     Effort: Pulmonary effort is normal.     Breath sounds: No stridor. No wheezing, rhonchi or rales.  Abdominal:     General: Abdomen is flat.     Palpations: There is no mass.     Tenderness: There is no abdominal tenderness.  Musculoskeletal:        General: Normal range of motion.     Cervical back: Neck supple.  Right lower leg: No edema.     Left lower leg: No edema.  Lymphadenopathy:     Cervical: No cervical adenopathy.  Skin:    General: Skin is warm and dry.  Neurological:     General: No focal deficit present.  Psychiatric:        Mood and Affect: Mood normal.    Lab Results  Component Value Date   WBC 8.7 03/21/2020   HGB 13.5 03/21/2020   HCT 41.1 03/21/2020   PLT 323.0 03/21/2020   GLUCOSE 96 10/23/2020   CHOL 192 03/21/2020   TRIG 93.0 03/21/2020   HDL 55.20 03/21/2020   LDLCALC 118 (H) 03/21/2020   ALT 25 03/21/2020   AST 25 03/21/2020   NA 138 10/23/2020   K 3.6 10/23/2020   CL 100 10/23/2020   CREATININE 0.60  10/23/2020   BUN 11 10/23/2020   CO2 27 10/23/2020   TSH 1.11 10/23/2020   HGBA1C 5.9 03/21/2020    DG Lumbar Spine 2-3 Views  Result Date: 11/11/2019 CLINICAL DATA:  Low back pain with radiating symptoms into the left leg EXAM: LUMBAR SPINE - 2-3 VIEW COMPARISON:  None. FINDINGS: Five lumbar type vertebrae. No acute fracture vertebral body height loss. No spondylolisthesis or other malalignment. Mild multilevel intervertebral disc height loss with discogenic endplate changes and facet arthropathy maximal L3-S1. No worrisome osseous lesions. Mild arthrosis of the left hip noted incidentally. Otherwise included portions of bony pelvis are free of acute abnormality. Soft tissues are unremarkable. IMPRESSION: 1. Mild multilevel degenerative changes in the lumbar spine. No acute osseous abnormality. 2. Mild arthrosis of the left hip. Electronically Signed   By: Lovena Le M.D.   On: 11/11/2019 19:43    Assessment & Plan:   [name] was seen today for hypertension.  Diagnoses and all orders for this visit:  Need for hepatitis C screening test -     Hepatitis C antibody; Future -     Hepatitis C antibody  Essential hypertension- Her BP is well controlled. Lytes and renal fxn are normal. Will cont the current dose of indapamide. -     Basic metabolic panel; Future -     TSH; Future -     TSH -     Basic metabolic panel  Other orders -     Tdap vaccine greater than or equal to 7yo IM  I am having Betty Bell "Betty Bell" maintain her cetirizine, famotidine, Cholecalciferol, acetaminophen, sucralfate, and indapamide.  No orders of the defined types were placed in this encounter.    Follow-up: Return in about 6 months (around 04/24/2021).  Scarlette Calico, MD

## 2020-10-23 NOTE — Patient Instructions (Signed)

## 2020-10-24 ENCOUNTER — Encounter: Payer: Self-pay | Admitting: Internal Medicine

## 2020-10-24 LAB — HEPATITIS C ANTIBODY
Hepatitis C Ab: NONREACTIVE
SIGNAL TO CUT-OFF: 0.01 (ref <1.00)

## 2021-01-20 ENCOUNTER — Encounter: Payer: Self-pay | Admitting: Emergency Medicine

## 2021-01-20 ENCOUNTER — Telehealth (INDEPENDENT_AMBULATORY_CARE_PROVIDER_SITE_OTHER): Payer: No Typology Code available for payment source | Admitting: Emergency Medicine

## 2021-01-20 ENCOUNTER — Telehealth: Payer: Self-pay

## 2021-01-20 DIAGNOSIS — R0981 Nasal congestion: Secondary | ICD-10-CM | POA: Diagnosis not present

## 2021-01-20 DIAGNOSIS — J22 Unspecified acute lower respiratory infection: Secondary | ICD-10-CM | POA: Diagnosis not present

## 2021-01-20 DIAGNOSIS — R059 Cough, unspecified: Secondary | ICD-10-CM | POA: Diagnosis not present

## 2021-01-20 DIAGNOSIS — R519 Headache, unspecified: Secondary | ICD-10-CM | POA: Diagnosis not present

## 2021-01-20 MED ORDER — GUAIFENESIN-CODEINE 100-10 MG/5ML PO SYRP: 5.0000 mL | ORAL_SOLUTION | Freq: Three times a day (TID) | ORAL | 1 refills | Status: DC | PRN

## 2021-01-20 MED ORDER — AZITHROMYCIN 250 MG PO TABS: ORAL_TABLET | ORAL | 0 refills | Status: DC

## 2021-01-20 MED ORDER — BENZONATATE 200 MG PO CAPS: 200.0000 mg | ORAL_CAPSULE | Freq: Two times a day (BID) | ORAL | 0 refills | Status: DC | PRN

## 2021-01-20 NOTE — Telephone Encounter (Signed)
Please advise as the pt has stated she has had a headache since Thursday, sinus pressure and post nasal drip that is causing her to have a cough and congestion.  **Pt asked that she please be rx'd medication like Z-Pak and Prednisone to help with sxs.

## 2021-01-20 NOTE — Telephone Encounter (Signed)
noted 

## 2021-01-20 NOTE — Progress Notes (Signed)
Telemedicine Encounter- SOAP NOTE Established Patient MyChart video conference Patient: Home  Provider: Office   Patient present only  This video telephone encounter was conducted with the patient's (or proxy's) verbal consent via video audio telecommunications: yes/no: Yes Patient was instructed to have this encounter in a suitably private space; and to only have persons present to whom they give permission to participate. In addition, patient identity was confirmed by use of name plus two identifiers (DOB and address).  I discussed the limitations, risks, security and privacy concerns of performing an evaluation and management service by telephone and the availability of in person appointments. I also discussed with the patient that there may be a patient responsible charge related to this service. The patient expressed understanding and agreed to proceed.  I spent a total of TIME; 0 MIN TO 60 MIN: 20 minutes talking with the patient or their proxy.  Chief complaint: Flulike symptoms  Subjective   Betty Bell is a 60 y.o. female established patient. Telephone visit today complaining of flulike symptoms that started 4 days ago.  Complaining of sinus headache, persistent dry cough, sinus congestion.  Denies high fever or chills.  Denies difficulty breathing or chest pain.  Able to eat and drink.  Denies nausea or vomiting.  Denies abdominal pain or diarrhea.  Denies loss of taste or smell.  Denies skin rash or any other associated symptomatology.  HPI   Patient Active Problem List   Diagnosis Date Noted   Need for hepatitis C screening test 10/23/2020   Hyperglycemia 03/21/2020   Hx of adenomatous polyp of colon 12/19/2019   Cervical cancer screening 03/20/2019   Vitamin D deficiency disease 01/19/2019   Lung nodule, solitary 05/28/2017   Sinusitis 10/14/2016   Obesity (BMI 35.0-39.9 without comorbidity) 12/13/2014   Essential hypertension 07/13/2014   Intramural  leiomyoma of uterus 06/19/2014   Visit for screening mammogram 11/16/2013   Allergic rhinosinusitis    Routine general medical examination at a health care facility 03/30/2011   GERD 01/26/2008    Past Medical History:  Diagnosis Date   Anxiety    Asthma    Cervical dysplasia    High Grade   CIN I (cervical intraepithelial neoplasia I)    Colon polyp 2015   TUBULAR ADENOMA   Endometrial polyp    GERD (gastroesophageal reflux disease)    Hydrosalpinx    Left and Right   Hypertension    IBS (irritable bowel syndrome)    Rectal burning    Seasonal allergies    Uterine fibroid     Current Outpatient Medications  Medication Sig Dispense Refill   azithromycin (ZITHROMAX) 250 MG tablet Sig as indicated 6 tablet 0   benzonatate (TESSALON) 200 MG capsule Take 1 capsule (200 mg total) by mouth 2 (two) times daily as needed for cough. 20 capsule 0   guaiFENesin-codeine (ROBITUSSIN AC) 100-10 MG/5ML syrup Take 5 mLs by mouth 3 (three) times daily as needed for cough. 120 mL 1   acetaminophen (TYLENOL) 500 MG tablet Take 500 mg by mouth every 6 (six) hours as needed.     cetirizine (ZYRTEC) 10 MG tablet Take 10 mg by mouth daily.     Cholecalciferol 50 MCG (2000 UT) TABS Take 1 tablet (2,000 Units total) by mouth daily. 90 tablet 1   famotidine (PEPCID) 20 MG tablet Take 2 tablets (40 mg total) by mouth 2 (two) times daily. 60 tablet 3   indapamide (LOZOL) 1.25 MG tablet Take 1  tablet (1.25 mg total) by mouth daily. 90 tablet 1   No current facility-administered medications for this visit.    Allergies  Allergen Reactions   Aspirin     REACTION: Swelling   Cephalosporins     Stomach cramps   Ciprofloxacin     Stomach cramps   Fish Allergy     Shortness of breath   Nitrofurantoin Monohyd Macro     Stomach cramps   Penicillins     Per pt: unknown   Protonix [Pantoprazole Sodium]     Respiratory distress   Sulfa Antibiotics     Per pt: unknown    Social History    Socioeconomic History   Marital status: Divorced    Spouse name: Not on file   Number of children: 1   Years of education: Not on file   Highest education level: Not on file  Occupational History   Occupation: Environmental education officer: [employer]  Tobacco Use   Smoking status: Never   Smokeless tobacco: Never   Tobacco comments:    Regular Exercise - Yes  Vaping Use   Vaping Use: Never used  Substance and Sexual Activity   Alcohol use: No    Alcohol/week: 0.0 standard drinks   Drug use: No   Sexual activity: Yes    Birth control/protection: Post-menopausal    Comment: 1st intercourse- 16, partners - 3  Other Topics Concern   Not on file  Social History Narrative   Not on file   Social Determinants of Health   Financial Resource Strain: Not on file  Food Insecurity: Not on file  Transportation Needs: Not on file  Physical Activity: Not on file  Stress: Not on file  Social Connections: Not on file  Intimate Partner Violence: Not on file    Review of Systems  Constitutional: Negative.  Negative for chills and fever.  HENT:  Positive for congestion and sinus pain. Negative for sore throat.   Respiratory:  Positive for cough. Negative for sputum production and shortness of breath.   Cardiovascular:  Negative for chest pain and palpitations.  Gastrointestinal:  Negative for abdominal pain, diarrhea, nausea and vomiting.  Genitourinary: Negative.   Musculoskeletal:  Negative for back pain, myalgias and neck pain.  Skin: Negative.  Negative for rash.  Neurological:  Negative for dizziness and headaches.  All other systems reviewed and are negative.  Objective   Vitals as reported by the patient: There were no vitals filed for this visit.  Diagnoses and all orders for this visit:  Cough -     benzonatate (TESSALON) 200 MG capsule; Take 1 capsule (200 mg total) by mouth 2 (two) times daily as needed for cough. -     guaiFENesin-codeine (ROBITUSSIN AC)  100-10 MG/5ML syrup; Take 5 mLs by mouth 3 (three) times daily as needed for cough.  Lower respiratory infection -     azithromycin (ZITHROMAX) 250 MG tablet; Sig as indicated  Sinus headache  Sinus congestion  Clinically stable.  No red flag signs or symptoms.  Requesting azithromycin because "it has helped a lot in the past."  May have secondary bacterial infection.  Cough affecting quality of life.  Will use cough suppressant with Tessalon and Cheratussin. Advised to stay well-hydrated and rest.  COVID precautions given. Advised to contact the office if no better or worse during the next several days.    I discussed the assessment and treatment plan with the patient. The patient was provided an  opportunity to ask questions and all were answered. The patient agreed with the plan and demonstrated an understanding of the instructions.   The patient was advised to call back or seek an in-person evaluation if the symptoms worsen or if the condition fails to improve as anticipated.  I provided 20 minutes of non-face-to-face time during this encounter.  Horald Pollen, MD  Primary Care at [facility]

## 2021-01-20 NOTE — Telephone Encounter (Signed)
Pt has called back and asked to be scheduled to see a provider.. I was able to add pt to Dr. Mitchel Honour schedule for today at 4pm as a VV.

## 2021-01-30 ENCOUNTER — Encounter: Payer: Self-pay | Admitting: Nurse Practitioner

## 2021-01-30 ENCOUNTER — Other Ambulatory Visit: Payer: Self-pay

## 2021-01-30 ENCOUNTER — Ambulatory Visit (INDEPENDENT_AMBULATORY_CARE_PROVIDER_SITE_OTHER): Payer: No Typology Code available for payment source | Admitting: Nurse Practitioner

## 2021-01-30 VITALS — BP 132/80 | Ht 64.0 in | Wt 229.0 lb

## 2021-01-30 DIAGNOSIS — Z780 Asymptomatic menopausal state: Secondary | ICD-10-CM | POA: Diagnosis not present

## 2021-01-30 DIAGNOSIS — Z01419 Encounter for gynecological examination (general) (routine) without abnormal findings: Secondary | ICD-10-CM

## 2021-01-30 DIAGNOSIS — B369 Superficial mycosis, unspecified: Secondary | ICD-10-CM

## 2021-01-30 MED ORDER — NYSTATIN 100000 UNIT/GM EX POWD: 1.0000 "application " | Freq: Two times a day (BID) | CUTANEOUS | 1 refills | Status: DC

## 2021-01-30 NOTE — Progress Notes (Signed)
   Betty Bell 11/27/1960 [id number]   History:  60 y.o. G1P0010 presents for annual exam. Postmenopausal - no HRT, no bleeding. Laser ablation for cervical dysplasia many years ago.  History of uterine fibroids, embolization in the past per patient. Complains of redness and itching of groin.   Gynecologic History Patient's last menstrual period was 03/26/2011.   Contraception/Family planning: post menopausal status Sexually active: Yes  Health Maintenance Last Pap: 06/19/2019. Results were: Normal, 5-year repeat Last mammogram: 05/31/2019. Results were: Normal Last colonoscopy: 01/08/2020. Results were: tubular adenomas, 3-year recall Last Dexa: Not indicated  Past medical history, past surgical history, family history and social history were all reviewed and documented in the EPIC chart. Divorced. Works for [employer]. Father and brother with history of colon cancer.   ROS:  A ROS was performed and pertinent positives and negatives are included.  Exam:  Vitals:   01/30/21 1452  BP: 132/80  Weight: 229 lb (103.9 kg)  Height: 5\' 4"  (1.626 m)   Body mass index is 39.31 kg/m.  General appearance:  Normal Thyroid:  Symmetrical, normal in size, without palpable masses or nodularity. Respiratory  Auscultation:  Clear without wheezing or rhonchi Cardiovascular  Auscultation:  Regular rate, without rubs, murmurs or gallops  Edema/varicosities:  Not grossly evident Abdominal  Soft,nontender, without masses, guarding or rebound.  Liver/spleen:  No organomegaly noted  Hernia:  None appreciated  Skin  Inspection:  Redness in groin consistent with fungal infection Breasts: Examined lying and sitting.   Right: Without masses, retractions, nipple discharge or axillary adenopathy.   Left: Without masses, retractions, nipple discharge or axillary adenopathy. Genitourinary   Inguinal/mons:  Normal without inguinal adenopathy  External genitalia:  Normal appearing vulva with no  masses, tenderness, or lesions  BUS/Urethra/Skene's glands:  Normal  Vagina:  Normal appearing with normal color and discharge, no lesions  Cervix:  Normal appearing without discharge or lesions  Uterus:  Difficult to palpate due to body habitus but no gross masses or tenderness  Adnexa/parametria:     Rt: Normal in size, without masses or tenderness.   Lt: Normal in size, without masses or tenderness.  Anus and perineum: Normal  Digital rectal exam: Normal sphincter tone without palpated masses or tenderness  Patient informed chaperone available to be present for breast and pelvic exam. Patient has requested no chaperone to be present. Patient has been advised what will be completed during breast and pelvic exam.   Assessment/Plan:  60 y.o. G1P0010 for annual exam.   Well female exam with routine gynecological exam - Education provided on SBEs, importance of preventative screenings, current guidelines, high calcium diet, regular exercise, and multivitamin daily.  Labs with PCP.   Postmenopausal - no HRT, no bleeding.   Fungal infection of skin - Plan: nystatin (MYCOSTATIN/NYSTOP) powder twice daily as needed. Keep area clean and dry.   Screening for cervical cancer - Laser ablation for cervical dysplasia many years ago.  Will repeat at 5-year interval per guidelines.  Screening for breast cancer - Normal mammogram history.  Continue annual screenings.  Normal breast exam today.  Screening for colon cancer - 2021 colonoscopy. Will repeat at GI's recommended interval.   Screening for osteoporosis - Average risk. Will plan for Dexa at age 26.   Return in 1 year for annual.   Tamela Gammon DNP, 3:11 PM 01/30/2021

## 2021-02-14 ENCOUNTER — Telehealth: Payer: Self-pay | Admitting: Gastroenterology

## 2021-02-14 NOTE — Telephone Encounter (Signed)
Pt has been taking OTC pepcid and is requesting a prescription for it.  Please advise.

## 2021-02-24 ENCOUNTER — Telehealth: Payer: Self-pay | Admitting: Internal Medicine

## 2021-02-24 NOTE — Telephone Encounter (Signed)
[facility] FYI 02/23/2021  Caller states has cough, runny nose, mild congestion. Symptoms began 2 weeks ago. Denies being exposed to [place].   Advised home care. Patient understood and agreed. Suggested Zyrtec for Allergies.

## 2021-02-24 NOTE — Telephone Encounter (Signed)
Noted  

## 2021-02-25 NOTE — Progress Notes (Signed)
Virtual Visit via Video Note  I connected with Betty Bell on 02/25/21 at 10:20 AM EDT by a video enabled telemedicine application and verified that I am speaking with the correct person using two identifiers.   I discussed the limitations of evaluation and management by telemedicine and the availability of in person appointments. The patient expressed understanding and agreed to proceed.  Present for the visit:  Myself, Dr Billey Gosling, [name].  The patient is currently at home and I am in the office.    No referring provider.    History of Present Illness: She is here for an acute visit for cold symptoms.   Her symptoms started Thursday last week - one week ago  She is experiencing fatigue, mild sinus pain cough that was initially mostly dry and becoming more wet, tightness in her chest, but denies any shortness of breath or wheezing.  She denies any fevers.  She does have a history of asthma  She has tried taking tessalon perles, cough syrup with codeine, allergy medication, nose spray-these medications do not seem to be helping.   Review of Systems  Constitutional:  Positive for malaise/fatigue. Negative for chills and fever.  HENT:  Positive for sinus pain (mild). Negative for congestion, ear pain and sore throat.   Respiratory:  Positive for cough (mostly dry, but is becoming more productive and wet). Negative for shortness of breath and wheezing.        Having some tightness in the chest  Gastrointestinal:  Negative for abdominal pain, diarrhea and nausea.  Musculoskeletal:  Negative for myalgias.  Neurological:  Positive for headaches. Negative for dizziness.  Psychiatric/Behavioral:  The patient has insomnia.      Social History   Socioeconomic History   Marital status: Divorced    Spouse name: Not on file   Number of children: 1   Years of education: Not on file   Highest education level: Not on file  Occupational History   Occupation: Development worker, community: [employer]  Tobacco Use   Smoking status: Never   Smokeless tobacco: Never   Tobacco comments:    Regular Exercise - Yes  Vaping Use   Vaping Use: Never used  Substance and Sexual Activity   Alcohol use: No    Alcohol/week: 0.0 standard drinks   Drug use: No   Sexual activity: Yes    Birth control/protection: Post-menopausal    Comment: 1st intercourse- 16, partners - 3  Other Topics Concern   Not on file  Social History Narrative   Not on file   Social Determinants of Health   Financial Resource Strain: Not on file  Food Insecurity: Not on file  Transportation Needs: Not on file  Physical Activity: Not on file  Stress: Not on file  Social Connections: Not on file     Observations/Objective: Appears well in NAD Frequent coughing during the visit Breathing normally and able to speak in full sentences Skin appears warm and dry  Assessment and Plan:  URI with acute asthma exacerbation: Acute Symptoms consistent with asthma exacerbation secondary to URI-concern for bacterial cause Is not improving with conservative measures Start Z-Pak, prednisone 40 mg daily x5 days She will continue using the Tessalon Perles and guaifenesin with codeine for sleep Can continue over-the-counter cold medications and allergy medications Call if no improvement   Follow Up Instructions:    I discussed the assessment and treatment plan with the patient. The patient was provided an opportunity  to ask questions and all were answered. The patient agreed with the plan and demonstrated an understanding of the instructions.   The patient was advised to call back or seek an in-person evaluation if the symptoms worsen or if the condition fails to improve as anticipated.    Binnie Rail, MD

## 2021-02-25 NOTE — Telephone Encounter (Signed)
Pt has been made a VV for tomorrow with Dr. Quay Burow 10/19 @ 1020am

## 2021-02-25 NOTE — Telephone Encounter (Signed)
Patient says she took COVID test & it was negative.. says she is still feeling bad & the medication is only helping w/ the cough nothing else  Please call patient [phone]

## 2021-02-26 ENCOUNTER — Encounter: Payer: Self-pay | Admitting: Internal Medicine

## 2021-02-26 ENCOUNTER — Other Ambulatory Visit: Payer: Self-pay | Admitting: Internal Medicine

## 2021-02-26 ENCOUNTER — Telehealth (INDEPENDENT_AMBULATORY_CARE_PROVIDER_SITE_OTHER): Payer: No Typology Code available for payment source | Admitting: Internal Medicine

## 2021-02-26 DIAGNOSIS — J4521 Mild intermittent asthma with (acute) exacerbation: Secondary | ICD-10-CM | POA: Diagnosis not present

## 2021-02-26 DIAGNOSIS — I10 Essential (primary) hypertension: Secondary | ICD-10-CM

## 2021-02-26 DIAGNOSIS — J069 Acute upper respiratory infection, unspecified: Secondary | ICD-10-CM

## 2021-02-26 MED ORDER — PREDNISONE 20 MG PO TABS: 40.0000 mg | ORAL_TABLET | Freq: Every day | ORAL | 0 refills | Status: DC

## 2021-02-26 MED ORDER — AZITHROMYCIN 250 MG PO TABS: ORAL_TABLET | ORAL | 0 refills | Status: DC

## 2021-02-28 ENCOUNTER — Telehealth: Payer: Self-pay | Admitting: Internal Medicine

## 2021-02-28 NOTE — Telephone Encounter (Signed)
Patient calling to state the tdap and flu vaccines are not showing on mychart  Patient states she had both vaccinations this year but does not remember the exact dates  I advised patient I did not see the vaccinations on her chart  Patient requesting a call back with vaccination dates

## 2021-03-03 NOTE — Telephone Encounter (Signed)
Noted.   Will wait for pt to call back with dates and I will update chart.

## 2021-03-12 ENCOUNTER — Ambulatory Visit (INDEPENDENT_AMBULATORY_CARE_PROVIDER_SITE_OTHER): Payer: No Typology Code available for payment source

## 2021-03-12 ENCOUNTER — Other Ambulatory Visit: Payer: Self-pay

## 2021-03-12 DIAGNOSIS — Z23 Encounter for immunization: Secondary | ICD-10-CM

## 2021-03-12 NOTE — Progress Notes (Signed)
Pt was given reg flu vacc w/o any complications.

## 2021-03-13 ENCOUNTER — Encounter: Payer: Self-pay | Admitting: Gastroenterology

## 2021-03-13 ENCOUNTER — Ambulatory Visit (INDEPENDENT_AMBULATORY_CARE_PROVIDER_SITE_OTHER): Payer: No Typology Code available for payment source | Admitting: Gastroenterology

## 2021-03-13 VITALS — BP 150/68 | HR 95 | Ht 65.0 in | Wt 231.0 lb

## 2021-03-13 DIAGNOSIS — R1010 Upper abdominal pain, unspecified: Secondary | ICD-10-CM | POA: Diagnosis not present

## 2021-03-13 MED ORDER — FAMOTIDINE 20 MG PO TABS: 20.0000 mg | ORAL_TABLET | Freq: Two times a day (BID) | ORAL | 3 refills | Status: DC

## 2021-03-13 NOTE — Progress Notes (Signed)
Referring Provider: Janith Lima, MD Primary Care Physician:  Janith Lima, MD   Chief complaint:  Abdominal pain   IMPRESSION:  Upper abdominal pain developing after recent use of prednisone with exacerbation of symptoms. She has a history of H pylori negative gastric ulcer and gastric erosions on EGD 2019. Etiology of pain includes recurrent PUD, gastritis, esophagitis, and side effective of medications. She remains resistant to PPI treatment.   PLAN: - Famotidine 20 mg BID (3 months with 3 refills) - Add Carafate 1 g BID x 2 weeks - Avoid NSAIDs - EGD with biopsies  HPI: Betty Bell is a 60 y.o. female who presents for evaluation of abdominal pain. She had an EGD 02/28/18 showed a 81mm antral ulcer and multiple gastric erosions.  Biopsies were negative for H pylori. She was last seen for a high quality colonoscopy 01/08/20 when 3 tubular adenomas were removed. Surveillance colonoscopy recommended in 3 years.  She is seen today for abdominal pain. Took Z-pak and prednisone 3 weeks ago for URI with acute asthma exacerbation. Developed upper abdominal pain one day after starting treatment. Mild nausea. No change in bowel habits. No blood or mucous in the stool.  She has not tolerated Nexium and multiple other PPIs due palpitations. She is weary about re-trying any of them. She would like a refill in her Pepcid.  Past Medical History:  Diagnosis Date   Anxiety    Asthma    Cervical dysplasia    High Grade   CIN I (cervical intraepithelial neoplasia I)    Colon polyp 2015   TUBULAR ADENOMA   Endometrial polyp    GERD (gastroesophageal reflux disease)    Hydrosalpinx    Left and Right   Hypertension    IBS (irritable bowel syndrome)    Rectal burning    Seasonal allergies    Uterine fibroid     Past Surgical History:  Procedure Laterality Date   CARPAL TUNNEL RELEASE Left    x 2   COLONOSCOPY  2015   DILATION AND CURETTAGE OF UTERUS     HYSTEROSCOPY      LASER ABLATION OF THE CERVIX     PELVIC LAPAROSCOPY     DL   UTERINE FIBROID EMBOLIZATION     wrist cystectomy Right     Prior to Admission medications   Medication Sig Start Date End Date Taking? Authorizing Provider  acetaminophen (TYLENOL) 500 MG tablet Take 500 mg by mouth every 6 (six) hours as needed.   Yes [provider]  cetirizine (ZYRTEC) 10 MG tablet Take 10 mg by mouth daily.   Yes [provider]  famotidine (PEPCID) 20 MG tablet Take 2 tablets (40 mg total) by mouth 2 (two) times daily. 03/02/18  Yes Thornton Park, MD  indapamide (LOZOL) 1.25 MG tablet TAKE 1 TABLET(1.25 MG) BY MOUTH DAILY 02/26/21  Yes Janith Lima, MD  nystatin (MYCOSTATIN/NYSTOP) powder Apply 1 application topically 2 (two) times daily. 01/30/21  Yes Tamela Gammon, NP    Current Outpatient Medications  Medication Sig Dispense Refill   acetaminophen (TYLENOL) 500 MG tablet Take 500 mg by mouth every 6 (six) hours as needed.     cetirizine (ZYRTEC) 10 MG tablet Take 10 mg by mouth daily.     famotidine (PEPCID) 20 MG tablet Take 2 tablets (40 mg total) by mouth 2 (two) times daily. 60 tablet 3   famotidine (PEPCID) 20 MG tablet Take 1 tablet (20 mg total) by  mouth 2 (two) times daily. 60 tablet 3   indapamide (LOZOL) 1.25 MG tablet TAKE 1 TABLET(1.25 MG) BY MOUTH DAILY 90 tablet 0   nystatin (MYCOSTATIN/NYSTOP) powder Apply 1 application topically 2 (two) times daily. 15 g 1   No current facility-administered medications for this visit.    Allergies as of 03/13/2021 - Review Complete 03/13/2021  Allergen Reaction Noted   Aspirin     Cephalosporins  06/08/2011   Ciprofloxacin  06/08/2011   Fish allergy  08/04/2013   Nitrofurantoin monohyd macro  06/08/2011   Penicillins  06/08/2011   Protonix [pantoprazole sodium]  02/28/2014   Sulfa antibiotics  06/08/2011    Family History  Problem Relation Age of Onset   Diabetes Mother    Hypertension Mother    Diabetes  Father    Hypertension Father    Prostate cancer Father    Colon cancer Father 59   Colon cancer Brother 27   Cancer Brother        esophagus, skin   Esophageal cancer Brother    Colon polyps Sister    Hypertension Sister    Lung cancer Brother    Cancer - Other Brother        esophagal   Kidney disease Other    Irritable bowel syndrome Sister    COPD Neg Hx    Alcohol abuse Neg Hx    Depression Neg Hx    Drug abuse Neg Hx    Early death Neg Hx    Hearing loss Neg Hx    Heart disease Neg Hx    Hyperlipidemia Neg Hx    Stroke Neg Hx    Stomach cancer Neg Hx    Rectal cancer Neg Hx     Social History   Socioeconomic History   Marital status: Divorced    Spouse name: Not on file   Number of children: 1   Years of education: Not on file   Highest education level: Not on file  Occupational History   Occupation: Environmental education officer: [employer]  Tobacco Use   Smoking status: Never   Smokeless tobacco: Never   Tobacco comments:    Regular Exercise - Yes  Vaping Use   Vaping Use: Never used  Substance and Sexual Activity   Alcohol use: No    Alcohol/week: 0.0 standard drinks   Drug use: No   Sexual activity: Yes    Birth control/protection: Post-menopausal    Comment: 1st intercourse- 16, partners - 3  Other Topics Concern   Not on file  Social History Narrative   Not on file   Social Determinants of Health   Financial Resource Strain: Not on file  Food Insecurity: Not on file  Transportation Needs: Not on file  Physical Activity: Not on file  Stress: Not on file  Social Connections: Not on file  Intimate Partner Violence: Not on file    Review of Systems: 12 system ROS is negative except as noted above.   Physical Exam: @VSRANGES @   General:   Alert,  well-nourished, pleasant and cooperative in NAD Head:  Normocephalic and atraumatic. Eyes:  Sclera clear, no icterus.   Conjunctiva pink. Ears:  Normal auditory acuity. Nose:  No  deformity, discharge,  or lesions. Mouth:  No deformity or lesions.   Neck:  Supple; no masses or thyromegaly. Lungs:  Clear throughout to auscultation.   No wheezes. Heart:  Regular rate and rhythm; no murmurs. Abdomen:  Soft,nontender, nondistended, normal bowel  sounds, no rebound or guarding. No hepatosplenomegaly.   Rectal:  Deferred  Msk:  Symmetrical. No boney deformities LAD: No inguinal or umbilical LAD Extremities:  No clubbing or edema. Neurologic:  Alert and  oriented x4;  grossly nonfocal Skin:  Intact without significant lesions or rashes. Psych:  Alert and cooperative. Normal mood and affect.  Harkirat Orozco L. Tarri Glenn, MD, MPH 03/16/2021, 6:54 PM

## 2021-03-13 NOTE — Patient Instructions (Signed)
If you are age 61 or older, your body mass index should be between 23-30. Your Body mass index is 38.44 kg/m. If this is out of the aforementioned range listed, please consider follow up with your Primary Care Provider.  If you are age 71 or younger, your body mass index should be between 19-25. Your Body mass index is 38.44 kg/m. If this is out of the aformentioned range listed, please consider follow up with your Primary Care Provider.   ________________________________________________________  The [facility] providers would like to encourage you to use [facility] to communicate with providers for non-urgent requests or questions.  Due to long hold times on the telephone, sending your provider a message by [facility] may be a faster and more efficient way to get a response.  Please allow 48 business hours for a response.  Please remember that this is for non-urgent requests.  _______________________________________________________   We have sent the following medications to your pharmacy for you to pick up at your convenience:  Famotodine  Please call the office to schedule your EGD - I will send your instructions through [facility]

## 2021-03-16 ENCOUNTER — Encounter: Payer: Self-pay | Admitting: Gastroenterology

## 2021-03-20 ENCOUNTER — Telehealth: Payer: Self-pay | Admitting: Internal Medicine

## 2021-03-20 NOTE — Telephone Encounter (Signed)
Inbound call from patient stating famotidine medication needs a prior authorization please.

## 2021-03-20 NOTE — Telephone Encounter (Signed)
Inbound call from pt again requesting a call back  to speak to a nurse or cma regarding the same medication famotidine. Please advise. Thank you.

## 2021-03-20 NOTE — Telephone Encounter (Signed)
Submitted PA for Pepcid.  Sent patient mychart message letting her know I would keep her posted.

## 2021-03-21 MED ORDER — FAMOTIDINE 20 MG PO TABS: 20.0000 mg | ORAL_TABLET | Freq: Two times a day (BID) | ORAL | 3 refills | Status: DC

## 2021-03-21 NOTE — Telephone Encounter (Signed)
PA for Pepcid approved - resent the rx with this information attached.  Sent patient a Pharmacist, community message that this was done.

## 2021-04-25 ENCOUNTER — Telehealth: Payer: Self-pay | Admitting: Internal Medicine

## 2021-04-25 NOTE — Telephone Encounter (Signed)
Patient states she is having cold like symptoms w/ a headache  Patient requesting an ov for today, informed patient there are no ov for today  Offered patient a vv for 12-13, patient declined  Patient requesting a call back to discuss otc medication options

## 2021-04-25 NOTE — Telephone Encounter (Signed)
I have attempted to schedule pt. However office has no open appointments until the middle of next week. Pt stated she doesn't she could deal with current Sx until then and she stated she would go to her local urgent care for eval.

## 2021-04-28 ENCOUNTER — Other Ambulatory Visit: Payer: Self-pay

## 2021-04-28 ENCOUNTER — Ambulatory Visit (INDEPENDENT_AMBULATORY_CARE_PROVIDER_SITE_OTHER): Payer: No Typology Code available for payment source

## 2021-04-28 ENCOUNTER — Ambulatory Visit (INDEPENDENT_AMBULATORY_CARE_PROVIDER_SITE_OTHER): Payer: No Typology Code available for payment source | Admitting: Internal Medicine

## 2021-04-28 ENCOUNTER — Encounter: Payer: Self-pay | Admitting: Internal Medicine

## 2021-04-28 VITALS — BP 132/78 | HR 96 | Temp 98.4°F | Resp 16 | Ht 65.0 in | Wt 225.0 lb

## 2021-04-28 DIAGNOSIS — I10 Essential (primary) hypertension: Secondary | ICD-10-CM

## 2021-04-28 DIAGNOSIS — U071 COVID-19: Secondary | ICD-10-CM | POA: Insufficient documentation

## 2021-04-28 DIAGNOSIS — J189 Pneumonia, unspecified organism: Secondary | ICD-10-CM | POA: Insufficient documentation

## 2021-04-28 DIAGNOSIS — R051 Acute cough: Secondary | ICD-10-CM | POA: Diagnosis not present

## 2021-04-28 DIAGNOSIS — J1282 Pneumonia due to coronavirus disease 2019: Secondary | ICD-10-CM | POA: Insufficient documentation

## 2021-04-28 LAB — SARS-COV-2 IGG: SARS-COV-2 IgG: 10.5

## 2021-04-28 MED ORDER — NUZYRA 150 MG PO TABS: 2.0000 | ORAL_TABLET | Freq: Every day | ORAL | 1 refills | Status: AC

## 2021-04-28 NOTE — Patient Instructions (Signed)

## 2021-04-28 NOTE — Progress Notes (Signed)
Subjective:  Patient ID: Maecyn Panning, female    DOB: 1960/06/10  Age: 60 y.o. MRN: [id number]  CC: No chief complaint on file.  This visit occurred during the SARS-CoV-2 public health emergency.  Safety protocols were in place, including screening questions prior to the visit, additional usage of staff PPE, and extensive cleaning of exam room while observing appropriate contact time as indicated for disinfecting solutions.    HPI Nga Rabon presents for f/up -  She complains of a 5-day history of nonproductive cough and headache.  She denies fever, chills, night sweats, shortness of breath, chest pain, or hemoptysis.  Outpatient Medications Prior to Visit  Medication Sig Dispense Refill   acetaminophen (TYLENOL) 500 MG tablet Take 500 mg by mouth every 6 (six) hours as needed.     cetirizine (ZYRTEC) 10 MG tablet Take 10 mg by mouth daily.     famotidine (PEPCID) 20 MG tablet Take 2 tablets (40 mg total) by mouth 2 (two) times daily. 60 tablet 3   famotidine (PEPCID) 20 MG tablet Take 1 tablet (20 mg total) by mouth 2 (two) times daily. 60 tablet 3   indapamide (LOZOL) 1.25 MG tablet TAKE 1 TABLET(1.25 MG) BY MOUTH DAILY 90 tablet 0   nystatin (MYCOSTATIN/NYSTOP) powder Apply 1 application topically 2 (two) times daily. 15 g 1   No facility-administered medications prior to visit.    ROS Review of Systems  Constitutional:  Negative for chills, diaphoresis, fatigue and fever.  HENT: Negative.  Negative for sore throat.   Eyes: Negative.   Respiratory:  Positive for cough. Negative for chest tightness, shortness of breath and wheezing.   Cardiovascular:  Negative for chest pain, palpitations and leg swelling.  Gastrointestinal:  Negative for abdominal pain, diarrhea, nausea and vomiting.  Endocrine: Negative.   Genitourinary: Negative.  Negative for difficulty urinating.  Musculoskeletal:  Negative for arthralgias.  Skin: Negative.   Neurological:  Negative  for dizziness, weakness and light-headedness.  Hematological:  Negative for adenopathy. Does not bruise/bleed easily.  Psychiatric/Behavioral: Negative.     Objective:  BP 132/78 (BP Location: Left Arm, Patient Position: Sitting, Cuff Size: Large)    Pulse 96    Temp 98.4 F (36.9 C) (Oral)    Resp 16    Ht 5\' 5"  (1.651 m)    Wt 225 lb (102.1 kg)    LMP 03/26/2011    SpO2 96%    BMI 37.44 kg/m   BP Readings from Last 3 Encounters:  04/28/21 132/78  03/13/21 (!) 150/68  01/30/21 132/80    Wt Readings from Last 3 Encounters:  04/28/21 225 lb (102.1 kg)  03/13/21 231 lb (104.8 kg)  01/30/21 229 lb (103.9 kg)    Physical Exam Vitals reviewed.  Constitutional:      General: She is not in acute distress.    Appearance: She is not ill-appearing, toxic-appearing or diaphoretic.  HENT:     Nose: Nose normal.     Mouth/Throat:     Mouth: Mucous membranes are moist.  Eyes:     Conjunctiva/sclera: Conjunctivae normal.  Cardiovascular:     Rate and Rhythm: Normal rate and regular rhythm.     Heart sounds: No murmur heard. Pulmonary:     Effort: Pulmonary effort is normal.     Breath sounds: No stridor. Examination of the right-lower field reveals rales. Rales present. No decreased breath sounds, wheezing or rhonchi.  Abdominal:     General: Abdomen is flat.  Palpations: There is no mass.     Tenderness: There is no abdominal tenderness. There is no guarding.     Hernia: No hernia is present.  Musculoskeletal:        General: Normal range of motion.     Cervical back: Neck supple.     Right lower leg: No edema.     Left lower leg: No edema.  Lymphadenopathy:     Cervical: No cervical adenopathy.  Skin:    General: Skin is warm and dry.  Neurological:     General: No focal deficit present.    Lab Results  Component Value Date   WBC 8.7 03/21/2020   HGB 13.5 03/21/2020   HCT 41.1 03/21/2020   PLT 323.0 03/21/2020   GLUCOSE 96 10/23/2020   CHOL 192 03/21/2020   TRIG  93.0 03/21/2020   HDL 55.20 03/21/2020   LDLCALC 118 (H) 03/21/2020   ALT 25 03/21/2020   AST 25 03/21/2020   NA 138 10/23/2020   K 3.6 10/23/2020   CL 100 10/23/2020   CREATININE 0.60 10/23/2020   BUN 11 10/23/2020   CO2 27 10/23/2020   TSH 1.11 10/23/2020   HGBA1C 5.9 03/21/2020    DG Lumbar Spine 2-3 Views  Result Date: 11/11/2019 CLINICAL DATA:  Low back pain with radiating symptoms into the left leg EXAM: LUMBAR SPINE - 2-3 VIEW COMPARISON:  None. FINDINGS: Five lumbar type vertebrae. No acute fracture vertebral body height loss. No spondylolisthesis or other malalignment. Mild multilevel intervertebral disc height loss with discogenic endplate changes and facet arthropathy maximal L3-S1. No worrisome osseous lesions. Mild arthrosis of the left hip noted incidentally. Otherwise included portions of bony pelvis are free of acute abnormality. Soft tissues are unremarkable. IMPRESSION: 1. Mild multilevel degenerative changes in the lumbar spine. No acute osseous abnormality. 2. Mild arthrosis of the left hip. Electronically Signed   By: Lovena Le M.D.   On: 11/11/2019 19:43   DG Chest 2 View  Result Date: 04/28/2021 CLINICAL DATA:  Cough for 5 days EXAM: CHEST - 2 VIEW COMPARISON:  05/27/2017 FINDINGS: The heart size and mediastinal contours are within normal limits. Mildly increased interstitial markings in the right lung base. No pleural effusion or pneumothorax. The visualized skeletal structures are unremarkable. IMPRESSION: Mildly increased interstitial markings in the right lung base, which may reflect bronchiolitis or developing infection. Electronically Signed   By: Davina Poke D.O.   On: 04/28/2021 14:52      Assessment & Plan:   Diagnoses and all orders for this visit:  Acute cough- Her COVID antibody is positive and her chest x-ray is concerning for pneumonia. -     SARS-COV-2 IgG; Future -     DG Chest 2 View; Future -     SARS-COV-2 IgG  Essential hypertension-  Her blood pressure is adequately well controlled.  Pneumonia of right lower lobe due to infectious organism- She has multiple drug allergies.  Will treat with omadacycline. -     Omadacycline Tosylate (NUZYRA) 150 MG TABS; Take 2 tablets by mouth daily for 7 days. -     Omadacycline Tosylate (NUZYRA) 150 MG TABS; Take 2 tablets by mouth in the morning and at bedtime.  Pneumonia due to COVID-19 virus- She has presented too late to benefit from treatment with an antiviral.   I am having Ivan Anchors. Quentin Cornwall "[name]" start on [place]. I am also having her maintain her cetirizine, famotidine, acetaminophen, nystatin, indapamide, and famotidine.  Meds  ordered this encounter  Medications   Omadacycline Tosylate (NUZYRA) 150 MG TABS    Sig: Take 2 tablets by mouth daily for 7 days.    Dispense:  14 tablet    Refill:  1   Omadacycline Tosylate (NUZYRA) 150 MG TABS    Sig: Take 2 tablets by mouth in the morning and at bedtime.    Dispense:  6 tablet    Refill:  0     Follow-up: Return in about 3 weeks (around 05/19/2021).  Scarlette Calico, MD

## 2021-04-29 ENCOUNTER — Telehealth: Payer: Self-pay | Admitting: Internal Medicine

## 2021-04-29 ENCOUNTER — Encounter: Payer: Self-pay | Admitting: Internal Medicine

## 2021-04-29 MED ORDER — NUZYRA 150 MG PO TABS: 2.0000 | ORAL_TABLET | Freq: Two times a day (BID) | ORAL | 0 refills | Status: DC

## 2021-04-29 NOTE — Telephone Encounter (Signed)
Patient requesting a call back to discuss side effects of pneumonia antibiotics

## 2021-04-29 NOTE — Telephone Encounter (Signed)
Called pt, LVM stating that Nuzyra asample was ready for pick up

## 2021-04-29 NOTE — Telephone Encounter (Signed)
Patient was informed by provider to pick up pneumonia antibiotics   Front desk stated there are no samples for patient   Please call patient when samples are ready for pick up

## 2021-05-01 ENCOUNTER — Other Ambulatory Visit: Payer: Self-pay | Admitting: Internal Medicine

## 2021-05-01 ENCOUNTER — Telehealth: Payer: Self-pay

## 2021-05-01 MED ORDER — PROMETHAZINE HCL 12.5 MG PO TABS: 12.5000 mg | ORAL_TABLET | Freq: Four times a day (QID) | ORAL | 0 refills | Status: DC | PRN

## 2021-05-01 NOTE — Telephone Encounter (Signed)
Just to clarify should she continue to take both morning and Evening dose with the new Rx Phenergan or just one time a day with the Phenergan?

## 2021-05-01 NOTE — Telephone Encounter (Signed)
Pt calling requesting to just take the Nuzyra 150 mg once a day due to bloating and bad stomach cramps. Vomited yesterday after second dose was taken. Has taken morning dose today and was fine.   Please advise Pt.

## 2021-05-01 NOTE — Telephone Encounter (Signed)
Called pt back to inform her to continue meds as ordered per Dr. Ronnald Ramp also to advise her to pick up new Rx for Phenergan.  Pt understood and agreed to pick up Rx

## 2021-05-06 ENCOUNTER — Telehealth: Payer: Self-pay | Admitting: Internal Medicine

## 2021-05-06 NOTE — Telephone Encounter (Signed)
Called pt, LVM to verify what pharmacy the phenergan should be sent to. It was sent to [facility] on 12/22.

## 2021-05-06 NOTE — Telephone Encounter (Signed)
Connected to [facility] 12.23.2022.    Caller spoke with the doctor yesterday and was to have an rx for Phenergan called in but it is not at the pharmacy. It is to help with nausea from another med.

## 2021-05-26 ENCOUNTER — Other Ambulatory Visit: Payer: Self-pay | Admitting: Internal Medicine

## 2021-05-26 DIAGNOSIS — I10 Essential (primary) hypertension: Secondary | ICD-10-CM

## 2021-06-01 NOTE — Progress Notes (Signed)
Subjective:    Patient ID: Betty Bell, female    DOB: 23-Jul-1960, 61 y.o.   MRN: [id number]  This visit occurred during the SARS-CoV-2 public health emergency.  Safety protocols were in place, including screening questions prior to the visit, additional usage of staff PPE, and extensive cleaning of exam room while observing appropriate contact time as indicated for disinfecting solutions.    HPI The patient is here for an acute visit.  Saw Dr Ronnald Ramp 12/19 for cough.  Had cxr with possible PNA and her COVID IgG test was positive.  She was treated with omadacycline.  She was out of the window for an antiviral.    She still has a dry cough that is residual.  She took about 4 days of the antibiotic - she did not get the full dose from the specialty pharmacy.  She does not think she would have been able to complete the full course because she did have a lot of stomach upset and is still dealing with some of that.  She is starting to see improvement in the nausea, GERD and constipation.     Medications and allergies reviewed with patient and updated if appropriate.  Patient Active Problem List   Diagnosis Date Noted   Pneumonia of right lower lobe due to infectious organism 04/28/2021   Acute cough 04/28/2021   Pneumonia due to COVID-19 virus 04/28/2021   Hx of adenomatous polyp of colon 12/19/2019   Cervical cancer screening 03/20/2019   Vitamin D deficiency disease 01/19/2019   Lung nodule, solitary 05/28/2017   Sinusitis 10/14/2016   Obesity (BMI 35.0-39.9 without comorbidity) 12/13/2014   Essential hypertension 07/13/2014   Intramural leiomyoma of uterus 06/19/2014   Visit for screening mammogram 11/16/2013   Allergic rhinosinusitis    Routine general medical examination at a health care facility 03/30/2011   GERD 01/26/2008    Current Outpatient Medications on File Prior to Visit  Medication Sig Dispense Refill   acetaminophen (TYLENOL) 500 MG tablet Take 500  mg by mouth every 6 (six) hours as needed.     cetirizine (ZYRTEC) 10 MG tablet Take 10 mg by mouth daily.     famotidine (PEPCID) 20 MG tablet Take 2 tablets (40 mg total) by mouth 2 (two) times daily. 60 tablet 3   famotidine (PEPCID) 20 MG tablet Take 1 tablet (20 mg total) by mouth 2 (two) times daily. 60 tablet 3   indapamide (LOZOL) 1.25 MG tablet TAKE 1 TABLET(1.25 MG) BY MOUTH DAILY 90 tablet 0   nystatin (MYCOSTATIN/NYSTOP) powder Apply 1 application topically 2 (two) times daily. 15 g 1   Omadacycline Tosylate (NUZYRA) 150 MG TABS Take 2 tablets by mouth in the morning and at bedtime. 6 tablet 0   promethazine (PHENERGAN) 12.5 MG tablet Take 1 tablet (12.5 mg total) by mouth every 6 (six) hours as needed for nausea or vomiting. 30 tablet 0   No current facility-administered medications on file prior to visit.    Past Medical History:  Diagnosis Date   Anxiety    Asthma    Cervical dysplasia    High Grade   CIN I (cervical intraepithelial neoplasia I)    Colon polyp 2015   TUBULAR ADENOMA   Endometrial polyp    GERD (gastroesophageal reflux disease)    Hydrosalpinx    Left and Right   Hypertension    IBS (irritable bowel syndrome)    Rectal burning    Seasonal allergies  Uterine fibroid     Past Surgical History:  Procedure Laterality Date   CARPAL TUNNEL RELEASE Left    x 2   COLONOSCOPY  2015   DILATION AND CURETTAGE OF UTERUS     HYSTEROSCOPY     LASER ABLATION OF THE CERVIX     PELVIC LAPAROSCOPY     DL   UTERINE FIBROID EMBOLIZATION     wrist cystectomy Right     Social History   Socioeconomic History   Marital status: Divorced    Spouse name: Not on file   Number of children: 1   Years of education: Not on file   Highest education level: Not on file  Occupational History   Occupation: Environmental education officer: [employer]  Tobacco Use   Smoking status: Never   Smokeless tobacco: Never   Tobacco comments:    Regular Exercise - Yes   Vaping Use   Vaping Use: Never used  Substance and Sexual Activity   Alcohol use: No    Alcohol/week: 0.0 standard drinks   Drug use: No   Sexual activity: Yes    Birth control/protection: Post-menopausal    Comment: 1st intercourse- 16, partners - 3  Other Topics Concern   Not on file  Social History Narrative   Not on file   Social Determinants of Health   Financial Resource Strain: Not on file  Food Insecurity: Not on file  Transportation Needs: Not on file  Physical Activity: Not on file  Stress: Not on file  Social Connections: Not on file    Family History  Problem Relation Age of Onset   Diabetes Mother    Hypertension Mother    Diabetes Father    Hypertension Father    Prostate cancer Father    Colon cancer Father 72   Colon cancer Brother 59   Cancer Brother        esophagus, skin   Esophageal cancer Brother    Colon polyps Sister    Hypertension Sister    Lung cancer Brother    Cancer - Other Brother        esophagal   Kidney disease Other    Irritable bowel syndrome Sister    COPD Neg Hx    Alcohol abuse Neg Hx    Depression Neg Hx    Drug abuse Neg Hx    Early death Neg Hx    Hearing loss Neg Hx    Heart disease Neg Hx    Hyperlipidemia Neg Hx    Stroke Neg Hx    Stomach cancer Neg Hx    Rectal cancer Neg Hx     Review of Systems  Constitutional:  Negative for chills and fever.  HENT:  Positive for sinus pressure (mild). Negative for congestion and sore throat.   Respiratory:  Positive for cough (dry). Negative for shortness of breath and wheezing.   Gastrointestinal:  Positive for abdominal pain and constipation (improving). Negative for nausea.       Gerd  Neurological:  Positive for light-headedness (occ). Negative for dizziness and headaches.      Objective:   Vitals:   06/02/21 1551  BP: 140/76  Pulse: 92  Temp: 98.1 F (36.7 C)  SpO2: 98%   BP Readings from Last 3 Encounters:  06/02/21 140/76  04/28/21 132/78  03/13/21  (!) 150/68   Wt Readings from Last 3 Encounters:  06/02/21 228 lb (103.4 kg)  04/28/21 225 lb (102.1 kg)  03/13/21  231 lb (104.8 kg)   Body mass index is 37.94 kg/m.   Physical Exam    GENERAL APPEARANCE: Appears stated age, well appearing, NAD EYES: conjunctiva clear, no icterus LUNGS: Unlabored breathing, good air entry bilaterally, clear to auscultation without wheeze or crackles CARDIOVASCULAR: Normal S1,S2 , no edema SKIN: Warm, dry   DG Chest 2 View CLINICAL DATA:  Cough for 5 days  EXAM: CHEST - 2 VIEW  COMPARISON:  05/27/2017  FINDINGS: The heart size and mediastinal contours are within normal limits. Mildly increased interstitial markings in the right lung base. No pleural effusion or pneumothorax. The visualized skeletal structures are unremarkable.  IMPRESSION: Mildly increased interstitial markings in the right lung base, which may reflect bronchiolitis or developing infection.  Electronically Signed   By: Davina Poke D.O.   On: 04/28/2021 14:52     Assessment & Plan:    Pneumonia due to COVID, right lower lobe: Completed 4 days of the antibiotic prescribed, but had GI side effects-did not complete the full course because she did not receive it from the specialty pharmacy Has dry cough there is residual and slowly improving-expect this to continue to improve No other concerning symptoms We will repeat chest x-ray today to make sure pneumonia has completely resolved Symptomatic treatment for cough  GERD: Flare related to antibiotic Continue Pepcid 20 mg twice daily Call if GERD does not continue to improve

## 2021-06-02 ENCOUNTER — Ambulatory Visit (INDEPENDENT_AMBULATORY_CARE_PROVIDER_SITE_OTHER): Payer: No Typology Code available for payment source

## 2021-06-02 ENCOUNTER — Other Ambulatory Visit: Payer: Self-pay

## 2021-06-02 ENCOUNTER — Encounter: Payer: Self-pay | Admitting: Internal Medicine

## 2021-06-02 ENCOUNTER — Ambulatory Visit (INDEPENDENT_AMBULATORY_CARE_PROVIDER_SITE_OTHER): Payer: No Typology Code available for payment source | Admitting: Internal Medicine

## 2021-06-02 VITALS — BP 140/76 | HR 92 | Temp 98.1°F | Ht 65.0 in | Wt 228.0 lb

## 2021-06-02 DIAGNOSIS — J1282 Pneumonia due to coronavirus disease 2019: Secondary | ICD-10-CM

## 2021-06-02 DIAGNOSIS — J189 Pneumonia, unspecified organism: Secondary | ICD-10-CM

## 2021-06-02 DIAGNOSIS — U071 COVID-19: Secondary | ICD-10-CM | POA: Diagnosis not present

## 2021-06-02 NOTE — Patient Instructions (Addendum)
° °  Have a chest xray today    Medications changes include :   none

## 2021-06-13 ENCOUNTER — Other Ambulatory Visit: Payer: Self-pay

## 2021-06-13 ENCOUNTER — Encounter: Payer: Self-pay | Admitting: Radiology

## 2021-06-13 ENCOUNTER — Telehealth: Payer: Self-pay | Admitting: *Deleted

## 2021-06-13 ENCOUNTER — Ambulatory Visit: Payer: No Typology Code available for payment source | Admitting: Radiology

## 2021-06-13 VITALS — BP 146/80

## 2021-06-13 DIAGNOSIS — N898 Other specified noninflammatory disorders of vagina: Secondary | ICD-10-CM

## 2021-06-13 DIAGNOSIS — N760 Acute vaginitis: Secondary | ICD-10-CM | POA: Diagnosis not present

## 2021-06-13 LAB — WET PREP FOR TRICH, YEAST, CLUE

## 2021-06-13 MED ORDER — NYSTATIN-TRIAMCINOLONE 100000-0.1 UNIT/GM-% EX OINT: 1.0000 "application " | TOPICAL_OINTMENT | Freq: Two times a day (BID) | CUTANEOUS | 0 refills | Status: DC

## 2021-06-13 NOTE — Telephone Encounter (Signed)
Patient scheduled today at 12:00

## 2021-06-13 NOTE — Telephone Encounter (Signed)
Patient called c/o vaginal itching, asked to be seen. Message sent to appointments to schedule.

## 2021-06-13 NOTE — Progress Notes (Signed)
Vaginal discharge   Subjective: c/o vaginal discharge and vaginal/rectal itching. Sx began after taking anitbitoics for pneumonia last week    Symptoms: - Abnormal vaginal discharge: yes  - Fever: no - Abdominal/Pelvic pain: no - Vaginal bleeding: no - Pain during sex: no - Rash: yes  Objective:  -Vulva: without lesions or discharge, mild erythema lower labia -Vagina: discharge present, wet prep obtained -Cervix: no lesion or discharge, no CMT -Perineum: erythema -Uterus: Mobile, non tender -Adnexa: no masses or tenderness  Wet prep: negative  Assessment: vulvovaginitis  Nystatin/triamcinolone BID   Plan: Use topical cream BID. Avoid the use of soaps or perfumed products in the peri area. Avoid tub baths and sitting in sweaty or wet clothing for prolonged periods of time.

## 2021-06-21 ENCOUNTER — Ambulatory Visit (HOSPITAL_COMMUNITY)
Admission: EM | Admit: 2021-06-21 | Discharge: 2021-06-21 | Disposition: A | Discharge status: Home / Self Care | Payer: No Typology Code available for payment source | Attending: Student | Admitting: Student

## 2021-06-21 ENCOUNTER — Other Ambulatory Visit: Payer: Self-pay

## 2021-06-21 ENCOUNTER — Encounter (HOSPITAL_COMMUNITY): Payer: Self-pay

## 2021-06-21 DIAGNOSIS — L290 Pruritus ani: Secondary | ICD-10-CM

## 2021-06-21 MED ORDER — FLUCONAZOLE 150 MG PO TABS
150.0000 mg | ORAL_TABLET | Freq: Every day | ORAL | 0 refills | Status: DC
Start: 2021-06-21 — End: 2021-07-01

## 2021-06-21 NOTE — ED Triage Notes (Signed)
Pt presents with c/o rectal irritation.   Pt states it only happens when she has a bowel movement.

## 2021-06-21 NOTE — Discharge Instructions (Addendum)
-  For your yeast infection, start the Diflucan (fluconazole)- Take one pill today (day 1). If you're still having symptoms in 3 days, take the second pill.

## 2021-06-21 NOTE — ED Provider Notes (Signed)
[place]    CSN: [id number] Arrival date & time: 06/21/21  1512      History   Chief Complaint Chief Complaint  Patient presents with   Rectal Pain    Irritation    HPI Betty Bell is a 61 y.o. female presenting with rectal itching for about 1 week.  Medical history rectal burning, IBS.  States that she is having some rectal irritation ONLY with defecation for about 1 week.  She was seen by her GYN for this about 3 days ago, they sent triamcinolone-nystatin cream, which has not provided any relief.  Patient states that her GYN said they saw some fungus on the outside of her anus.  Patient states she has no symptoms at rest.  Denies pain.  Previously tried Preparation H for this without relief.  States she is passing regular bowel movements. Last antibiotics were 06/02/21 for pneumonia. Denies vaginal symptoms.  HPI  Past Medical History:  Diagnosis Date   Anxiety    Asthma    Cervical dysplasia    High Grade   CIN I (cervical intraepithelial neoplasia I)    Colon polyp 2015   TUBULAR ADENOMA   Endometrial polyp    GERD (gastroesophageal reflux disease)    Hydrosalpinx    Left and Right   Hypertension    IBS (irritable bowel syndrome)    Rectal burning    Seasonal allergies    Uterine fibroid     Patient Active Problem List   Diagnosis Date Noted   Acute cough 04/28/2021   Pneumonia due to COVID-19 virus 04/28/2021   Hx of adenomatous polyp of colon 12/19/2019   Cervical cancer screening 03/20/2019   Vitamin D deficiency disease 01/19/2019   Lung nodule, solitary 05/28/2017   Sinusitis 10/14/2016   Obesity (BMI 35.0-39.9 without comorbidity) 12/13/2014   Essential hypertension 07/13/2014   Intramural leiomyoma of uterus 06/19/2014   Visit for screening mammogram 11/16/2013   Allergic rhinosinusitis    Routine general medical examination at a health care facility 03/30/2011   GERD 01/26/2008    Past Surgical History:  Procedure  Laterality Date   CARPAL TUNNEL RELEASE Left    x 2   COLONOSCOPY  2015   DILATION AND CURETTAGE OF UTERUS     HYSTEROSCOPY     LASER ABLATION OF THE CERVIX     PELVIC LAPAROSCOPY     DL   UTERINE FIBROID EMBOLIZATION     wrist cystectomy Right     OB History     Gravida  1   Para      Term      Preterm      AB  1   Living  0      SAB  1   IAB      Ectopic      Multiple      Live Births               Home Medications    Prior to Admission medications   Medication Sig Start Date End Date Taking? Authorizing Provider  fluconazole (DIFLUCAN) 150 MG tablet Take 1 tablet (150 mg total) by mouth daily. -For your yeast infection, start the Diflucan (fluconazole)- Take one pill today (day 1). If you're still having symptoms in 3 days, take the second pill. 06/21/21  Yes Hazel Sams, PA-C  acetaminophen (TYLENOL) 500 MG tablet Take 500 mg by mouth every 6 (six) hours as needed.    [provider]  cetirizine (ZYRTEC) 10 MG tablet Take 10 mg by mouth daily.    [provider]  famotidine (PEPCID) 20 MG tablet Take 2 tablets (40 mg total) by mouth 2 (two) times daily. 03/02/18   Thornton Park, MD  indapamide (LOZOL) 1.25 MG tablet TAKE 1 TABLET(1.25 MG) BY MOUTH DAILY 05/26/21   Janith Lima, MD  nystatin-triamcinolone ointment [facility]) Apply 1 application topically 2 (two) times daily. 06/13/21   Chrzanowski, Annitta Needs, NP    Family History Family History  Problem Relation Age of Onset   Diabetes Mother    Hypertension Mother    Diabetes Father    Hypertension Father    Prostate cancer Father    Colon cancer Father 75   Colon cancer Brother 72   Cancer Brother        esophagus, skin   Esophageal cancer Brother    Colon polyps Sister    Hypertension Sister    Lung cancer Brother    Cancer - Other Brother        esophagal   Kidney disease Other    Irritable bowel syndrome Sister    COPD Neg Hx    Alcohol abuse Neg Hx     Depression Neg Hx    Drug abuse Neg Hx    Early death Neg Hx    Hearing loss Neg Hx    Heart disease Neg Hx    Hyperlipidemia Neg Hx    Stroke Neg Hx    Stomach cancer Neg Hx    Rectal cancer Neg Hx     Social History Social History   Tobacco Use   Smoking status: Never   Smokeless tobacco: Never   Tobacco comments:    Regular Exercise - Yes  Vaping Use   Vaping Use: Never used  Substance Use Topics   Alcohol use: No    Alcohol/week: 0.0 standard drinks   Drug use: No     Allergies   Aspirin, Cephalosporins, Ciprofloxacin, Fish allergy, Nitrofurantoin monohyd macro, Penicillins, Protonix [pantoprazole sodium], and Sulfa antibiotics   Review of Systems Review of Systems  Skin:        Anal itching     Physical Exam Triage Vital Signs ED Triage Vitals  Enc Vitals Group     BP 06/21/21 1551 (!) 156/73     Pulse Rate 06/21/21 1551 91     Resp 06/21/21 1551 17     Temp 06/21/21 1551 98.2 F (36.8 C)     Temp Source 06/21/21 1551 Oral     SpO2 06/21/21 1551 96 %     Weight --      Height --      Head Circumference --      Peak Flow --      Pain Score 06/21/21 1550 0     Pain Loc --      Pain Edu? --      Excl. in [place]? --    No data found.  Updated Vital Signs BP (!) 156/73 (BP Location: Right Arm)    Pulse 91    Temp 98.2 F (36.8 C) (Oral)    Resp 17    LMP 03/26/2011    SpO2 96%   Visual Acuity Right Eye Distance:   Left Eye Distance:   Bilateral Distance:    Right Eye Near:   Left Eye Near:    Bilateral Near:     Physical Exam Vitals reviewed.  Constitutional:  General: She is not in acute distress.    Appearance: Normal appearance. She is not ill-appearing.  HENT:     Head: Normocephalic and atraumatic.  Pulmonary:     Effort: Pulmonary effort is normal.  Genitourinary:    Comments: Rectal: copious nystatin cream visible surrounding anus. No external hemorrhoid or fissure. No rash or lesion visualized. Internal exam not performed.   Neurological:     General: No focal deficit present.     Mental Status: She is alert and oriented to person, place, and time.  Psychiatric:        Mood and Affect: Mood normal.        Behavior: Behavior normal.        Thought Content: Thought content normal.        Judgment: Judgment normal.     UC Treatments / Results  Labs (all labs ordered are listed, but only abnormal results are displayed) Labs Reviewed - No data to display  EKG   Radiology No results found.  Procedures Procedures (including critical care time)  Medications Ordered in UC Medications - No data to display  Initial Impression / Assessment and Plan / UC Course  I have reviewed the triage vital signs and the nursing notes.  Pertinent labs & imaging results that were available during my care of the patient were reviewed by me and considered in my medical decision making (see chart for details).     This patient is a very pleasant 61 y.o. year old female presenting with rectal itching during defecation. Afebrile, nontachy. Has already attempted triamcinolone-nystatin as prescribed by gyn without relief. Has also attempted preparation H. She did take abx before onset of symptoms. Will try diflucan today. F/u with gyn if symptoms persist. ED return precautions discussed. Patient verbalizes understanding and agreement.    Final Clinical Impressions(s) / UC Diagnoses   Final diagnoses:  Anal itching     Discharge Instructions      -For your yeast infection, start the Diflucan (fluconazole)- Take one pill today (day 1). If you're still having symptoms in 3 days, take the second pill.     ED Prescriptions     Medication Sig Dispense Auth. Provider   fluconazole (DIFLUCAN) 150 MG tablet Take 1 tablet (150 mg total) by mouth daily. -For your yeast infection, start the Diflucan (fluconazole)- Take one pill today (day 1). If you're still having symptoms in 3 days, take the second pill. 2 tablet Hazel Sams, PA-C      PDMP not reviewed this encounter.   Hazel Sams, PA-C 06/21/21 1607

## 2021-06-23 ENCOUNTER — Other Ambulatory Visit: Payer: Self-pay | Admitting: Nurse Practitioner

## 2021-06-23 ENCOUNTER — Telehealth: Payer: Self-pay

## 2021-06-23 DIAGNOSIS — N762 Acute vulvitis: Secondary | ICD-10-CM

## 2021-06-23 DIAGNOSIS — L290 Pruritus ani: Secondary | ICD-10-CM

## 2021-06-23 MED ORDER — CLOBETASOL PROPIONATE 0.05 % EX OINT: 1.0000 "application " | TOPICAL_OINTMENT | Freq: Two times a day (BID) | CUTANEOUS | 0 refills | Status: DC

## 2021-06-23 NOTE — Telephone Encounter (Signed)
I would recommend finishing the 2 Diflucan tablets to see if her symptoms resolve. It can take up to a week for yeast to eradicate and symptoms to begin to improve although her wet prep was negative. We could also try a topical steroid of higher strength to use twice daily for 7-10 days to help calm the tissue down. She is also welcome to return if she prefers another look.

## 2021-06-23 NOTE — Telephone Encounter (Signed)
Patient saw Jami,NP on 06/13/21 for vag/rectal itching. She prescribed Triamcinolone cream. Patient said it really is not helping.  On Saturday the rectal itching and burning was so bad she went to [facility] and was prescribed 2 Diflucan tabs.  Patient said it seems to settle down until she has a bowel movement and that sets it off and she said she is uncomfortable for the rest of the day.  She asked if she should return and be re-examined or if something else could be prescribed.

## 2021-06-23 NOTE — Telephone Encounter (Signed)
Prescription sent. Thanks

## 2021-06-23 NOTE — Telephone Encounter (Signed)
Patient said she would like to try "a topical steroid of higher strength to use twice daily for 7-10 days to help calm the tissue down".  If it still persists after trying that she said she will come back in for re-exam.

## 2021-07-01 ENCOUNTER — Ambulatory Visit (INDEPENDENT_AMBULATORY_CARE_PROVIDER_SITE_OTHER): Payer: No Typology Code available for payment source | Admitting: Radiology

## 2021-07-01 ENCOUNTER — Encounter: Payer: Self-pay | Admitting: Radiology

## 2021-07-01 ENCOUNTER — Other Ambulatory Visit: Payer: Self-pay

## 2021-07-01 VITALS — BP 146/82

## 2021-07-01 DIAGNOSIS — K648 Other hemorrhoids: Secondary | ICD-10-CM

## 2021-07-01 MED ORDER — HYDROCORTISONE ACETATE 25 MG RE SUPP: 25.0000 mg | Freq: Two times a day (BID) | RECTAL | 1 refills | Status: DC

## 2021-07-01 NOTE — Progress Notes (Signed)
° °  Betty Bell 1960-07-15 [id number]   History: Postmenopausal 61 y.o. presents with c/o continued rectal itching. She was started on nystatin/triamcinolone with little effect. Was seen in urgent care and given diflucan x2 with no improvement. She then called the office and was given clobetasol, again no improvement. She reports she only feels the itching/discomfort after having a BM. Last colonoscopy 2022 showed polyps per pt.      Obstetric History OB History  Gravida Para Term Preterm AB Living  1       1 0  SAB IAB Ectopic Multiple Live Births  1            # Outcome Date GA Lbr Len/2nd Weight Sex Delivery Anes PTL Lv  1 SAB              The following portions of the patient's history were reviewed and updated as appropriate: allergies, current medications, past family history, past medical history, past social history, past surgical history, and problem list.  Review of Systems Pertinent items noted in HPI and remainder of comprehensive ROS otherwise negative.  Past medical history, past surgical history, family history and social history were all reviewed and documented in the EPIC chart.  Exam:  Vitals:   07/01/21 1454  BP: (!) 146/82   There is no height or weight on file to calculate BMI.  General appearance:  Normal  Abdominal  Soft,nontender, without masses, guarding or rebound.  Liver/spleen:  No organomegaly noted  Hernia:  None appreciated  Skin  Inspection:  Grossly normal  Genitourinary   Inguinal/mons:  Normal without inguinal adenopathy  External genitalia:  Normal appearing vulva with no masses, tenderness, or lesions  BUS/Urethra/Skene's glands:  Normal  Vagina:  Normal appearing with normal color and discharge, no lesions. Atrophy: moderate   Cervix:  Normal appearing without discharge or lesions  Uterus:  Normal in size, shape and contour.  Midline and mobile, nontender  Adnexa/parametria:     Rt: Normal in size, without masses or  tenderness.   Lt: Normal in size, without masses or tenderness.  Anus and perineum: Normal  Digital rectal exam: Normal sphincter tone 2cm inflamed internal hemorrhoid  Patient informed chaperone available to be present for  pelvic exam. Patient has requested no chaperone to be present.   Assessment/Plan:   Rectal itching caused by hemorrhoid Will; begin Anusol suppositories BID Continue sitz baths If no improvement after 2 weeks see GI   Delita Chiquito B WHNP-BC, 3:05 PM 07/01/2021

## 2021-07-04 ENCOUNTER — Telehealth: Payer: Self-pay | Admitting: Gastroenterology

## 2021-07-04 NOTE — Telephone Encounter (Signed)
Patient called said she is having issues with hemorrhoids seeking advise.

## 2021-07-07 NOTE — Telephone Encounter (Signed)
LVM requesting returned call. Appears pt is scheduled as follows:  Next Appt With Gastroenterology Vladimir Crofts, PA-C) 07/11/2021 at 10:00 AM

## 2021-07-08 NOTE — Telephone Encounter (Signed)
SECOND ATTEMPT: ° °LVM requesting returned call. °

## 2021-07-10 NOTE — Progress Notes (Signed)
? ? ?07/11/2021 ?[name] ?[id number] ?July 08, 1960 ? ? ?ASSESSMENT AND PLAN:  ? ?Anal fissure ?After medication induced constipation started with anal itching/burning with BM.  ?Exam with obvious small posterior fissures ?Will send in lidocaine/diltiazem ?We discussed anal fissures and long healing process ?Sitz baths, high-fiber diet, add on benefiber.  ?Long discussion about preventing constipation discussed in detail for prevention.  ?Follow up should symptoms persist for secondary evaluation and possible surgical referral for repair. ? ?History of adenomatous polyp of colon ?Due 12/2022, can repeat sooner if not improving ? ?History of Present Illness:  ?61 y.o. female  with a past medical history of GERD, asthma, HTN, IBS  and others listed below, known to Dr. Tarri Glenn returns to clinic today for evaluation of Hemorrhoids.  ?Last seen in the office 03/13/2021 for upper AB pain.  ?01/08/2020 colonoscopy due to personal history of polyps, FM history of colon cancer. 5 mm polyp, 2 polyps 1-2 mm in size showed tubular adenomas- recall 3 years (12/2022) ?EGD 2019 4 mm antral ulcer and H pylori negative gastritis ? ?She had pneumonia in Jan, given ABX, then she had constipation after that, symptoms started after that. Had burning, itching, BRB on TP with BM only  ?Went to GYN first, states they said she had external fungus treated with cream and had internal hemorrhoids, given suppository since last week.  ?Denies weight loss.  ? ?States GERD well controlled with pepcid but can still have break through pain, no melena, no weight loss. Epigastric discomfort with milk.  ?Still needs to schedule EGD with Dr. Tarri Glenn, checking info ? ?Current Medications:  ? ? ?Current Outpatient Medications (Cardiovascular):  ?  indapamide (LOZOL) 1.25 MG tablet, TAKE 1 TABLET(1.25 MG) BY MOUTH DAILY ? ?Current Outpatient Medications (Respiratory):  ?  cetirizine (ZYRTEC) 10 MG tablet, Take 10 mg by mouth daily. ? ?Current  Outpatient Medications (Analgesics):  ?  acetaminophen (TYLENOL) 500 MG tablet, Take 500 mg by mouth every 6 (six) hours as needed. ? ? ?Current Outpatient Medications (Other):  ?  famotidine (PEPCID) 20 MG tablet, Take 2 tablets (40 mg total) by mouth 2 (two) times daily. ?  hydrocortisone (ANUSOL-HC) 25 MG suppository, Place 1 suppository (25 mg total) rectally 2 (two) times daily. ?  clobetasol ointment (TEMOVATE) 0.17 %, Apply 1 application topically 2 (two) times daily. (Patient not taking: Reported on 07/11/2021) ? ?Surgical History:  ?She  has a past surgical history that includes Uterine fibroid embolization; Pelvic laparoscopy; Dilation and curettage of uterus; Hysteroscopy; Laser ablation of the cervix; Carpal tunnel release (Left); wrist cystectomy (Right); and Colonoscopy (2015). ?Family History:  ?Her family history includes Cancer in her brother; Cancer - Other in her brother; Colon cancer (age of onset: 39) in her brother; Colon cancer (age of onset: 72) in her father; Colon polyps in her sister; Diabetes in her father and mother; Esophageal cancer in her brother; Hypertension in her father, mother, and sister; Irritable bowel syndrome in her sister; Kidney disease in an other family member; Lung cancer in her brother; Prostate cancer in her father. ?Social History:  ? reports that she has never smoked. She has never used smokeless tobacco. She reports that she does not drink alcohol and does not use drugs. ? ?Current Medications, Allergies, Past Medical History, Past Surgical History, Family History and Social History were reviewed in [employer] record. ? ?Physical Exam: ?BP 130/74 (BP Location: Left Arm, Patient Position: Sitting, Cuff Size: Normal)   Pulse 84  Ht 5' 3.5" (1.613 m) Comment: height measured without shoes  Wt 226 lb 8 oz (102.7 kg)   LMP 03/26/2011   BMI 39.49 kg/m?  ?General:   Pleasant, well developed female in no acute distress ?Eyes: sclerae  anicteric,conjunctive pink  ?Heart:  regular rate and rhythm ?Pulm: Clear anteriorly; no wheezing ?Abdomen:  Soft, Obese AB, skin exam normal, Normal bowel sounds.  no  tenderness . Without guarding and Without rebound, without hepatomegaly. ?Extremities:  Without edema. Peripheral pulses intact.  ?Rectal: 2 small rectal fissures at anterior, normal rectal tone, no internal hemorrhoids appreciated, no masses, non tender, brown stool, hemoccult positive.  ?Neurologic:  Alert and  oriented x4;  grossly normal neurologically. ?Skin:   Dry and intact without significant lesions or rashes. ?Psychiatric: Demonstrates good judgement and reason without abnormal affect or behaviors. ? ?Vladimir Crofts, PA-C ?07/11/21 ?

## 2021-07-11 ENCOUNTER — Ambulatory Visit (INDEPENDENT_AMBULATORY_CARE_PROVIDER_SITE_OTHER): Payer: No Typology Code available for payment source | Admitting: Physician Assistant

## 2021-07-11 ENCOUNTER — Encounter: Payer: Self-pay | Admitting: Physician Assistant

## 2021-07-11 VITALS — BP 130/74 | HR 84 | Ht 63.5 in | Wt 226.5 lb

## 2021-07-11 DIAGNOSIS — Z86010 Personal history of colonic polyps: Secondary | ICD-10-CM | POA: Diagnosis not present

## 2021-07-11 DIAGNOSIS — K602 Anal fissure, unspecified: Secondary | ICD-10-CM

## 2021-07-11 MED ORDER — AMBULATORY NON FORMULARY MEDICATION: 1 refills | Status: DC

## 2021-07-11 NOTE — Progress Notes (Signed)
Reviewed and agree with management plans. ? ?Elwin Tsou L. Tabbitha Janvrin, MD, MPH  ?

## 2021-07-11 NOTE — Patient Instructions (Addendum)
[facility]:  The following medication has been prescribed:  Lidocaine/diltiazem This medication MUST be compounded by a compounding pharmacy. Your prescription has been sent to:  [facility] Address: [address], [place], [place] [address]  [phone]  Please DO NOT go directly from our office to pick up this medication! Give the pharmacy 1 day to process the prescription. Extra time is required for them to compound your medication.  Anal Fissure, Adult  Diltiazem/lidocaine 3 x daily for 2 months sent to compound pharmacy [facility]  Follow up should symptoms persist for secondary evaluation and possible surgical referral for repair.  Miralax is an osmotic laxative.  It only brings more water into the stool.  This is safe to take daily.  Can take up to 17 gram of miralax twice a day.  Mix with juice or coffee.  Start 1 capful at night for 3-4 days and reassess your response in 3-4 days.  You can increase and decrease the dose based on your response.  Remember, it can take up to 3-4 days to take effect OR for the effects to wear off.   I often pair this with benefiber in the morning to help assure the stool is not too loose.   An anal fissure is a small tear or crack in the skin around the anus. Bleeding from a fissure usually stops on its own within a few minutes. However, bleeding will often occur again with each bowel movement until the crack heals. CAUSES This condition may be caused by: Passing large, hard stool (feces). Frequent diarrhea. Constipation. Inflammatory bowel disease (Crohn disease or ulcerative colitis). Infections. Anal sex. SYMPTOMS Symptoms of this condition include: Bleeding from the rectum. Small amounts of blood seen on your stool, on toilet paper, or in the toilet after a bowel movement. Painful bowel movements. Itching or irritation around the anus. DIAGNOSIS  A health care provider may diagnose this condition by  closely examining the anal area. An anal fissure can usually be seen with careful inspection. In some cases, a rectal exam may be performed, or a short tube (anoscope) may be used to examine the anal canal. TREATMENT Treatment for this condition may include: Taking steps to avoid constipation. This may include making changes to your diet, such as increasing your intake of fiber or fluid. Taking fiber supplements. These supplements can soften your stool to help make bowel movements easier. Your health care provider may also prescribe a stool softener if your stool is often hard. Taking sitz baths. This may help to heal the tear. Using medicated creams or ointments. These may be prescribed to lessen discomfort. HOME CARE INSTRUCTIONS Eating and Drinking Avoid foods that may be constipating, such as bananas and dairy products. Drink enough fluid to keep your urine clear or pale yellow. Maintain a diet that is high in fruits, whole grains, and vegetables. General Instructions Keep the anal area as clean and dry as possible. Take sitz baths as told by your health care provider. Do not use soap in the sitz baths. Take over-the-counter and prescription medicines only as told by your health care provider. Use creams or ointments only as told by your health care provider. Keep all follow-up visits as told by your health care provider. This is important. SEEK MEDICAL CARE IF: You have more bleeding. You have a fever. You have diarrhea that is mixed with blood. You continue to have pain. Your problem is getting worse rather than better.   This  information is not intended to replace advice given to you by your health care provider. Make sure you discuss any questions you have with your health care provider.   Document Released: 04/27/2005 Document Revised: 01/16/2015 Document Reviewed: 07/23/2014 Elsevier Interactive Patient Education 2016 [employer].  Avoid spicy and acidic foods Avoid fatty  foods Limit your intake of coffee, tea, alcohol, and carbonated drinks Work to maintain a healthy weight Keep the head of the bed elevated at least 3 inches with blocks or a wedge pillow if you are having any nighttime symptoms Stay upright for 2 hours after eating Avoid meals and snacks three to four hours before bedtime  Gastroesophageal Reflux Disease, Adult Gastroesophageal reflux (GER) happens when acid from the stomach flows up into the tube that connects the mouth and the stomach (esophagus). Normally, food travels down the esophagus and stays in the stomach to be digested. However, when a person has GER, food and stomach acid sometimes move back up into the esophagus. If this becomes a more serious problem, the person may be diagnosed with a disease called gastroesophageal reflux disease (GERD). GERD occurs when the reflux: Happens often. Causes frequent or severe symptoms. Causes problems such as damage to the esophagus. When stomach acid comes in contact with the esophagus, the acid may cause inflammation in the esophagus. Over time, GERD may create small holes (ulcers) in the lining of the esophagus. What are the causes? This condition is caused by a problem with the muscle between the esophagus and the stomach (lower esophageal sphincter, or LES). Normally, the LES muscle closes after food passes through the esophagus to the stomach. When the LES is weakened or abnormal, it does not close properly, and that allows food and stomach acid to go back up into the esophagus. The LES can be weakened by certain dietary substances, medicines, and medical conditions, including: Tobacco use. Pregnancy. Having a hiatal hernia. Alcohol use. Certain foods and beverages, such as coffee, chocolate, onions, and peppermint. What increases the risk? You are more likely to develop this condition if you: Have an increased body weight. Have a connective tissue disorder. Take NSAIDs, such as  ibuprofen. What are the signs or symptoms? Symptoms of this condition include: Heartburn. Difficult or painful swallowing and the feeling of having a lump in the throat. A bitter taste in the mouth. Bad breath and having a large amount of saliva. Having an upset or bloated stomach and belching. Chest pain. Different conditions can cause chest pain. Make sure you see your health care provider if you experience chest pain. Shortness of breath or wheezing. Ongoing (chronic) cough or a nighttime cough. Wearing away of tooth enamel. Weight loss. How is this diagnosed? This condition may be diagnosed based on a medical history and a physical exam. To determine if you have mild or severe GERD, your health care provider may also monitor how you respond to treatment. You may also have tests, including: A test to examine your stomach and esophagus with a small camera (endoscopy). A test that measures the acidity level in your esophagus. A test that measures how much pressure is on your esophagus. A barium swallow or modified barium swallow test to show the shape, size, and functioning of your esophagus. How is this treated? Treatment for this condition may vary depending on how severe your symptoms are. Your health care provider may recommend: Changes to your diet. Medicine. Surgery. The goal of treatment is to help relieve your symptoms and to prevent complications.  Follow these instructions at home: Eating and drinking  Follow a diet as recommended by your health care provider. This may involve avoiding foods and drinks such as: Coffee and tea, with or without caffeine. Drinks that contain alcohol. Energy drinks and sports drinks. Carbonated drinks or sodas. Chocolate and cocoa. Peppermint and mint flavorings. Garlic and onions. Horseradish. Spicy and acidic foods, including peppers, chili powder, curry powder, vinegar, hot sauces, and barbecue sauce. Citrus fruit juices and citrus  fruits, such as oranges, lemons, and limes. Tomato-based foods, such as red sauce, chili, salsa, and pizza with red sauce. Fried and fatty foods, such as donuts, french fries, potato chips, and high-fat dressings. High-fat meats, such as hot dogs and fatty cuts of red and white meats, such as rib eye steak, sausage, ham, and bacon. High-fat dairy items, such as whole milk, butter, and cream cheese. Eat small, frequent meals instead of large meals. Avoid drinking large amounts of liquid with your meals. Avoid eating meals during the 2-3 hours before bedtime. Avoid lying down right after you eat. Do not exercise right after you eat. Lifestyle  Do not use any products that contain nicotine or tobacco. These products include cigarettes, chewing tobacco, and vaping devices, such as e-cigarettes. If you need help quitting, ask your health care provider. Try to reduce your stress by using methods such as yoga or meditation. If you need help reducing stress, ask your health care provider. If you are overweight, reduce your weight to an amount that is healthy for you. Ask your health care provider for guidance about a safe weight loss goal. General instructions Pay attention to any changes in your symptoms. Take over-the-counter and prescription medicines only as told by your health care provider. Do not take aspirin, ibuprofen, or other NSAIDs unless your health care provider told you to take these medicines. Wear loose-fitting clothing. Do not wear anything tight around your waist that causes pressure on your abdomen. Raise (elevate) the head of your bed about 6 inches (15 cm). You can use a wedge to do this. Avoid bending over if this makes your symptoms worse. Keep all follow-up visits. This is important. Contact a health care provider if: You have: New symptoms. Unexplained weight loss. Difficulty swallowing or it hurts to swallow. Wheezing or a persistent cough. A hoarse voice. Your  symptoms do not improve with treatment. Get help right away if: You have sudden pain in your arms, neck, jaw, teeth, or back. You suddenly feel sweaty, dizzy, or light-headed. You have chest pain or shortness of breath. You vomit and the vomit is green, yellow, or black, or it looks like blood or coffee grounds. You faint. You have stool that is red, bloody, or black. You cannot swallow, drink, or eat. These symptoms may represent a serious problem that is an emergency. Do not wait to see if the symptoms will go away. Get medical help right away. Call your local emergency services (911 in the [place] Do not drive yourself to the hospital. Summary Gastroesophageal reflux happens when acid from the stomach flows up into the esophagus. GERD is a disease in which the reflux happens often, causes frequent or severe symptoms, or causes problems such as damage to the esophagus. Treatment for this condition may vary depending on how severe your symptoms are. Your health care provider may recommend diet and lifestyle changes, medicine, or surgery. Contact a health care provider if you have new or worsening symptoms. Take over-the-counter and prescription medicines only as told  by your health care provider. Do not take aspirin, ibuprofen, or other NSAIDs unless your health care provider told you to do so. Keep all follow-up visits as told by your health care provider. This is important. This information is not intended to replace advice given to you by your health care provider. Make sure you discuss any questions you have with your health care provider. Document Revised: 11/06/2019 Document Reviewed: 11/06/2019 Elsevier Patient Education  [place].

## 2021-08-25 ENCOUNTER — Other Ambulatory Visit: Payer: Self-pay | Admitting: Internal Medicine

## 2021-08-25 DIAGNOSIS — I10 Essential (primary) hypertension: Secondary | ICD-10-CM

## 2021-09-24 ENCOUNTER — Other Ambulatory Visit: Payer: Self-pay | Admitting: Gastroenterology

## 2021-10-08 ENCOUNTER — Telehealth: Payer: Self-pay | Admitting: Physician Assistant

## 2021-10-08 MED ORDER — AMBULATORY NON FORMULARY MEDICATION: 1 refills | Status: DC

## 2021-10-08 MED ORDER — HYDROCORTISONE ACETATE 25 MG RE SUPP: 25.0000 mg | Freq: Two times a day (BID) | RECTAL | 1 refills | Status: DC

## 2021-10-08 NOTE — Telephone Encounter (Signed)
Patient called to follow up on previous request.

## 2021-10-08 NOTE — Telephone Encounter (Signed)
Diltiazem gel with lidocaine faxed to [facility]

## 2021-11-03 ENCOUNTER — Other Ambulatory Visit: Payer: Self-pay | Admitting: Internal Medicine

## 2021-11-03 DIAGNOSIS — Z1231 Encounter for screening mammogram for malignant neoplasm of breast: Secondary | ICD-10-CM

## 2021-11-04 ENCOUNTER — Ambulatory Visit
Admission: RE | Admit: 2021-11-04 | Discharge: 2021-11-04 | Disposition: A | Discharge status: Home / Self Care | Payer: No Typology Code available for payment source | Source: Ambulatory Visit | Attending: Internal Medicine | Admitting: Internal Medicine

## 2021-11-04 DIAGNOSIS — Z1231 Encounter for screening mammogram for malignant neoplasm of breast: Secondary | ICD-10-CM

## 2021-12-05 ENCOUNTER — Other Ambulatory Visit: Payer: Self-pay | Admitting: Internal Medicine

## 2021-12-05 DIAGNOSIS — I10 Essential (primary) hypertension: Secondary | ICD-10-CM

## 2021-12-12 ENCOUNTER — Telehealth: Payer: Self-pay | Admitting: Internal Medicine

## 2021-12-12 DIAGNOSIS — I10 Essential (primary) hypertension: Secondary | ICD-10-CM

## 2021-12-12 MED ORDER — INDAPAMIDE 1.25 MG PO TABS: ORAL_TABLET | ORAL | 0 refills | Status: DC

## 2021-12-12 NOTE — Telephone Encounter (Signed)
Pt called in requesting refill on indapamide (LOZOL) 1.25 MG tablet. Advised pt she needs ov. She scheduled ov for 12/16/21 and would like to know if she can get a short supply sent to the pharmacy until her visit on Tuesday.   Pharmacy:  [facility] [address], [place] Phone:  [phone]  Fax:  [phone]

## 2021-12-12 NOTE — Telephone Encounter (Signed)
Done erx 

## 2021-12-16 ENCOUNTER — Other Ambulatory Visit: Payer: Self-pay | Admitting: Internal Medicine

## 2021-12-16 ENCOUNTER — Ambulatory Visit: Payer: No Typology Code available for payment source | Admitting: Emergency Medicine

## 2021-12-16 DIAGNOSIS — I10 Essential (primary) hypertension: Secondary | ICD-10-CM

## 2021-12-16 MED ORDER — INDAPAMIDE 1.25 MG PO TABS
ORAL_TABLET | ORAL | 0 refills | Status: DC
Start: 2021-12-16 — End: 2022-04-04

## 2021-12-16 NOTE — Telephone Encounter (Signed)
Patient got a 30 day refill of her BP medication sent to pharmacy. Patient has an appt to see you on Sept 21st. Patient states she will need a refill of her BP med because she will run out before her next appt

## 2022-01-13 ENCOUNTER — Other Ambulatory Visit: Payer: Self-pay

## 2022-01-13 ENCOUNTER — Telehealth: Payer: Self-pay | Admitting: Gastroenterology

## 2022-01-13 DIAGNOSIS — K5900 Constipation, unspecified: Secondary | ICD-10-CM

## 2022-01-13 DIAGNOSIS — K602 Anal fissure, unspecified: Secondary | ICD-10-CM

## 2022-01-13 NOTE — Telephone Encounter (Signed)
Pt is calling states she is having sever stomach issues and cant wait until 10/5 with her appt with APP. Please advise, thank you.

## 2022-01-13 NOTE — Telephone Encounter (Signed)
Spoke with patient regarding PA recommendations. Prescription sent to [facility] ordered. Linzess samples placed at second floor front desk. Patient has been advised on when/where to go for xray and samples.

## 2022-01-13 NOTE — Telephone Encounter (Signed)
Please send in Diltiazem 2%/ Lidocaine 5% to [facility] for compound Apply pea size amount 1/2 to 1 inch inside rectum 3-4 times daily x1 month.   Can get recticare OTC which is just lidocaine.   Get 2 view AB Xray to evaluate for stool burden/obstruction, can add on miralax 1-2 x a day in the mean time, can pick up linzess samples 145 mcg when she comes for the Xray and can start after result.   Go to the ER if unable to pass gas, severe AB pain, unable to hold down food, any shortness of breath of chest pain.

## 2022-01-13 NOTE — Telephone Encounter (Signed)
Patient called in with complaints of ongoing rectal burning & constipation with lower abd pain post bowel movements. She is currently using Prep H suppositories & OTC stool softener. She was prescribed lidocaine at the time of her OV on 07/11/21 which did help, but she is out. Patient currently has f/u scheduled with Jaclyn Shaggy, NP on 02/12/22 at 9:00 am. Will route to PA for further recommendations.

## 2022-01-14 ENCOUNTER — Ambulatory Visit (INDEPENDENT_AMBULATORY_CARE_PROVIDER_SITE_OTHER)
Admission: RE | Admit: 2022-01-14 | Discharge: 2022-01-14 | Disposition: A | Discharge status: Home / Self Care | Payer: No Typology Code available for payment source | Source: Ambulatory Visit | Attending: Physician Assistant | Admitting: Physician Assistant

## 2022-01-14 DIAGNOSIS — K602 Anal fissure, unspecified: Secondary | ICD-10-CM

## 2022-01-14 DIAGNOSIS — K5900 Constipation, unspecified: Secondary | ICD-10-CM | POA: Diagnosis not present

## 2022-01-26 ENCOUNTER — Telehealth: Payer: Self-pay | Admitting: Internal Medicine

## 2022-01-26 NOTE — Telephone Encounter (Signed)
Patient called and states that she has some covid symptoms but her covid test was negative. She wants to know if something can be called in. She has an appointment with Dr. Ronnald Ramp on Thursday.  Call back number is [phone]

## 2022-01-29 ENCOUNTER — Ambulatory Visit (INDEPENDENT_AMBULATORY_CARE_PROVIDER_SITE_OTHER): Payer: No Typology Code available for payment source | Admitting: Internal Medicine

## 2022-01-29 ENCOUNTER — Encounter: Payer: Self-pay | Admitting: Internal Medicine

## 2022-01-29 ENCOUNTER — Ambulatory Visit (INDEPENDENT_AMBULATORY_CARE_PROVIDER_SITE_OTHER): Payer: No Typology Code available for payment source

## 2022-01-29 VITALS — BP 124/82 | HR 95 | Temp 97.9°F | Ht 63.0 in | Wt 228.0 lb

## 2022-01-29 DIAGNOSIS — M170 Bilateral primary osteoarthritis of knee: Secondary | ICD-10-CM

## 2022-01-29 DIAGNOSIS — J189 Pneumonia, unspecified organism: Secondary | ICD-10-CM

## 2022-01-29 DIAGNOSIS — Z23 Encounter for immunization: Secondary | ICD-10-CM

## 2022-01-29 DIAGNOSIS — R051 Acute cough: Secondary | ICD-10-CM

## 2022-01-29 MED ORDER — AZITHROMYCIN 500 MG PO TABS: 500.0000 mg | ORAL_TABLET | Freq: Every day | ORAL | 0 refills | Status: AC

## 2022-01-29 MED ORDER — HYDROCODONE BIT-HOMATROP MBR 5-1.5 MG/5ML PO SOLN: 5.0000 mL | Freq: Three times a day (TID) | ORAL | 0 refills | Status: AC | PRN

## 2022-01-29 NOTE — Patient Instructions (Signed)

## 2022-01-29 NOTE — Progress Notes (Signed)
Subjective:  Patient ID: Betty Bell, female    DOB: 1961/02/15  Age: 60 y.o. MRN: [id number]  CC: Cough and Osteoarthritis   HPI Betty Bell presents for f/up - She complains of a 5-day history of nonproductive cough.  Her COVID test was negative.  Outpatient Medications Prior to Visit  Medication Sig Dispense Refill   acetaminophen (TYLENOL) 500 MG tablet Take 500 mg by mouth every 6 (six) hours as needed.     AMBULATORY NON FORMULARY MEDICATION Diltiazem gel 2% with lidocaine  Apply a pea sized amount into your rectum three times daily as needed Dispense 30 GM zero refill 30 g 1   cetirizine (ZYRTEC) 10 MG tablet Take 10 mg by mouth daily.     famotidine (PEPCID) 20 MG tablet Take 1 tablet (20 mg total) by mouth 2 (two) times daily. 180 tablet 3   hydrocortisone (ANUSOL-HC) 25 MG suppository Place 1 suppository (25 mg total) rectally 2 (two) times daily. 12 suppository 1   indapamide (LOZOL) 1.25 MG tablet TAKE 1 TABLET(1.25 MG) BY MOUTH DAILY 90 tablet 0   clobetasol ointment (TEMOVATE) 7.61 % Apply 1 application topically 2 (two) times daily. (Patient not taking: Reported on 07/11/2021) 30 g 0   No facility-administered medications prior to visit.    ROS Review of Systems  Constitutional: Negative.  Negative for chills, diaphoresis, fatigue and fever.  HENT: Negative.  Negative for sore throat and trouble swallowing.   Eyes: Negative.   Respiratory:  Positive for cough. Negative for chest tightness, shortness of breath and wheezing.   Cardiovascular:  Negative for chest pain, palpitations and leg swelling.  Gastrointestinal:  Negative for abdominal pain, diarrhea, nausea and vomiting.  Endocrine: Negative.   Genitourinary: Negative.   Musculoskeletal:  Positive for arthralgias and gait problem. Negative for myalgias.  Skin: Negative.   Allergic/Immunologic: Negative.   Neurological:  Positive for headaches. Negative for dizziness, weakness and  light-headedness.  Hematological:  Negative for adenopathy. Does not bruise/bleed easily.  Psychiatric/Behavioral: Negative.      Objective:  BP 124/82 (BP Location: Right Arm, Patient Position: Sitting, Cuff Size: Normal)   Pulse 95   Temp 97.9 F (36.6 C) (Oral)   Ht '5\' 3"'$  (1.6 m)   Wt 228 lb (103.4 kg)   LMP 03/26/2011   SpO2 98%   BMI 40.39 kg/m   BP Readings from Last 3 Encounters:  01/29/22 124/82  07/11/21 130/74  07/01/21 (!) 146/82    Wt Readings from Last 3 Encounters:  01/29/22 228 lb (103.4 kg)  07/11/21 226 lb 8 oz (102.7 kg)  06/02/21 228 lb (103.4 kg)    Physical Exam Vitals reviewed.  Constitutional:      Appearance: She is not ill-appearing.  Eyes:     General: No scleral icterus.    Conjunctiva/sclera: Conjunctivae normal.  Cardiovascular:     Rate and Rhythm: Normal rate and regular rhythm.     Heart sounds: No murmur heard. Pulmonary:     Effort: Pulmonary effort is normal.     Breath sounds: No stridor. No wheezing, rhonchi or rales.  Abdominal:     General: Abdomen is flat.     Palpations: There is no mass.     Tenderness: There is no abdominal tenderness. There is no guarding.     Hernia: No hernia is present.  Musculoskeletal:        General: Normal range of motion.     Cervical back: Neck supple.  Right lower leg: No edema.     Left lower leg: No edema.  Lymphadenopathy:     Cervical: No cervical adenopathy.  Skin:    General: Skin is warm and dry.  Neurological:     General: No focal deficit present.     Mental Status: She is alert.     Lab Results  Component Value Date   WBC 8.7 03/21/2020   HGB 13.5 03/21/2020   HCT 41.1 03/21/2020   PLT 323.0 03/21/2020   GLUCOSE 96 10/23/2020   CHOL 192 03/21/2020   TRIG 93.0 03/21/2020   HDL 55.20 03/21/2020   LDLCALC 118 (H) 03/21/2020   ALT 25 03/21/2020   AST 25 03/21/2020   NA 138 10/23/2020   K 3.6 10/23/2020   CL 100 10/23/2020   CREATININE 0.60 10/23/2020   BUN 11  10/23/2020   CO2 27 10/23/2020   TSH 1.11 10/23/2020   HGBA1C 5.9 03/21/2020    DG Abd 2 Views  Result Date: 01/15/2022 CLINICAL DATA:  Constipation. Chronic constipation AND abd pain, hx of GERD. EXAM: ABDOMEN - 2 VIEW COMPARISON:  X-ray abdomen 01/18/2019. FINDINGS: The bowel gas pattern is normal. Stool within the ascending colon and rectum. No increased stool burden noted. There is no evidence of free air. No radio-opaque calculi or other significant radiographic abnormality is seen. IMPRESSION: Nonobstructive bowel gas pattern. Electronically Signed   By: Iven Finn M.D.   On: 01/15/2022 01:28    DG Chest 2 View  Result Date: 01/29/2022 CLINICAL DATA:  Provided history: Acute cough. Additional history provided: History of asthma, hypertension. Nonsmoker. EXAM: CHEST - 2 VIEW COMPARISON:  Prior chest radiographs 06/02/2021 and earlier. FINDINGS: Heart size within normal limits. Mild ill-defined opacity within the left lung base appreciated on the PA radiograph. No airspace consolidation on the left. No evidence of pleural effusion or pneumothorax. No acute bony abnormality identified. IMPRESSION: Mild ill-defined opacity within the left lung base. This may reflect atelectasis or early pneumonia. Electronically Signed   By: Kellie Simmering D.O.   On: 01/29/2022 14:34     Assessment & Plan:   [name] was seen today for cough and osteoarthritis.  Diagnoses and all orders for this visit:  Acute cough- Her chest x-ray is suspicious for left lower lobe pneumonia.  She has multiple drug allergies.  Will treat with azithromycin. -     DG Chest 2 View; Future  Primary osteoarthritis of both knees -     Ambulatory referral to Orthopedic Surgery  Pneumonia of left lower lobe due to infectious organism -     azithromycin (ZITHROMAX) 500 MG tablet; Take 1 tablet (500 mg total) by mouth daily for 3 days. -     HYDROcodone bit-homatropine (HYCODAN) 5-1.5 MG/5ML syrup; Take 5 mLs by mouth every 8  (eight) hours as needed for up to 8 days for cough.  Other orders -     Flu Vaccine QUAD 6+ mos PF IM (Fluarix Quad PF)   I am having Betty Bell "Betty Bell" start on azithromycin and HYDROcodone bit-homatropine. I am also having her maintain her cetirizine, acetaminophen, clobetasol ointment, famotidine, hydrocortisone, AMBULATORY NON FORMULARY MEDICATION, and indapamide.  Meds ordered this encounter  Medications   azithromycin (ZITHROMAX) 500 MG tablet    Sig: Take 1 tablet (500 mg total) by mouth daily for 3 days.    Dispense:  3 tablet    Refill:  0   HYDROcodone bit-homatropine (HYCODAN) 5-1.5 MG/5ML syrup    Sig:  Take 5 mLs by mouth every 8 (eight) hours as needed for up to 8 days for cough.    Dispense:  120 mL    Refill:  0     Follow-up: Return in about 3 weeks (around 02/19/2022).  Scarlette Calico, MD

## 2022-02-11 NOTE — Progress Notes (Unsigned)
02/11/2022 Betty Bell [id number] 09/29/1960   Chief Complaint:  History of Present Illness: Betty Bell. Betty Bell is a 61 year old female with a past medical history of  She is followed by Dr. Tarri Glenn.       Latest Ref Rng & Units 03/21/2020    4:27 PM 11/10/2019    3:38 PM 01/18/2019    4:51 PM  CBC  WBC 4.0 - 10.5 K/uL 8.7  8.3  6.8   Hemoglobin 12.0 - 15.0 g/dL 13.5  13.0  12.9   Hematocrit 36.0 - 46.0 % 41.1  39.8  39.8   Platelets 150.0 - 400.0 K/uL 323.0  275.0  284.0        Latest Ref Rng & Units 10/23/2020   11:12 AM 03/21/2020    4:27 PM 11/10/2019    3:38 PM  CMP  Glucose 70 - 99 mg/dL 96  89  123   BUN 6 - 23 mg/dL '11  11  10   '$ Creatinine 0.40 - 1.20 mg/dL 0.60  0.65  0.64   Sodium 135 - 145 mEq/L 138  138  141   Potassium 3.5 - 5.1 mEq/L 3.6  4.5  3.5   Chloride 96 - 112 mEq/L 100  101  104   CO2 19 - 32 mEq/L '27  30  29   '$ Calcium 8.4 - 10.5 mg/dL 9.7  9.6  9.6   Total Protein 6.0 - 8.3 g/dL  7.6  7.4   Total Bilirubin 0.2 - 1.2 mg/dL  0.4  0.3   Alkaline Phos 39 - 117 U/L  75  77   AST 0 - 37 U/L  25  18   ALT 0 - 35 U/L  25  13     Colonoscopy 01/08/2020: - One 5 mm polyp in the descending colon, removed with a cold snare. Resected and retrieved. - Two 1 to 2 mm polyps in the ascending colon and in the cecum, removed with a cold snare. Resected and retrieved. - The examination was otherwise normal on direct and retroflexion views. - 3 year colonoscopy  TUBULAR ADENOMA(S). - NO HIGH GRADE DYSPLASIA OR CARCINOMA  EGD 02/28/2018: - Normal esophagus. - Non-bleeding gastric ulcer with no stigmata of bleeding. - Non-bleeding erosive gastropathy. Biopsied. - Normal examined duodenum. GASTRIC ANTRAL AND OXYNTIC MUCOSA WITH NO SPECIFIC HISTOPATHOLOGIC CHANGES - WARTHIN STARRY STAIN IS NEGATIVE FOR HELICOBACTER PYLORI  Past Medical History:  Diagnosis Date   Anxiety    Asthma    Cervical dysplasia    High Grade   CIN I (cervical  intraepithelial neoplasia I)    Colon polyp 2015   TUBULAR ADENOMA   Endometrial polyp    GERD (gastroesophageal reflux disease)    Hydrosalpinx    Left and Right   Hypertension    IBS (irritable bowel syndrome)    Pneumonia    Rectal burning    Seasonal allergies    Uterine fibroid    Past Surgical History:  Procedure Laterality Date   CARPAL TUNNEL RELEASE Left    x 2   COLONOSCOPY  2015   DILATION AND CURETTAGE OF UTERUS     HYSTEROSCOPY     LASER ABLATION OF THE CERVIX     PELVIC LAPAROSCOPY     DL   UTERINE FIBROID EMBOLIZATION     wrist cystectomy Right        Current Medications, Allergies, Past Medical History, Past Surgical History, Family History and Social History  were reviewed in [place] record.   Review of Systems:   Constitutional: Negative for fever, sweats, chills or weight loss.  Respiratory: Negative for shortness of breath.   Cardiovascular: Negative for chest pain, palpitations and leg swelling.  Gastrointestinal: See HPI.  Musculoskeletal: Negative for back pain or muscle aches.  Neurological: Negative for dizziness, headaches or paresthesias.    Physical Exam: LMP 03/26/2011  General: Well developed, w   ***female in no acute distress. Head: Normocephalic and atraumatic. Eyes: No scleral icterus. Conjunctiva pink . Ears: Normal auditory acuity. Mouth: Dentition intact. No ulcers or lesions.  Lungs: Clear throughout to auscultation. Heart: Regular rate and rhythm, no murmur. Abdomen: Soft, nontender and nondistended. No masses or hepatomegaly. Normal bowel sounds x 4 quadrants.  Rectal: *** Musculoskeletal: Symmetrical with no gross deformities. Extremities: No edema. Neurological: Alert oriented x 4. No focal deficits.  Psychological: Alert and cooperative. Normal mood and affect  Assessment and Recommendations: ***

## 2022-02-12 ENCOUNTER — Ambulatory Visit (INDEPENDENT_AMBULATORY_CARE_PROVIDER_SITE_OTHER): Payer: No Typology Code available for payment source | Admitting: Nurse Practitioner

## 2022-02-12 ENCOUNTER — Encounter: Payer: Self-pay | Admitting: Nurse Practitioner

## 2022-02-12 ENCOUNTER — Other Ambulatory Visit (INDEPENDENT_AMBULATORY_CARE_PROVIDER_SITE_OTHER): Payer: No Typology Code available for payment source

## 2022-02-12 VITALS — BP 138/74 | HR 80 | Ht 65.0 in | Wt 232.1 lb

## 2022-02-12 DIAGNOSIS — R1032 Left lower quadrant pain: Secondary | ICD-10-CM | POA: Diagnosis not present

## 2022-02-12 DIAGNOSIS — R1010 Upper abdominal pain, unspecified: Secondary | ICD-10-CM | POA: Diagnosis not present

## 2022-02-12 DIAGNOSIS — K602 Anal fissure, unspecified: Secondary | ICD-10-CM | POA: Diagnosis not present

## 2022-02-12 DIAGNOSIS — K6289 Other specified diseases of anus and rectum: Secondary | ICD-10-CM

## 2022-02-12 LAB — COMPREHENSIVE METABOLIC PANEL
ALT: 12 U/L (ref 0–35)
AST: 15 U/L (ref 0–37)
Albumin: 4 g/dL (ref 3.5–5.2)
Alkaline Phosphatase: 74 U/L (ref 39–117)
BUN: 9 mg/dL (ref 6–23)
CO2: 29 mEq/L (ref 19–32)
Calcium: 9.3 mg/dL (ref 8.4–10.5)
Chloride: 103 mEq/L (ref 96–112)
Creatinine, Ser: 0.7 mg/dL (ref 0.40–1.20)
GFR: 93.04 mL/min (ref >60.00)
Glucose, Bld: 108 mg/dL — ABNORMAL HIGH (ref 70–99)
Potassium: 3.8 mEq/L (ref 3.5–5.1)
Sodium: 139 mEq/L (ref 135–145)
Total Bilirubin: 0.6 mg/dL (ref 0.2–1.2)
Total Protein: 7.2 g/dL (ref 6.0–8.3)

## 2022-02-12 LAB — CBC WITH DIFFERENTIAL/PLATELET
Basophils Absolute: 0.1 10*3/uL (ref 0.0–0.1)
Basophils Relative: 1.1 % (ref 0.0–3.0)
Eosinophils Absolute: 0.1 10*3/uL (ref 0.0–0.7)
Eosinophils Relative: 2.2 % (ref 0.0–5.0)
HCT: 37.5 % (ref 36.0–46.0)
Hemoglobin: 12.4 g/dL (ref 12.0–15.0)
Lymphocytes Relative: 32.3 % (ref 12.0–46.0)
Lymphs Abs: 2.2 10*3/uL (ref 0.7–4.0)
MCHC: 33.1 g/dL (ref 30.0–36.0)
MCV: 82.8 fl (ref 78.0–100.0)
Monocytes Absolute: 0.7 10*3/uL (ref 0.1–1.0)
Monocytes Relative: 11 % (ref 3.0–12.0)
Neutro Abs: 3.6 10*3/uL (ref 1.4–7.7)
Neutrophils Relative %: 53.4 % (ref 43.0–77.0)
Platelets: 344 10*3/uL (ref 150.0–400.0)
RBC: 4.53 Mil/uL (ref 3.87–5.11)
RDW: 14.4 % (ref 11.5–15.5)
WBC: 6.8 10*3/uL (ref 4.0–10.5)

## 2022-02-12 MED ORDER — DICYCLOMINE HCL 10 MG PO CAPS: ORAL_CAPSULE | ORAL | 1 refills | Status: DC

## 2022-02-12 NOTE — Patient Instructions (Addendum)
Apply a small amount of Desitin inside the anal opening and to the external anal area tid as needed for anal or hemorrhoidal irritation/bleeding.   You have been scheduled for a CT scan of the abdomen and pelvis at [facility], 1st floor Radiology. You are scheduled on ____________ at ______________. You should arrive 30 minutes prior to your appointment time for registration.  We are giving you 2 bottles of contrast today that you will need to drink before arriving for the exam. The solution may taste better if refrigerated so put them in the refrigerator when you get home, but do NOT add ice or any other liquid to this solution as that would dilute it. Shake well before drinking.   Please follow the written instructions below on the day of your exam:   1) Do not eat anything after __________________ (4 hours prior to your test)   2) Drink 1 bottle of contrast @ _________________ (2 hours prior to your exam)  Remember to shake well before drinking and do NOT pour over ice.     Drink 1 bottle of contrast @ ___________________ (1 hour prior to your exam)   You may take any medications as prescribed with a small amount of water, if necessary. If you take any of the following medications: METFORMIN, GLUCOPHAGE, GLUCOVANCE, AVANDAMET, RIOMET, FORTAMET, [place] MET, JANUMET, GLUMETZA or METAGLIP, you MAY be asked to HOLD this medication 48 hours AFTER the exam.   The purpose of you drinking the oral contrast is to aid in the visualization of your intestinal tract. The contrast solution may cause some diarrhea. Depending on your individual set of symptoms, you may also receive an intravenous injection of x-ray contrast/dye. Plan on being at [facility] for 45 minutes or longer, depending on the type of exam you are having performed.   If you have any questions regarding your exam or if you need to reschedule, you may call [facility] at [phone] between the hours of 8:00 am and 5:00  pm, Monday-Friday.   You will be contacted by [facility] in the next 2 days to arrange a CT scan.  The number on your caller ID will be [phone], please answer when they call.  If you have not heard from them in 2 days please call [phone] to schedule.      Your provider has requested that you go to the basement level for lab work before leaving today. Press "B" on the elevator. The lab is located at the first door on the left as you exit the elevator.  Due to recent changes in healthcare laws, you may see the results of your imaging and laboratory studies on MyChart before your provider has had a chance to review them.  We understand that in some cases there may be results that are confusing or concerning to you. Not all laboratory results come back in the same time frame and the provider may be waiting for multiple results in order to interpret others.  Please give [place] 48 hours in order for your provider to thoroughly review all the results before contacting the office for clarification of your results.   We have sent the following medications to your pharmacy for you to pick up at your convenience: Dicyclomine  Use over the counter Miralax daily at bedtime.  I appreciate the opportunity to care for you. Carl Best, CRNP

## 2022-02-16 ENCOUNTER — Ambulatory Visit (INDEPENDENT_AMBULATORY_CARE_PROVIDER_SITE_OTHER): Payer: No Typology Code available for payment source | Admitting: Internal Medicine

## 2022-02-16 ENCOUNTER — Encounter: Payer: Self-pay | Admitting: Internal Medicine

## 2022-02-16 VITALS — BP 132/80 | HR 89 | Temp 97.7°F | Ht 63.0 in | Wt 233.0 lb

## 2022-02-16 DIAGNOSIS — J189 Pneumonia, unspecified organism: Secondary | ICD-10-CM

## 2022-02-16 MED ORDER — BENZONATATE 200 MG PO CAPS: 200.0000 mg | ORAL_CAPSULE | Freq: Three times a day (TID) | ORAL | 0 refills | Status: DC | PRN

## 2022-02-16 NOTE — Progress Notes (Signed)
   Subjective:   Patient ID: Betty Bell, female    DOB: [birth year], 61 y.o.   MRN: [id number]  HPI The patient is a 61 YO female coming in for cough. Seen by PCP 10/29/21 and given z-pack and cough syrup. CXR with possible early CAP. Still coughing some but other symptoms improving.  Review of Systems  Constitutional: Negative.   HENT: Negative.    Eyes: Negative.   Respiratory:  Positive for cough. Negative for chest tightness and shortness of breath.   Cardiovascular:  Negative for chest pain, palpitations and leg swelling.  Gastrointestinal:  Negative for abdominal distention, abdominal pain, constipation, diarrhea, nausea and vomiting.  Musculoskeletal: Negative.   Skin: Negative.   Neurological: Negative.   Psychiatric/Behavioral: Negative.      Objective:  Physical Exam Constitutional:      Appearance: She is well-developed. She is obese.  HENT:     Head: Normocephalic and atraumatic.  Cardiovascular:     Rate and Rhythm: Normal rate and regular rhythm.  Pulmonary:     Effort: Pulmonary effort is normal. No respiratory distress.     Breath sounds: Normal breath sounds. No wheezing or rales.  Abdominal:     General: Bowel sounds are normal. There is no distension.     Palpations: Abdomen is soft.     Tenderness: There is no abdominal tenderness. There is no rebound.  Musculoskeletal:     Cervical back: Normal range of motion.  Skin:    General: Skin is warm and dry.  Neurological:     Mental Status: She is alert and oriented to person, place, and time.     Coordination: Coordination normal.     Vitals:   02/16/22 1417  BP: 132/80  Pulse: 89  Temp: 97.7 F (36.5 C)  SpO2: 97%  Weight: 233 lb (105.7 kg)  Height: '5\' 3"'$  (1.6 m)    Assessment & Plan:

## 2022-02-16 NOTE — Patient Instructions (Signed)
We have sent in tessalon perles to use for cough up to 3 times a day.  We have ordered an x-ray to come get around 10/26

## 2022-02-16 NOTE — Assessment & Plan Note (Signed)
Ordered CXR today for follow up around 03/05/22. Rx tessalon perles to use for cough. Overall she is improving.

## 2022-02-18 ENCOUNTER — Ambulatory Visit (HOSPITAL_COMMUNITY): Payer: No Typology Code available for payment source

## 2022-02-27 ENCOUNTER — Other Ambulatory Visit (HOSPITAL_COMMUNITY): Payer: Self-pay

## 2022-02-27 ENCOUNTER — Telehealth: Payer: Self-pay

## 2022-02-27 NOTE — Telephone Encounter (Signed)
PA renewal initiated automatically by CoverMyMeds.  Submitted a Prior Authorization request to [employer] for Famotidine '20MG'$  tablets via CoverMyMeds. Will update once we receive a response.   Key: [id number]

## 2022-03-02 NOTE — Progress Notes (Signed)
Reviewed and agree with management plans. Agree with plans for cross-sectional imaging.   Ayat Drenning L. Tarri Glenn, MD, MPH

## 2022-03-02 NOTE — Telephone Encounter (Signed)
Patient Advocate Encounter  Prior Authorization for Famotidine '20MG'$  tablets has been approved.    PA# [id number] Key: [id number]  Effective dates: 02/27/2022 through 02/28/2023

## 2022-03-11 ENCOUNTER — Ambulatory Visit (HOSPITAL_COMMUNITY): Payer: No Typology Code available for payment source

## 2022-03-30 ENCOUNTER — Ambulatory Visit (INDEPENDENT_AMBULATORY_CARE_PROVIDER_SITE_OTHER): Payer: No Typology Code available for payment source

## 2022-03-30 DIAGNOSIS — J189 Pneumonia, unspecified organism: Secondary | ICD-10-CM | POA: Diagnosis not present

## 2022-04-04 ENCOUNTER — Other Ambulatory Visit: Payer: Self-pay | Admitting: Internal Medicine

## 2022-04-04 DIAGNOSIS — I10 Essential (primary) hypertension: Secondary | ICD-10-CM

## 2022-04-20 ENCOUNTER — Ambulatory Visit (INDEPENDENT_AMBULATORY_CARE_PROVIDER_SITE_OTHER): Payer: No Typology Code available for payment source | Admitting: Internal Medicine

## 2022-04-20 ENCOUNTER — Telehealth: Payer: Self-pay | Admitting: Internal Medicine

## 2022-04-20 ENCOUNTER — Encounter: Payer: Self-pay | Admitting: Internal Medicine

## 2022-04-20 VITALS — BP 140/78 | HR 97 | Temp 98.3°F | Resp 16 | Ht 63.0 in | Wt 234.0 lb

## 2022-04-20 DIAGNOSIS — J0110 Acute frontal sinusitis, unspecified: Secondary | ICD-10-CM | POA: Insufficient documentation

## 2022-04-20 DIAGNOSIS — I10 Essential (primary) hypertension: Secondary | ICD-10-CM | POA: Diagnosis not present

## 2022-04-20 MED ORDER — AZITHROMYCIN 500 MG PO TABS: 500.0000 mg | ORAL_TABLET | Freq: Every day | ORAL | 0 refills | Status: AC

## 2022-04-20 NOTE — Telephone Encounter (Signed)
Pt scheduled for today 12/11 @ 3.40pm.

## 2022-04-20 NOTE — Patient Instructions (Signed)

## 2022-04-20 NOTE — Progress Notes (Unsigned)
Subjective:  Patient ID: Betty Bell, female    DOB: [birth year]  Age: 61 y.o. MRN: [id number]  CC: URI and Hypertension   HPI Betty Bell presents for f/up -  She complains of a 4-day history of sore throat, facial pain, nonproductive cough, and thick yellow-green nasal phlegm.  She denies fever, chills, night sweats, chest pain, or dyspnea on exertion.  Outpatient Medications Prior to Visit  Medication Sig Dispense Refill   acetaminophen (TYLENOL) 500 MG tablet Take 500 mg by mouth every 6 (six) hours as needed.     AMBULATORY NON FORMULARY MEDICATION Diltiazem gel 2% with lidocaine  Apply a pea sized amount into your rectum three times daily as needed Dispense 30 GM zero refill 30 g 1   benzonatate (TESSALON) 200 MG capsule Take 1 capsule (200 mg total) by mouth 3 (three) times daily as needed. 60 capsule 0   cetirizine (ZYRTEC) 10 MG tablet Take 10 mg by mouth daily.     clobetasol ointment (TEMOVATE) 6.30 % Apply 1 application topically 2 (two) times daily. 30 g 0   dicyclomine (BENTYL) 10 MG capsule Take one capsule every 8 hours as needed for abdominal pain 30 capsule 1   famotidine (PEPCID) 20 MG tablet Take 1 tablet (20 mg total) by mouth 2 (two) times daily. 180 tablet 3   hydrocortisone (ANUSOL-HC) 25 MG suppository Place 1 suppository (25 mg total) rectally 2 (two) times daily. 12 suppository 1   indapamide (LOZOL) 1.25 MG tablet TAKE 1 TABLET(1.25 MG) BY MOUTH DAILY 90 tablet 0   No facility-administered medications prior to visit.    ROS Review of Systems  Constitutional:  Negative for appetite change, chills, diaphoresis, fatigue and fever.  HENT:  Positive for congestion, postnasal drip, rhinorrhea, sinus pressure, sinus pain and sore throat. Negative for nosebleeds and trouble swallowing.   Respiratory:  Positive for cough. Negative for chest tightness, shortness of breath and wheezing.   Cardiovascular:  Negative for chest pain, palpitations  and leg swelling.  Gastrointestinal: Negative.  Negative for abdominal pain, diarrhea, nausea and vomiting.  Genitourinary: Negative.  Negative for difficulty urinating and dysuria.  Musculoskeletal: Negative.   Skin: Negative.   Neurological:  Negative for dizziness and weakness.  Hematological:  Negative for adenopathy. Does not bruise/bleed easily.  Psychiatric/Behavioral: Negative.      Objective:  BP (!) 140/78 (BP Location: Right Arm, Patient Position: Sitting, Cuff Size: Large)   Pulse 97   Temp 98.3 F (36.8 C) (Oral)   Resp 16   Ht '5\' 3"'$  (1.6 m)   Wt 234 lb (106.1 kg)   LMP 03/26/2011   SpO2 97%   BMI 41.45 kg/m   BP Readings from Last 3 Encounters:  04/20/22 (!) 140/78  02/16/22 132/80  02/12/22 138/74    Wt Readings from Last 3 Encounters:  04/20/22 234 lb (106.1 kg)  02/16/22 233 lb (105.7 kg)  02/12/22 232 lb 2 oz (105.3 kg)    Physical Exam Vitals reviewed.  Constitutional:      General: She is not in acute distress.    Appearance: She is not ill-appearing, toxic-appearing or diaphoretic.  HENT:     Nose: Nose normal.     Mouth/Throat:     Mouth: Mucous membranes are moist.  Eyes:     General: No scleral icterus.    Conjunctiva/sclera: Conjunctivae normal.  Cardiovascular:     Rate and Rhythm: Normal rate and regular rhythm.     Heart sounds: No  murmur heard. Pulmonary:     Effort: Pulmonary effort is normal.     Breath sounds: No stridor. No wheezing, rhonchi or rales.  Abdominal:     General: Abdomen is flat.     Palpations: There is no mass.     Tenderness: There is no abdominal tenderness. There is no guarding.     Hernia: No hernia is present.  Musculoskeletal:        General: Normal range of motion.     Cervical back: Neck supple.     Right lower leg: No edema.     Left lower leg: No edema.  Lymphadenopathy:     Cervical: No cervical adenopathy.  Skin:    General: Skin is warm and dry.  Neurological:     General: No focal deficit  present.     Mental Status: She is alert.  Psychiatric:        Mood and Affect: Mood normal.        Behavior: Behavior normal.     Lab Results  Component Value Date   WBC 6.8 02/12/2022   HGB 12.4 02/12/2022   HCT 37.5 02/12/2022   PLT 344.0 02/12/2022   GLUCOSE 108 (H) 02/12/2022   CHOL 192 03/21/2020   TRIG 93.0 03/21/2020   HDL 55.20 03/21/2020   LDLCALC 118 (H) 03/21/2020   ALT 12 02/12/2022   AST 15 02/12/2022   NA 139 02/12/2022   K 3.8 02/12/2022   CL 103 02/12/2022   CREATININE 0.70 02/12/2022   BUN 9 02/12/2022   CO2 29 02/12/2022   TSH 1.11 10/23/2020   HGBA1C 5.9 03/21/2020    DG Abd 2 Views  Result Date: 01/15/2022 CLINICAL DATA:  Constipation. Chronic constipation AND abd pain, hx of GERD. EXAM: ABDOMEN - 2 VIEW COMPARISON:  X-ray abdomen 01/18/2019. FINDINGS: The bowel gas pattern is normal. Stool within the ascending colon and rectum. No increased stool burden noted. There is no evidence of free air. No radio-opaque calculi or other significant radiographic abnormality is seen. IMPRESSION: Nonobstructive bowel gas pattern. Electronically Signed   By: Iven Finn M.D.   On: 01/15/2022 01:28    Assessment & Plan:   Betty Bell was seen today for uri and hypertension.  Diagnoses and all orders for this visit:  Acute non-recurrent frontal sinusitis- She has multiple drug allergies.  Will treat with azithromycin. -     azithromycin (ZITHROMAX) 500 MG tablet; Take 1 tablet (500 mg total) by mouth daily for 3 days.  Essential hypertension- Her blood pressure is adequately well-controlled.   I am having Betty Bell. Betty Cornwall "[name]" start on azithromycin. I am also having her maintain her cetirizine, acetaminophen, clobetasol ointment, famotidine, hydrocortisone, AMBULATORY NON FORMULARY MEDICATION, dicyclomine, benzonatate, and indapamide.  Meds ordered this encounter  Medications   azithromycin (ZITHROMAX) 500 MG tablet    Sig: Take 1 tablet (500 mg total) by  mouth daily for 3 days.    Dispense:  3 tablet    Refill:  0     Follow-up: Return if symptoms worsen or fail to improve.  Scarlette Calico, MD

## 2022-04-20 NOTE — Telephone Encounter (Signed)
Patient called stating she is not feeling well and would like for Dr. Ronnald Ramp nurse to give her a call back whenever they can because she wants to discuss some things with them. Best callback number is [phone].

## 2022-04-28 ENCOUNTER — Telehealth: Payer: Self-pay

## 2022-04-28 NOTE — Telephone Encounter (Signed)
Pt called in and stated she is still having a cough and would like to know if something could be called in.   Please advise.

## 2022-04-29 ENCOUNTER — Other Ambulatory Visit: Payer: Self-pay | Admitting: Internal Medicine

## 2022-04-29 DIAGNOSIS — J0110 Acute frontal sinusitis, unspecified: Secondary | ICD-10-CM

## 2022-04-29 MED ORDER — MOXIFLOXACIN HCL 400 MG PO TABS: 400.0000 mg | ORAL_TABLET | Freq: Every day | ORAL | 0 refills | Status: DC

## 2022-04-30 ENCOUNTER — Other Ambulatory Visit: Payer: Self-pay | Admitting: Internal Medicine

## 2022-04-30 DIAGNOSIS — J0101 Acute recurrent maxillary sinusitis: Secondary | ICD-10-CM

## 2022-04-30 MED ORDER — CLINDAMYCIN HCL 300 MG PO CAPS: 300.0000 mg | ORAL_CAPSULE | Freq: Three times a day (TID) | ORAL | 0 refills | Status: AC

## 2022-04-30 NOTE — Telephone Encounter (Signed)
Patient called and said that she can not take the antibiotic that Dr. Ronnald Ramp prescribed - it upsets her stomach.  Please advise.

## 2022-07-03 ENCOUNTER — Other Ambulatory Visit: Payer: Self-pay | Admitting: Internal Medicine

## 2022-07-03 DIAGNOSIS — I10 Essential (primary) hypertension: Secondary | ICD-10-CM

## 2022-10-02 ENCOUNTER — Telehealth (INDEPENDENT_AMBULATORY_CARE_PROVIDER_SITE_OTHER): Payer: No Typology Code available for payment source | Admitting: Nurse Practitioner

## 2022-10-02 DIAGNOSIS — J0110 Acute frontal sinusitis, unspecified: Secondary | ICD-10-CM

## 2022-10-02 MED ORDER — AZITHROMYCIN 250 MG PO TABS: ORAL_TABLET | ORAL | 0 refills | Status: AC

## 2022-10-02 NOTE — Assessment & Plan Note (Signed)
Acute Discussed that frequently these upper respiratory infections are viral in origin but due to her reported history of reoccurring pneumonia and frequent similar infections I will treat her with Z-Pak. Patient told to follow-up in person if symptoms persist or worsen.  She reports understanding.

## 2022-10-02 NOTE — Progress Notes (Signed)
   Established Patient Office Visit  An audio/visual tele-health visit was completed today for this patient. I connected with  Betty Bell on 10/02/22 utilizing audio/visual technology and verified that I am speaking with the correct person using two identifiers. The patient was located at their home, and I was located at the office of [facility] during the encounter. I discussed the limitations of evaluation and management by telemedicine. The patient expressed understanding and agreed to proceed.     Subjective   Patient ID: Betty Bell, female    DOB: Sep 05, 1960  Age: 62 y.o. MRN: [id number]  Chief Complaint  Patient presents with   URI    Pt stated she experiencing the same symptoms of it based on her previous experience  Started as sore throat then to headache and now cough      Patient has referred visit for the above.  Symptom onset 8 days ago.  Started with sore throat and has now progressed to headache and cough.  Reports she is tested herself for COVID and it was negative.  Takes Zyrtec daily.  Reports history of frequent similar episodes, and feels that she is prone to developing pneumonia if she is not promptly treated.  Reports that she has taken azithromycin in the past and has tolerated this well.    Review of Systems  Constitutional:  Negative for chills and fever.  HENT:  Positive for sore throat. Negative for congestion.   Respiratory:  Positive for cough. Negative for sputum production, shortness of breath and wheezing.   Cardiovascular:  Negative for chest pain.  Neurological:  Positive for headaches.      Objective:     LMP 03/26/2011    Physical Exam Comprehensive physical exam not completed today as office visit was conducted remotely.  No signs of acute respiratory distress.  Patient was alert and oriented, and appeared to have appropriate judgment.   No results found for any visits on 10/02/22.    The  10-year ASCVD risk score (Arnett DK, et al., 2019) is: 9.4%    Assessment & Plan:   Problem List Items Addressed This Visit       Respiratory   Acute non-recurrent frontal sinusitis - Primary    Acute Discussed that frequently these upper respiratory infections are viral in origin but due to her reported history of reoccurring pneumonia and frequent similar infections I will treat her with Z-Pak. Patient told to follow-up in person if symptoms persist or worsen.  She reports understanding.      Relevant Medications   azithromycin (ZITHROMAX) 250 MG tablet    No follow-ups on file.    Elenore Paddy, NP

## 2022-10-10 ENCOUNTER — Other Ambulatory Visit: Payer: Self-pay | Admitting: Gastroenterology

## 2022-10-10 ENCOUNTER — Other Ambulatory Visit: Payer: Self-pay | Admitting: Internal Medicine

## 2022-10-10 DIAGNOSIS — I10 Essential (primary) hypertension: Secondary | ICD-10-CM

## 2022-10-14 ENCOUNTER — Other Ambulatory Visit: Payer: Self-pay | Admitting: Internal Medicine

## 2022-10-14 DIAGNOSIS — I10 Essential (primary) hypertension: Secondary | ICD-10-CM

## 2022-10-14 MED ORDER — INDAPAMIDE 1.25 MG PO TABS: 1.2500 mg | ORAL_TABLET | Freq: Every day | ORAL | 0 refills | Status: DC

## 2022-11-03 ENCOUNTER — Encounter: Payer: Self-pay | Admitting: Internal Medicine

## 2022-11-03 ENCOUNTER — Ambulatory Visit: Payer: No Typology Code available for payment source | Admitting: Internal Medicine

## 2022-11-03 ENCOUNTER — Ambulatory Visit (INDEPENDENT_AMBULATORY_CARE_PROVIDER_SITE_OTHER): Payer: No Typology Code available for payment source | Admitting: Internal Medicine

## 2022-11-03 VITALS — BP 146/82 | HR 82 | Temp 98.0°F | Resp 16 | Ht 63.0 in | Wt 235.0 lb

## 2022-11-03 DIAGNOSIS — K219 Gastro-esophageal reflux disease without esophagitis: Secondary | ICD-10-CM

## 2022-11-03 DIAGNOSIS — I10 Essential (primary) hypertension: Secondary | ICD-10-CM | POA: Diagnosis not present

## 2022-11-03 DIAGNOSIS — R778 Other specified abnormalities of plasma proteins: Secondary | ICD-10-CM

## 2022-11-03 DIAGNOSIS — E559 Vitamin D deficiency, unspecified: Secondary | ICD-10-CM

## 2022-11-03 DIAGNOSIS — E785 Hyperlipidemia, unspecified: Secondary | ICD-10-CM

## 2022-11-03 DIAGNOSIS — Z0000 Encounter for general adult medical examination without abnormal findings: Secondary | ICD-10-CM | POA: Diagnosis not present

## 2022-11-03 DIAGNOSIS — Z0001 Encounter for general adult medical examination with abnormal findings: Secondary | ICD-10-CM | POA: Insufficient documentation

## 2022-11-03 MED ORDER — AMLODIPINE BESYLATE 10 MG PO TABS
10.0000 mg | ORAL_TABLET | Freq: Every day | ORAL | 1 refills | Status: DC
Start: 2022-11-03 — End: 2023-09-06

## 2022-11-03 NOTE — Progress Notes (Signed)
Subjective:  Patient ID: Betty Bell, female    DOB: [birth year]  Age: 62 y.o. MRN: [id number]  CC: Annual Exam, Hypertension, and Hyperlipidemia   HPI Betty Bell presents for a CPX and f/up ----  Discussed the use of AI scribe software for clinical note transcription with the patient, who gave verbal consent to proceed.  History of Present Illness   The patient denies any symptoms suggestive of hypertensive crisis such as headache or blurred vision. They admit to a sedentary lifestyle but deny any exertional chest pain or dyspnea.   The patient also expresses concern about their weight. Despite no change in weight since December, they express frustration at their inability to lose weight. They deny interest in surgical or pharmacological interventions for weight loss, attributing their abdominal bloating to potential gastrointestinal issues rather than weight gain. They express a desire to increase physical activity as a potential solution.       Outpatient Medications Prior to Visit  Medication Sig Dispense Refill   acetaminophen (TYLENOL) 500 MG tablet Take 500 mg by mouth every 6 (six) hours as needed.     AMBULATORY NON FORMULARY MEDICATION Diltiazem gel 2% with lidocaine  Apply a pea sized amount into your rectum three times daily as needed Dispense 30 GM zero refill 30 g 1   cetirizine (ZYRTEC) 10 MG tablet Take 10 mg by mouth daily.     clobetasol ointment (TEMOVATE) 0.05 % Apply 1 application topically 2 (two) times daily. 30 g 0   dicyclomine (BENTYL) 10 MG capsule Take one capsule every 8 hours as needed for abdominal pain 30 capsule 1   famotidine (PEPCID) 20 MG tablet TAKE 1 TABLET(20 MG) BY MOUTH TWICE DAILY 180 tablet 3   hydrocortisone (ANUSOL-HC) 25 MG suppository Place 1 suppository (25 mg total) rectally 2 (two) times daily. 12 suppository 1   benzonatate (TESSALON) 200 MG capsule Take 1 capsule (200 mg total) by mouth 3 (three) times daily as  needed. 60 capsule 0   indapamide (LOZOL) 1.25 MG tablet Take 1 tablet (1.25 mg total) by mouth daily. 90 tablet 0   No facility-administered medications prior to visit.    ROS Review of Systems  Constitutional: Negative.  Negative for appetite change, diaphoresis, fatigue and fever.       ++ night sweats  HENT: Negative.    Eyes: Negative.   Respiratory:  Negative for cough, chest tightness, shortness of breath and wheezing.   Cardiovascular:  Negative for chest pain, palpitations and leg swelling.  Gastrointestinal:  Negative for abdominal pain, constipation, diarrhea, nausea and vomiting.  Endocrine: Negative.   Genitourinary: Negative.  Negative for difficulty urinating.  Musculoskeletal: Negative.  Negative for arthralgias and myalgias.  Skin: Negative.   Neurological:  Negative for dizziness, weakness, light-headedness, numbness and headaches.  Hematological:  Negative for adenopathy. Does not bruise/bleed easily.  Psychiatric/Behavioral: Negative.      Objective:  BP (!) 146/82 (BP Location: Right Arm, Patient Position: Sitting, Cuff Size: Large)   Pulse 82   Temp 98 F (36.7 C) (Oral)   Resp 16   Ht 5\' 3"  (1.6 m)   Wt 235 lb (106.6 kg)   LMP 03/26/2011   SpO2 96%   BMI 41.63 kg/m   BP Readings from Last 3 Encounters:  11/03/22 (!) 146/82  04/20/22 (!) 140/78  02/16/22 132/80    Wt Readings from Last 3 Encounters:  11/03/22 235 lb (106.6 kg)  04/20/22 234 lb (106.1 kg)  02/16/22  233 lb (105.7 kg)    Physical Exam Vitals reviewed.  Constitutional:      Appearance: Normal appearance.  HENT:     Nose: Nose normal.     Mouth/Throat:     Mouth: Mucous membranes are moist.  Eyes:     General: No scleral icterus.    Conjunctiva/sclera: Conjunctivae normal.  Cardiovascular:     Rate and Rhythm: Normal rate. Occasional Extrasystoles are present.    Heart sounds: Normal heart sounds, S1 normal and S2 normal. No murmur heard.    No gallop.     Comments:  EKG- SR with PAC's, 72 bpm No LVH, ST/T wave changes, or Q waves Pulmonary:     Effort: Pulmonary effort is normal.     Breath sounds: No stridor. No wheezing, rhonchi or rales.  Abdominal:     General: Abdomen is flat.     Palpations: There is no mass.     Tenderness: There is no abdominal tenderness. There is no guarding.     Hernia: No hernia is present.  Musculoskeletal:     Cervical back: Neck supple.     Right lower leg: No edema.     Left lower leg: No edema.  Lymphadenopathy:     Cervical: No cervical adenopathy.  Skin:    General: Skin is warm and dry.     Findings: No rash.  Neurological:     General: No focal deficit present.     Mental Status: She is alert. Mental status is at baseline.  Psychiatric:        Mood and Affect: Mood normal.        Behavior: Behavior normal.     Lab Results  Component Value Date   WBC 7.6 11/03/2022   HGB 13.5 11/03/2022   HCT 41.9 11/03/2022   PLT 309.0 11/03/2022   GLUCOSE 84 11/03/2022   CHOL 206 (H) 11/03/2022   TRIG 98.0 11/03/2022   HDL 57.30 11/03/2022   LDLCALC 129 (H) 11/03/2022   ALT 19 11/03/2022   AST 30 11/03/2022   NA 138 11/03/2022   K 3.6 11/03/2022   CL 98 11/03/2022   CREATININE 0.74 11/03/2022   BUN 8 11/03/2022   CO2 26 11/03/2022   TSH 0.86 11/03/2022   HGBA1C 5.7 11/03/2022    DG Abd 2 Views  Result Date: 01/15/2022 CLINICAL DATA:  Constipation. Chronic constipation AND abd pain, hx of GERD. EXAM: ABDOMEN - 2 VIEW COMPARISON:  X-ray abdomen 01/18/2019. FINDINGS: The bowel gas pattern is normal. Stool within the ascending colon and rectum. No increased stool burden noted. There is no evidence of free air. No radio-opaque calculi or other significant radiographic abnormality is seen. IMPRESSION: Nonobstructive bowel gas pattern. Electronically Signed   By: Tish Frederickson M.D.   On: 01/15/2022 01:28    Assessment & Plan:   Essential hypertension- Her blood pressure is not adequately well-controlled.   Will add amlodipine to indapamide. -     Urinalysis, Routine w reflex microscopic; Future -     TSH; Future -     Basic metabolic panel; Future -     CBC with Differential/Platelet; Future -     EKG 12-Lead -     amLODIPine Besylate; Take 1 tablet (10 mg total) by mouth daily.  Dispense: 90 tablet; Refill: 1 -     Indapamide; Take 1 tablet (1.25 mg total) by mouth daily.  Dispense: 90 tablet; Refill: 1  Morbid obesity (HCC)- Labs are negative for secondary  causes.  I encouraged her to improve her lifestyle modifications. -     TSH; Future -     Hepatic function panel; Future -     Hemoglobin A1c; Future  Gastroesophageal reflux disease without esophagitis -     CBC with Differential/Platelet; Future  Vitamin D deficiency disease -     VITAMIN D 25 Hydroxy (Vit-D Deficiency, Fractures); Future -     Cholecalciferol; Take 1 tablet (2,000 Units total) by mouth daily.  Dispense: 90 tablet; Refill: 1  Encounter for general adult medical examination with abnormal findings- Exam completed, labs reviewed, vaccines reviewed and updated, cancer screenings are up-to-date, patient education was given. -     Lipid panel; Future  Dyslipidemia, goal LDL below 100- LP(a) is normal.  I recommended that she take a statin for cardiovascular risk reduction. -     Lipoprotein A (LPA); Future -     Rosuvastatin Calcium; Take 1 tablet (10 mg total) by mouth daily.  Dispense: 90 tablet; Refill: 1  Elevated total protein- Will evaluate for lymphoproliferative disease. -     Protein electrophoresis, serum; Future     Follow-up: Return in about 6 months (around 05/05/2023).  Sanda Linger, MD

## 2022-11-03 NOTE — Patient Instructions (Signed)

## 2022-11-04 DIAGNOSIS — R778 Other specified abnormalities of plasma proteins: Secondary | ICD-10-CM | POA: Insufficient documentation

## 2022-11-04 DIAGNOSIS — E785 Hyperlipidemia, unspecified: Secondary | ICD-10-CM | POA: Insufficient documentation

## 2022-11-04 LAB — CBC WITH DIFFERENTIAL/PLATELET
Basophils Absolute: 0.1 10*3/uL (ref 0.0–0.1)
Basophils Relative: 0.8 % (ref 0.0–3.0)
Eosinophils Absolute: 0.1 10*3/uL (ref 0.0–0.7)
Eosinophils Relative: 1.2 % (ref 0.0–5.0)
HCT: 41.9 % (ref 36.0–46.0)
Hemoglobin: 13.5 g/dL (ref 12.0–15.0)
Lymphocytes Relative: 39.2 % (ref 12.0–46.0)
Lymphs Abs: 3 10*3/uL (ref 0.7–4.0)
MCHC: 32.2 g/dL (ref 30.0–36.0)
MCV: 83 fl (ref 78.0–100.0)
Monocytes Absolute: 0.7 10*3/uL (ref 0.1–1.0)
Monocytes Relative: 9 % (ref 3.0–12.0)
Neutro Abs: 3.8 10*3/uL (ref 1.4–7.7)
Neutrophils Relative %: 49.8 % (ref 43.0–77.0)
Platelets: 309 10*3/uL (ref 150.0–400.0)
RBC: 5.05 Mil/uL (ref 3.87–5.11)
RDW: 14.6 % (ref 11.5–15.5)
WBC: 7.6 10*3/uL (ref 4.0–10.5)

## 2022-11-04 LAB — LIPID PANEL
Cholesterol: 206 mg/dL — ABNORMAL HIGH (ref 0–200)
HDL: 57.3 mg/dL (ref >39.00)
LDL Cholesterol: 129 mg/dL — ABNORMAL HIGH (ref 0–99)
NonHDL: 148.55
Total CHOL/HDL Ratio: 4
Triglycerides: 98 mg/dL (ref 0.0–149.0)
VLDL: 19.6 mg/dL (ref 0.0–40.0)

## 2022-11-04 LAB — URINALYSIS, ROUTINE W REFLEX MICROSCOPIC
Bilirubin Urine: NEGATIVE
Hgb urine dipstick: NEGATIVE
Ketones, ur: NEGATIVE
Leukocytes,Ua: NEGATIVE
Nitrite: NEGATIVE
RBC / HPF: NONE SEEN (ref 0–?)
Specific Gravity, Urine: <=1.005 — AB (ref 1.000–1.030)
Total Protein, Urine: NEGATIVE
Urine Glucose: NEGATIVE
Urobilinogen, UA: 0.2 (ref 0.0–1.0)
WBC, UA: NONE SEEN (ref 0–?)
pH: 6 (ref 5.0–8.0)

## 2022-11-04 LAB — HEPATIC FUNCTION PANEL
ALT: 19 U/L (ref 0–35)
AST: 30 U/L (ref 0–37)
Albumin: 4.8 g/dL (ref 3.5–5.2)
Alkaline Phosphatase: 92 U/L (ref 39–117)
Bilirubin, Direct: 0.1 mg/dL (ref 0.0–0.3)
Total Bilirubin: 0.5 mg/dL (ref 0.2–1.2)
Total Protein: 8.5 g/dL — ABNORMAL HIGH (ref 6.0–8.3)

## 2022-11-04 LAB — BASIC METABOLIC PANEL
BUN: 8 mg/dL (ref 6–23)
CO2: 26 mEq/L (ref 19–32)
Calcium: 10 mg/dL (ref 8.4–10.5)
Chloride: 98 mEq/L (ref 96–112)
Creatinine, Ser: 0.74 mg/dL (ref 0.40–1.20)
GFR: 86.6 mL/min (ref >60.00)
Glucose, Bld: 84 mg/dL (ref 70–99)
Potassium: 3.6 mEq/L (ref 3.5–5.1)
Sodium: 138 mEq/L (ref 135–145)

## 2022-11-04 LAB — TSH: TSH: 0.86 u[IU]/mL (ref 0.35–5.50)

## 2022-11-04 LAB — HEMOGLOBIN A1C: Hgb A1c MFr Bld: 5.7 % (ref 4.6–6.5)

## 2022-11-04 LAB — VITAMIN D 25 HYDROXY (VIT D DEFICIENCY, FRACTURES): VITD: 29.84 ng/mL — ABNORMAL LOW (ref 30.00–100.00)

## 2022-11-04 MED ORDER — CHOLECALCIFEROL 50 MCG (2000 UT) PO TABS
1.0000 | ORAL_TABLET | Freq: Every day | ORAL | 1 refills | Status: AC
Start: 2022-11-04 — End: ?

## 2022-11-04 MED ORDER — ROSUVASTATIN CALCIUM 10 MG PO TABS
10.0000 mg | ORAL_TABLET | Freq: Every day | ORAL | 1 refills | Status: DC
Start: 2022-11-04 — End: 2023-07-06

## 2022-11-04 MED ORDER — INDAPAMIDE 1.25 MG PO TABS
1.2500 mg | ORAL_TABLET | Freq: Every day | ORAL | 1 refills | Status: DC
Start: 2022-11-04 — End: 2022-12-29

## 2022-11-05 ENCOUNTER — Telehealth: Payer: Self-pay | Admitting: Internal Medicine

## 2022-11-05 NOTE — Telephone Encounter (Signed)
Patient called about lab results. She would like a call back at [phone].

## 2022-11-05 NOTE — Telephone Encounter (Signed)
Per result note:   Betty Bell ---   Your total protein is mildly elevated.  This can be a sign of cancer.  Let me know if you experience any worsening night sweats, swollen lymph nodes, rashes, or weight loss.  I would like to recheck the total protein in 2 - 3 months.   Your vitamin D level is low.  Please take 2000 international units of vitamin D once a day with a meal or glass of whole milk.     Your LDL cholesterol is a bit too high.  I have prescribed a medication to lower this and reduce your risk of heart attack and stroke.   The other labs are okay.   Sanda Linger, MD    Pt has been informed and expressed understanding. However, she stated that she would like to have all labs drawn again as she was not fasting and that the lab rep "appeared to be nervous" as he was dropping things during her lab draw and stated the bathroom "nasty". She also would like to know more about the protein lab as she is concerned that PCP has "thrown out the word cancer".   Please advise.

## 2022-11-06 LAB — LIPOPROTEIN A (LPA): Lipoprotein (a): 40 nmol/L (ref <75)

## 2022-11-06 NOTE — Telephone Encounter (Signed)
Fasting will not change the cholesterol level or the recommendation about the cholesterol medication. I have ordered additional lab tests regarding the TP level.  TJ

## 2022-11-06 NOTE — Telephone Encounter (Signed)
Pt has been informed and will come back for additional blood work.

## 2022-11-27 ENCOUNTER — Encounter: Payer: Self-pay | Admitting: Internal Medicine

## 2022-11-27 ENCOUNTER — Ambulatory Visit: Payer: No Typology Code available for payment source | Admitting: Internal Medicine

## 2022-11-27 VITALS — BP 126/80 | HR 90 | Temp 98.0°F | Ht 63.0 in | Wt 229.0 lb

## 2022-11-27 DIAGNOSIS — I10 Essential (primary) hypertension: Secondary | ICD-10-CM

## 2022-11-27 DIAGNOSIS — J069 Acute upper respiratory infection, unspecified: Secondary | ICD-10-CM

## 2022-11-27 DIAGNOSIS — E559 Vitamin D deficiency, unspecified: Secondary | ICD-10-CM

## 2022-11-27 DIAGNOSIS — R6889 Other general symptoms and signs: Secondary | ICD-10-CM

## 2022-11-27 LAB — POC COVID19 BINAXNOW: SARS Coronavirus 2 Ag: NEGATIVE

## 2022-11-27 MED ORDER — HYDROCODONE BIT-HOMATROP MBR 5-1.5 MG/5ML PO SOLN: 5.0000 mL | Freq: Four times a day (QID) | ORAL | 0 refills | Status: AC | PRN

## 2022-11-27 MED ORDER — AZITHROMYCIN 250 MG PO TABS: ORAL_TABLET | ORAL | 1 refills | Status: AC

## 2022-11-27 NOTE — Progress Notes (Unsigned)
Patient ID: Betty Bell, female   DOB: 1961-01-06, 62 y.o.   MRN: [id number]        Chief Complaint: follow up acute URI symptoms, htn, low vit d       HPI:  Betty Bell is a 62 y.o. female  Here with 2-3 days acute onset fever, facial pain, pressure, headache, general weakness and malaise, and greenish d/c, with mild ST and cough, but pt denies chest pain, wheezing, increased sob or doe, orthopnea, PND, increased LE swelling, palpitations, dizziness or syncope.   Pt denies polydipsia, polyuria, or new focal neuro s/s.  No known covid exposure.       Wt Readings from Last 3 Encounters:  11/27/22 229 lb (103.9 kg)  11/03/22 235 lb (106.6 kg)  04/20/22 234 lb (106.1 kg)   BP Readings from Last 3 Encounters:  11/27/22 126/80  11/03/22 (!) 146/82  04/20/22 (!) 140/78         Past Medical History:  Diagnosis Date   Anxiety    Asthma    Cervical dysplasia    High Grade   CIN I (cervical intraepithelial neoplasia I)    Colon polyp 2015   TUBULAR ADENOMA   Endometrial polyp    GERD (gastroesophageal reflux disease)    Hydrosalpinx    Left and Right   Hypertension    IBS (irritable bowel syndrome)    Pneumonia    Rectal burning    Seasonal allergies    Uterine fibroid    Past Surgical History:  Procedure Laterality Date   CARPAL TUNNEL RELEASE Left    x 2   COLONOSCOPY  2015   DILATION AND CURETTAGE OF UTERUS     HYSTEROSCOPY     LASER ABLATION OF THE CERVIX     PELVIC LAPAROSCOPY     DL   UTERINE FIBROID EMBOLIZATION     wrist cystectomy Right     reports that she has never smoked. She has never used smokeless tobacco. She reports that she does not drink alcohol and does not use drugs. family history includes Cancer in her brother; Cancer - Other in her brother; Colon cancer (age of onset: 33) in her brother; Colon cancer (age of onset: 64) in her father; Colon polyps in her sister; Diabetes in her father and mother; Esophageal cancer in her brother;  Hypertension in her father, mother, and sister; Irritable bowel syndrome in her sister; Kidney disease in an other family member; Lung cancer in her brother; Prostate cancer in her father. Allergies  Allergen Reactions   Aspirin     REACTION: Swelling   Cephalosporins     Stomach cramps   Ciprofloxacin     Stomach cramps   Fish Allergy     Shortness of breath   Nitrofurantoin Monohyd Macro     Stomach cramps   Nuzyra [Omadacycline] Nausea And Vomiting and Other (See Comments)    Constipation    Penicillins     Per pt: unknown   Protonix [Pantoprazole Sodium]     Respiratory distress   Sulfa Antibiotics     Per pt: unknown   Sulfur Swelling    SHORTNESS OF BREATH ALSO   Current Outpatient Medications on File Prior to Visit  Medication Sig Dispense Refill   acetaminophen (TYLENOL) 500 MG tablet Take 500 mg by mouth every 6 (six) hours as needed.     AMBULATORY NON FORMULARY MEDICATION Diltiazem gel 2% with lidocaine  Apply a pea sized amount into your  rectum three times daily as needed Dispense 30 GM zero refill 30 g 1   amLODipine (NORVASC) 10 MG tablet Take 1 tablet (10 mg total) by mouth daily. 90 tablet 1   cetirizine (ZYRTEC) 10 MG tablet Take 10 mg by mouth daily.     Cholecalciferol 50 MCG (2000 UT) TABS Take 1 tablet (2,000 Units total) by mouth daily. 90 tablet 1   clobetasol ointment (TEMOVATE) 0.05 % Apply 1 application topically 2 (two) times daily. 30 g 0   dicyclomine (BENTYL) 10 MG capsule Take one capsule every 8 hours as needed for abdominal pain 30 capsule 1   famotidine (PEPCID) 20 MG tablet TAKE 1 TABLET(20 MG) BY MOUTH TWICE DAILY 180 tablet 3   hydrocortisone (ANUSOL-HC) 25 MG suppository Place 1 suppository (25 mg total) rectally 2 (two) times daily. 12 suppository 1   indapamide (LOZOL) 1.25 MG tablet Take 1 tablet (1.25 mg total) by mouth daily. 90 tablet 1   rosuvastatin (CRESTOR) 10 MG tablet Take 1 tablet (10 mg total) by mouth daily. 90 tablet 1    No current facility-administered medications on file prior to visit.        ROS:  All others reviewed and negative.  Objective        PE:  BP 126/80 (BP Location: Right Arm, Patient Position: Sitting, Cuff Size: Normal)   Pulse 90   Temp 98 F (36.7 C) (Oral)   Ht 5\' 3"  (1.6 m)   Wt 229 lb (103.9 kg)   LMP 03/26/2011   SpO2 99%   BMI 40.57 kg/m                 Constitutional: Pt appears in NAD               HENT: Head: NCAT.                Right Ear: External ear normal.                 Left Ear: External ear normal. Bilat tm's with mild erythema.  Max sinus areas mild tender.  Pharynx with mild erythema, no exudate               Eyes: . Pupils are equal, round, and reactive to light. Conjunctivae and EOM are normal               Nose: without d/c or deformity               Neck: Neck supple. Gross normal ROM               Cardiovascular: Normal rate and regular rhythm.                 Pulmonary/Chest: Effort normal and breath sounds without rales or wheezing.                               Neurological: Pt is alert. At baseline orientation, motor grossly intact               Skin: Skin is warm. No rashes, no other new lesions, LE edema - none               Psychiatric: Pt behavior is normal without agitation   Micro: none  Cardiac tracings I have personally interpreted today:  none  Pertinent Radiological findings (summarize): none   Lab Results  Component Value Date  WBC 7.6 11/03/2022   HGB 13.5 11/03/2022   HCT 41.9 11/03/2022   PLT 309.0 11/03/2022   GLUCOSE 84 11/03/2022   CHOL 206 (H) 11/03/2022   TRIG 98.0 11/03/2022   HDL 57.30 11/03/2022   LDLCALC 129 (H) 11/03/2022   ALT 19 11/03/2022   AST 30 11/03/2022   NA 138 11/03/2022   K 3.6 11/03/2022   CL 98 11/03/2022   CREATININE 0.74 11/03/2022   BUN 8 11/03/2022   CO2 26 11/03/2022   TSH 0.86 11/03/2022   HGBA1C 5.7 11/03/2022   SARS Coronavirus 2 Ag Negative Negative   Assessment/Plan:   Betty Bell is a 62 y.o. Black or African American [2] female with  has a past medical history of Anxiety, Asthma, Cervical dysplasia, CIN I (cervical intraepithelial neoplasia I), Colon polyp (2015), Endometrial polyp, GERD (gastroesophageal reflux disease), Hydrosalpinx, Hypertension, IBS (irritable bowel syndrome), Pneumonia, Rectal burning, Seasonal allergies, and Uterine fibroid.  Acute upper respiratory infection Mild to mod, for antibx course zpack and cough med prn,,  to f/u any worsening symptoms or concerns  Vitamin D deficiency disease Last vitamin D Lab Results  Component Value Date   VD25OH 29.84 (L) 11/03/2022   Low, to start oral replacement   Essential hypertension BP Readings from Last 3 Encounters:  11/27/22 126/80  11/03/22 (!) 146/82  04/20/22 (!) 140/78   Stable, pt to continue medical treatment norvasc 10 every day, lozol 1.25 every day  Followup: Return if symptoms worsen or fail to improve.  Oliver Barre, MD 11/29/2022 6:52 AM [facility]

## 2022-11-27 NOTE — Patient Instructions (Signed)
Your COVID test was negative  Please take all new medication as prescribed -the antibiotic, and cough medicine as needed  Please continue all other medications as before, and refills have been done if requested.  Please have the pharmacy call with any other refills you may need.  Please keep your appointments with your specialists as you may have planned

## 2022-11-29 ENCOUNTER — Encounter: Payer: Self-pay | Admitting: Internal Medicine

## 2022-11-29 DIAGNOSIS — J069 Acute upper respiratory infection, unspecified: Secondary | ICD-10-CM | POA: Insufficient documentation

## 2022-11-29 NOTE — Assessment & Plan Note (Signed)
BP Readings from Last 3 Encounters:  11/27/22 126/80  11/03/22 (!) 146/82  04/20/22 (!) 140/78   Stable, pt to continue medical treatment norvasc 10 every day, lozol 1.25 every day

## 2022-11-29 NOTE — Assessment & Plan Note (Signed)
Mild to mod, for antibx course zpack and cough med prn,,  to f/u any worsening symptoms or concerns

## 2022-11-29 NOTE — Assessment & Plan Note (Signed)
Last vitamin D Lab Results  Component Value Date   VD25OH 29.84 (L) 11/03/2022   Low, to start oral replacement

## 2022-12-29 ENCOUNTER — Other Ambulatory Visit: Payer: Self-pay | Admitting: Internal Medicine

## 2022-12-29 ENCOUNTER — Ambulatory Visit: Payer: No Typology Code available for payment source | Admitting: Nurse Practitioner

## 2022-12-29 DIAGNOSIS — I10 Essential (primary) hypertension: Secondary | ICD-10-CM

## 2023-03-19 ENCOUNTER — Encounter: Payer: Self-pay | Admitting: Internal Medicine

## 2023-03-19 ENCOUNTER — Ambulatory Visit (INDEPENDENT_AMBULATORY_CARE_PROVIDER_SITE_OTHER): Payer: No Typology Code available for payment source | Admitting: Internal Medicine

## 2023-03-19 VITALS — BP 132/70 | HR 94 | Temp 98.3°F | Ht 63.0 in | Wt 230.0 lb

## 2023-03-19 DIAGNOSIS — J069 Acute upper respiratory infection, unspecified: Secondary | ICD-10-CM

## 2023-03-19 MED ORDER — AZITHROMYCIN 250 MG PO TABS: ORAL_TABLET | ORAL | 0 refills | Status: DC

## 2023-03-19 NOTE — Progress Notes (Signed)
Subjective:    Patient ID: Betty Bell, female    DOB: 01/06/1961, 62 y.o.   MRN: [id number]      HPI Betty Bell is here for  Chief Complaint  Patient presents with   Cough    Cough and headache since last Thursday. PT notes that headache has subsided but cough still present (very bad at night). Covid- as recently as this Tuesday. Currently treating with robotussin honey    She is here for an acute visit for cold symptoms.   Her symptoms started a little over one week ago  She is experiencing ear pain, PND, sinus pain/pressure, sore throat, cough and lightheadedness.  No fever, sob or wheeze.  Cough is getting worse - has had PNA x 2 and is worried about getting it again.  She has tried taking cough drops, otc cold meds   Covid neg   Medications and allergies reviewed with patient and updated if appropriate.  Current Outpatient Medications on File Prior to Visit  Medication Sig Dispense Refill   acetaminophen (TYLENOL) 500 MG tablet Take 500 mg by mouth every 6 (six) hours as needed.     AMBULATORY NON FORMULARY MEDICATION Diltiazem gel 2% with lidocaine  Apply a pea sized amount into your rectum three times daily as needed Dispense 30 GM zero refill 30 g 1   amLODipine (NORVASC) 10 MG tablet Take 1 tablet (10 mg total) by mouth daily. 90 tablet 1   cetirizine (ZYRTEC) 10 MG tablet Take 10 mg by mouth daily.     Cholecalciferol 50 MCG (2000 UT) TABS Take 1 tablet (2,000 Units total) by mouth daily. 90 tablet 1   clobetasol ointment (TEMOVATE) 0.05 % Apply 1 application topically 2 (two) times daily. 30 g 0   dicyclomine (BENTYL) 10 MG capsule Take one capsule every 8 hours as needed for abdominal pain 30 capsule 1   famotidine (PEPCID) 20 MG tablet TAKE 1 TABLET(20 MG) BY MOUTH TWICE DAILY 180 tablet 3   hydrocortisone (ANUSOL-HC) 25 MG suppository Place 1 suppository (25 mg total) rectally 2 (two) times daily. 12 suppository 1   indapamide (LOZOL) 1.25 MG tablet  TAKE 1 TABLET(1.25 MG) BY MOUTH DAILY 90 tablet 1   rosuvastatin (CRESTOR) 10 MG tablet Take 1 tablet (10 mg total) by mouth daily. 90 tablet 1   No current facility-administered medications on file prior to visit.    Review of Systems  Constitutional:  Negative for chills and fever.  HENT:  Positive for ear pain, postnasal drip, sinus pressure, sinus pain and sore throat. Negative for congestion.   Respiratory:  Positive for cough (mostly dry). Negative for shortness of breath and wheezing.   Neurological:  Positive for light-headedness. Negative for headaches.       Objective:   Vitals:   03/19/23 1548  BP: 132/70  Pulse: 94  Temp: 98.3 F (36.8 C)  SpO2: 98%   BP Readings from Last 3 Encounters:  03/19/23 132/70  11/27/22 126/80  11/03/22 (!) 146/82   Wt Readings from Last 3 Encounters:  03/19/23 230 lb (104.3 kg)  11/27/22 229 lb (103.9 kg)  11/03/22 235 lb (106.6 kg)   Body mass index is 40.74 kg/m.    Physical Exam Constitutional:      General: She is not in acute distress.    Appearance: Normal appearance. She is not ill-appearing.  HENT:     Head: Normocephalic and atraumatic.     Right Ear: Tympanic membrane, ear canal  and external ear normal.     Left Ear: Tympanic membrane, ear canal and external ear normal.     Mouth/Throat:     Mouth: Mucous membranes are moist.     Pharynx: No oropharyngeal exudate or posterior oropharyngeal erythema.  Eyes:     Conjunctiva/sclera: Conjunctivae normal.  Cardiovascular:     Rate and Rhythm: Normal rate and regular rhythm.  Pulmonary:     Effort: Pulmonary effort is normal. No respiratory distress.     Breath sounds: Normal breath sounds. No wheezing or rales.  Musculoskeletal:     Cervical back: Neck supple. No tenderness.  Lymphadenopathy:     Cervical: No cervical adenopathy.  Skin:    General: Skin is warm and dry.  Neurological:     Mental Status: She is alert.            Assessment & Plan:     URI: Acute Concern for developing PNA Start zpak Otc cold meds for symptom relief Rest, fluids call if there is no improvement in symptoms.

## 2023-03-19 NOTE — Patient Instructions (Addendum)
     Medications changes include :   zpak    Return if symptoms worsen or fail to improve.  

## 2023-04-14 ENCOUNTER — Telehealth: Payer: Self-pay

## 2023-04-14 NOTE — Telephone Encounter (Signed)
Pharmacy Patient Advocate Encounter   Received notification from CoverMyMeds that prior authorization for Famotidine 20 mg tablets is required/requested.   Insurance verification completed.   The patient is insured through [facility] .   Per test claim: PA required; PA submitted to above mentioned insurance via CoverMyMeds Key/confirmation #/EOC [id number] Status is pending

## 2023-04-15 ENCOUNTER — Other Ambulatory Visit (HOSPITAL_COMMUNITY): Payer: Self-pay

## 2023-04-15 NOTE — Telephone Encounter (Signed)
Pharmacy Patient Advocate Encounter  Received notification from [facility] that Prior Authorization for Famotidine 20 mg tablets has been APPROVED from 04/15/2023 to 04/14/2024   PA #/Case ID/Reference #: [id number]

## 2023-05-02 ENCOUNTER — Ambulatory Visit
Admission: EM | Admit: 2023-05-02 | Discharge: 2023-05-02 | Disposition: A | Discharge status: Home / Self Care | Payer: No Typology Code available for payment source | Attending: Family Medicine | Admitting: Family Medicine

## 2023-05-02 DIAGNOSIS — J329 Chronic sinusitis, unspecified: Secondary | ICD-10-CM | POA: Diagnosis not present

## 2023-05-02 DIAGNOSIS — J309 Allergic rhinitis, unspecified: Secondary | ICD-10-CM | POA: Diagnosis not present

## 2023-05-02 DIAGNOSIS — J40 Bronchitis, not specified as acute or chronic: Secondary | ICD-10-CM

## 2023-05-02 MED ORDER — PREDNISONE 20 MG PO TABS
ORAL_TABLET | ORAL | 0 refills | Status: DC
Start: 2023-05-02 — End: 2023-09-06

## 2023-05-02 MED ORDER — PROMETHAZINE-DM 6.25-15 MG/5ML PO SYRP: 5.0000 mL | ORAL_SOLUTION | Freq: Three times a day (TID) | ORAL | 0 refills | Status: DC | PRN

## 2023-05-02 NOTE — ED Provider Notes (Signed)
[facility]  Note:  This document was prepared using Conservation officer, historic buildings and may include unintentional dictation errors.  MRN: [id number] DOB: 04/26/1961  Subjective:   Betty Bell is a 62 y.o. female presenting for 6 day history of acute onset dry hacking cough, sinus congestion, sinus drainage, sinus pain. Has a history of asthma, bronchitis. Hasn't need an inhaler in years. No smoking of any kind including cigarettes, cigars, vaping, marijuana use.    No current facility-administered medications for this encounter.  Current Outpatient Medications:    acetaminophen (TYLENOL) 500 MG tablet, Take 500 mg by mouth every 6 (six) hours as needed., Disp: , Rfl:    AMBULATORY NON FORMULARY MEDICATION, Diltiazem gel 2% with lidocaine  Apply a pea sized amount into your rectum three times daily as needed Dispense 30 GM zero refill, Disp: 30 g, Rfl: 1   amLODipine (NORVASC) 10 MG tablet, Take 1 tablet (10 mg total) by mouth daily., Disp: 90 tablet, Rfl: 1   azithromycin (ZITHROMAX) 250 MG tablet, Take two tabs the first day and then one tab daily for four days, Disp: 6 tablet, Rfl: 0   cetirizine (ZYRTEC) 10 MG tablet, Take 10 mg by mouth daily., Disp: , Rfl:    Cholecalciferol 50 MCG (2000 UT) TABS, Take 1 tablet (2,000 Units total) by mouth daily., Disp: 90 tablet, Rfl: 1   clobetasol ointment (TEMOVATE) 0.05 %, Apply 1 application topically 2 (two) times daily., Disp: 30 g, Rfl: 0   dicyclomine (BENTYL) 10 MG capsule, Take one capsule every 8 hours as needed for abdominal pain, Disp: 30 capsule, Rfl: 1   famotidine (PEPCID) 20 MG tablet, TAKE 1 TABLET(20 MG) BY MOUTH TWICE DAILY, Disp: 180 tablet, Rfl: 3   hydrocortisone (ANUSOL-HC) 25 MG suppository, Place 1 suppository (25 mg total) rectally 2 (two) times daily., Disp: 12 suppository, Rfl: 1   indapamide (LOZOL) 1.25 MG tablet, TAKE 1 TABLET(1.25 MG) BY MOUTH DAILY, Disp: 90 tablet, Rfl: 1    rosuvastatin (CRESTOR) 10 MG tablet, Take 1 tablet (10 mg total) by mouth daily., Disp: 90 tablet, Rfl: 1   Allergies  Allergen Reactions   Aspirin     REACTION: Swelling   Cephalosporins     Stomach cramps   Ciprofloxacin     Stomach cramps   Fish Allergy     Shortness of breath   Nitrofurantoin Monohyd Macro     Stomach cramps   Nuzyra [Omadacycline] Nausea And Vomiting and Other (See Comments)    Constipation    Penicillins     Per pt: unknown   Protonix [Pantoprazole Sodium]     Respiratory distress   Sulfa Antibiotics     Per pt: unknown   Sulfur Swelling    SHORTNESS OF BREATH ALSO    Past Medical History:  Diagnosis Date   Anxiety    Asthma    Cervical dysplasia    High Grade   CIN I (cervical intraepithelial neoplasia I)    Colon polyp 2015   TUBULAR ADENOMA   Endometrial polyp    GERD (gastroesophageal reflux disease)    Hydrosalpinx    Left and Right   Hypertension    IBS (irritable bowel syndrome)    Pneumonia    Rectal burning    Seasonal allergies    Uterine fibroid      Past Surgical History:  Procedure Laterality Date   CARPAL TUNNEL RELEASE Left    x 2   COLONOSCOPY  2015   DILATION AND CURETTAGE OF UTERUS     HYSTEROSCOPY     LASER ABLATION OF THE CERVIX     PELVIC LAPAROSCOPY     DL   UTERINE FIBROID EMBOLIZATION     wrist cystectomy Right     Family History  Problem Relation Age of Onset   Diabetes Mother    Hypertension Mother    Diabetes Father    Hypertension Father    Prostate cancer Father    Colon cancer Father 17   Colon cancer Brother 4   Cancer Brother        esophagus, skin   Esophageal cancer Brother    Colon polyps Sister    Hypertension Sister    Lung cancer Brother    Cancer - Other Brother        esophagal   Kidney disease Other    Irritable bowel syndrome Sister    COPD Neg Hx    Alcohol abuse Neg Hx    Depression Neg Hx    Drug abuse Neg Hx    Early death Neg Hx    Hearing loss Neg Hx    Heart  disease Neg Hx    Hyperlipidemia Neg Hx    Stroke Neg Hx    Stomach cancer Neg Hx    Rectal cancer Neg Hx     Social History   Tobacco Use   Smoking status: Never   Smokeless tobacco: Never   Tobacco comments:    Regular Exercise - Yes  Vaping Use   Vaping status: Never Used  Substance Use Topics   Alcohol use: No    Alcohol/week: 0.0 standard drinks of alcohol   Drug use: No    ROS   Objective:   Vitals: BP 139/80 (BP Location: Left Arm)   Pulse 75   Temp 97.6 F (36.4 C) (Oral)   Resp 20   LMP 03/26/2011   SpO2 96%   Physical Exam Constitutional:      General: She is not in acute distress.    Appearance: Normal appearance. She is well-developed and normal weight. She is not ill-appearing, toxic-appearing or diaphoretic.  HENT:     Head: Normocephalic and atraumatic.     Right Ear: Tympanic membrane, ear canal and external ear normal. No drainage or tenderness. No middle ear effusion. There is no impacted cerumen. Tympanic membrane is not erythematous or bulging.     Left Ear: Tympanic membrane, ear canal and external ear normal. No drainage or tenderness.  No middle ear effusion. There is no impacted cerumen. Tympanic membrane is not erythematous or bulging.     Nose: Congestion present. No rhinorrhea.     Mouth/Throat:     Mouth: Mucous membranes are moist. No oral lesions.     Pharynx: No pharyngeal swelling, oropharyngeal exudate, posterior oropharyngeal erythema or uvula swelling.     Tonsils: No tonsillar exudate or tonsillar abscesses.  Eyes:     General: No scleral icterus.       Right eye: No discharge.        Left eye: No discharge.     Extraocular Movements: Extraocular movements intact.     Right eye: Normal extraocular motion.     Left eye: Normal extraocular motion.     Conjunctiva/sclera: Conjunctivae normal.  Cardiovascular:     Rate and Rhythm: Normal rate and regular rhythm.     Heart sounds: Normal heart sounds. No murmur heard.    No  friction rub.  No gallop.  Pulmonary:     Effort: Pulmonary effort is normal. No respiratory distress.     Breath sounds: No stridor. Rhonchi (mild over mid lung fields bilaterally) present. No wheezing or rales.  Chest:     Chest wall: No tenderness.  Musculoskeletal:     Cervical back: Normal range of motion and neck supple.  Lymphadenopathy:     Cervical: No cervical adenopathy.  Skin:    General: Skin is warm and dry.  Neurological:     General: No focal deficit present.     Mental Status: She is alert and oriented to person, place, and time.  Psychiatric:        Mood and Affect: Mood normal.        Behavior: Behavior normal.     Assessment and Plan :   PDMP not reviewed this encounter.  1. Sinobronchitis   2. Allergic rhinitis, unspecified seasonality, unspecified trigger    Will defer antibiotic use for now especially in light of her significant medication and sensitivities and allergies.  Recommend chest x-ray if there is no improvement.  Will manage for sinobronchitis with a prednisone course.  Use supportive care as well.  Counseled patient on potential for adverse effects with medications prescribed/recommended today, ER and return-to-clinic precautions discussed, patient verbalized understanding.    Wallis Bamberg, [place] 05/02/23 [id number]

## 2023-05-02 NOTE — ED Triage Notes (Signed)
Pt c/o sinus pain/pressure with post nasal drip that is making her cough-sx started 6 days ago-denies fever-NAD-steady gait

## 2023-07-03 ENCOUNTER — Other Ambulatory Visit: Payer: Self-pay | Admitting: Internal Medicine

## 2023-07-03 DIAGNOSIS — E785 Hyperlipidemia, unspecified: Secondary | ICD-10-CM

## 2023-07-06 ENCOUNTER — Other Ambulatory Visit: Payer: Self-pay | Admitting: Internal Medicine

## 2023-07-06 ENCOUNTER — Inpatient Hospital Stay
Admission: RE | Admit: 2023-07-06 | Discharge: 2023-07-06 | Disposition: A | Discharge status: Home / Self Care | Payer: No Typology Code available for payment source | Source: Ambulatory Visit | Attending: Internal Medicine | Admitting: Internal Medicine

## 2023-07-06 DIAGNOSIS — Z1231 Encounter for screening mammogram for malignant neoplasm of breast: Secondary | ICD-10-CM

## 2023-07-06 DIAGNOSIS — E785 Hyperlipidemia, unspecified: Secondary | ICD-10-CM

## 2023-07-14 ENCOUNTER — Ambulatory Visit: Payer: No Typology Code available for payment source

## 2023-08-04 ENCOUNTER — Other Ambulatory Visit: Payer: Self-pay | Admitting: Internal Medicine

## 2023-08-04 DIAGNOSIS — E785 Hyperlipidemia, unspecified: Secondary | ICD-10-CM

## 2023-08-13 ENCOUNTER — Telehealth: Payer: Self-pay | Admitting: Internal Medicine

## 2023-08-31 ENCOUNTER — Other Ambulatory Visit: Payer: Self-pay | Admitting: Internal Medicine

## 2023-08-31 DIAGNOSIS — E785 Hyperlipidemia, unspecified: Secondary | ICD-10-CM

## 2023-09-06 ENCOUNTER — Ambulatory Visit (INDEPENDENT_AMBULATORY_CARE_PROVIDER_SITE_OTHER): Admitting: Internal Medicine

## 2023-09-06 ENCOUNTER — Encounter: Payer: Self-pay | Admitting: Internal Medicine

## 2023-09-06 VITALS — BP 148/78 | HR 80 | Temp 98.8°F | Resp 16 | Ht 63.0 in | Wt 229.2 lb

## 2023-09-06 DIAGNOSIS — Z6841 Body Mass Index (BMI) 40.0 and over, adult: Secondary | ICD-10-CM | POA: Diagnosis not present

## 2023-09-06 DIAGNOSIS — I10 Essential (primary) hypertension: Secondary | ICD-10-CM | POA: Diagnosis not present

## 2023-09-06 DIAGNOSIS — E559 Vitamin D deficiency, unspecified: Secondary | ICD-10-CM | POA: Diagnosis not present

## 2023-09-06 DIAGNOSIS — Z860101 Personal history of adenomatous and serrated colon polyps: Secondary | ICD-10-CM

## 2023-09-06 DIAGNOSIS — R778 Other specified abnormalities of plasma proteins: Secondary | ICD-10-CM

## 2023-09-06 LAB — BASIC METABOLIC PANEL WITH GFR
BUN: 11 mg/dL (ref 6–23)
CO2: 26 meq/L (ref 19–32)
Calcium: 9.5 mg/dL (ref 8.4–10.5)
Chloride: 99 meq/L (ref 96–112)
Creatinine, Ser: 0.7 mg/dL (ref 0.40–1.20)
GFR: 92.03 mL/min (ref >60.00)
Glucose, Bld: 100 mg/dL — ABNORMAL HIGH (ref 70–99)
Potassium: 3.5 meq/L (ref 3.5–5.1)
Sodium: 138 meq/L (ref 135–145)

## 2023-09-06 LAB — CBC WITH DIFFERENTIAL/PLATELET
Basophils Absolute: 0.1 10*3/uL (ref 0.0–0.1)
Basophils Relative: 0.9 % (ref 0.0–3.0)
Eosinophils Absolute: 0.1 10*3/uL (ref 0.0–0.7)
Eosinophils Relative: 1.5 % (ref 0.0–5.0)
HCT: 42.8 % (ref 36.0–46.0)
Hemoglobin: 14.5 g/dL (ref 12.0–15.0)
Lymphocytes Relative: 38.6 % (ref 12.0–46.0)
Lymphs Abs: 3.3 10*3/uL (ref 0.7–4.0)
MCHC: 33.9 g/dL (ref 30.0–36.0)
MCV: 83.8 fl (ref 78.0–100.0)
Monocytes Absolute: 0.8 10*3/uL (ref 0.1–1.0)
Monocytes Relative: 9.6 % (ref 3.0–12.0)
Neutro Abs: 4.3 10*3/uL (ref 1.4–7.7)
Neutrophils Relative %: 49.4 % (ref 43.0–77.0)
Platelets: 319 10*3/uL (ref 150.0–400.0)
RBC: 5.11 Mil/uL (ref 3.87–5.11)
RDW: 14.4 % (ref 11.5–15.5)
WBC: 8.7 10*3/uL (ref 4.0–10.5)

## 2023-09-06 LAB — HEPATIC FUNCTION PANEL
ALT: 21 U/L (ref 0–35)
AST: 26 U/L (ref 0–37)
Albumin: 4.6 g/dL (ref 3.5–5.2)
Alkaline Phosphatase: 87 U/L (ref 39–117)
Bilirubin, Direct: 0.1 mg/dL (ref 0.0–0.3)
Total Bilirubin: 0.5 mg/dL (ref 0.2–1.2)
Total Protein: 7.9 g/dL (ref 6.0–8.3)

## 2023-09-06 LAB — HEMOGLOBIN A1C: Hgb A1c MFr Bld: 5.9 % (ref 4.6–6.5)

## 2023-09-06 MED ORDER — AMLODIPINE BESYLATE 10 MG PO TABS: 10.0000 mg | ORAL_TABLET | Freq: Every day | ORAL | 0 refills | Status: DC

## 2023-09-06 NOTE — Patient Instructions (Signed)
 Hypertension, Adult High blood pressure (hypertension) is when the force of blood pumping through the arteries is too strong. The arteries are the blood vessels that carry blood from the heart throughout the body. Hypertension forces the heart to work harder to pump blood and may cause arteries to become narrow or stiff. Untreated or uncontrolled hypertension can lead to a heart attack, heart failure, a stroke, kidney disease, and other problems. A blood pressure reading consists of a higher number over a lower number. Ideally, your blood pressure should be below 120/80. The first ("top") number is called the systolic pressure. It is a measure of the pressure in your arteries as your heart beats. The second ("bottom") number is called the diastolic pressure. It is a measure of the pressure in your arteries as the heart relaxes. What are the causes? The exact cause of this condition is not known. There are some conditions that result in high blood pressure. What increases the risk? Certain factors may make you more likely to develop high blood pressure. Some of these risk factors are under your control, including: Smoking. Not getting enough exercise or physical activity. Being overweight. Having too much fat, sugar, calories, or salt (sodium) in your diet. Drinking too much alcohol. Other risk factors include: Having a personal history of heart disease, diabetes, high cholesterol, or kidney disease. Stress. Having a family history of high blood pressure and high cholesterol. Having obstructive sleep apnea. Age. The risk increases with age. What are the signs or symptoms? High blood pressure may not cause symptoms. Very high blood pressure (hypertensive crisis) may cause: Headache. Fast or irregular heartbeats (palpitations). Shortness of breath. Nosebleed. Nausea and vomiting. Vision changes. Severe chest pain, dizziness, and seizures. How is this diagnosed? This condition is diagnosed by  measuring your blood pressure while you are seated, with your arm resting on a flat surface, your legs uncrossed, and your feet flat on the floor. The cuff of the blood pressure monitor will be placed directly against the skin of your upper arm at the level of your heart. Blood pressure should be measured at least twice using the same arm. Certain conditions can cause a difference in blood pressure between your right and left arms. If you have a high blood pressure reading during one visit or you have normal blood pressure with other risk factors, you may be asked to: Return on a different day to have your blood pressure checked again. Monitor your blood pressure at home for 1 week or longer. If you are diagnosed with hypertension, you may have other blood or imaging tests to help your health care provider understand your overall risk for other conditions. How is this treated? This condition is treated by making healthy lifestyle changes, such as eating healthy foods, exercising more, and reducing your alcohol intake. You may be referred for counseling on a healthy diet and physical activity. Your health care provider may prescribe medicine if lifestyle changes are not enough to get your blood pressure under control and if: Your systolic blood pressure is above 130. Your diastolic blood pressure is above 80. Your personal target blood pressure may vary depending on your medical conditions, your age, and other factors. Follow these instructions at home: Eating and drinking  Eat a diet that is high in fiber and potassium, and low in sodium, added sugar, and fat. An example of this eating plan is called the DASH diet. DASH stands for Dietary Approaches to Stop Hypertension. To eat this way: Eat  plenty of fresh fruits and vegetables. Try to fill one half of your plate at each meal with fruits and vegetables. Eat whole grains, such as whole-wheat pasta, brown rice, or whole-grain bread. Fill about one  fourth of your plate with whole grains. Eat or drink low-fat dairy products, such as skim milk or low-fat yogurt. Avoid fatty cuts of meat, processed or cured meats, and poultry with skin. Fill about one fourth of your plate with lean proteins, such as fish, chicken without skin, beans, eggs, or tofu. Avoid pre-made and processed foods. These tend to be higher in sodium, added sugar, and fat. Reduce your daily sodium intake. Many people with hypertension should eat less than 1,500 mg of sodium a day. Do not drink alcohol if: Your health care provider tells you not to drink. You are pregnant, may be pregnant, or are planning to become pregnant. If you drink alcohol: Limit how much you have to: 0-1 drink a day for women. 0-2 drinks a day for men. Know how much alcohol is in your drink. In the [place] one drink equals one 12 oz bottle of beer (355 mL), one 5 oz glass of wine (148 mL), or one 1 oz glass of hard liquor (44 mL). Lifestyle  Work with your health care provider to maintain a healthy body weight or to lose weight. Ask what an ideal weight is for you. Get at least 30 minutes of exercise that causes your heart to beat faster (aerobic exercise) most days of the week. Activities may include walking, swimming, or biking. Include exercise to strengthen your muscles (resistance exercise), such as Pilates or lifting weights, as part of your weekly exercise routine. Try to do these types of exercises for 30 minutes at least 3 days a week. Do not use any products that contain nicotine or tobacco. These products include cigarettes, chewing tobacco, and vaping devices, such as e-cigarettes. If you need help quitting, ask your health care provider. Monitor your blood pressure at home as told by your health care provider. Keep all follow-up visits. This is important. Medicines Take over-the-counter and prescription medicines only as told by your health care provider. Follow directions carefully. Blood  pressure medicines must be taken as prescribed. Do not skip doses of blood pressure medicine. Doing this puts you at risk for problems and can make the medicine less effective. Ask your health care provider about side effects or reactions to medicines that you should watch for. Contact a health care provider if you: Think you are having a reaction to a medicine you are taking. Have headaches that keep coming back (recurring). Feel dizzy. Have swelling in your ankles. Have trouble with your vision. Get help right away if you: Develop a severe headache or confusion. Have unusual weakness or numbness. Feel faint. Have severe pain in your chest or abdomen. Vomit repeatedly. Have trouble breathing. These symptoms may be an emergency. Get help right away. Call 911. Do not wait to see if the symptoms will go away. Do not drive yourself to the hospital. Summary Hypertension is when the force of blood pumping through your arteries is too strong. If this condition is not controlled, it may put you at risk for serious complications. Your personal target blood pressure may vary depending on your medical conditions, your age, and other factors. For most people, a normal blood pressure is less than 120/80. Hypertension is treated with lifestyle changes, medicines, or a combination of both. Lifestyle changes include losing weight, eating a healthy,  low-sodium diet, exercising more, and limiting alcohol. This information is not intended to replace advice given to you by your health care provider. Make sure you discuss any questions you have with your health care provider. Document Revised: 03/04/2021 Document Reviewed: 03/04/2021 Elsevier Patient Education  2024 [employer].

## 2023-09-06 NOTE — Progress Notes (Signed)
 Subjective:  Patient ID: Betty Bell, female    DOB: 09-16-1960  Age: 63 y.o. MRN: [id number]  CC: Hypertension   HPI [name] presents for f/up ------  Discussed the use of AI scribe software for clinical note transcription with the patient, who gave verbal consent to proceed.  History of Present Illness   Betty Bell "[name]" is a 63 year old female with hypertension who presents for blood pressure management.  No headache or blurred vision, although she mentions allergies. She was unaware of her elevated blood pressure until today.  She is currently taking indapamide  for her blood pressure but is not taking amlodipine , as she believes she is only on one blood pressure medication. She also takes rosuvastatin  for cholesterol management.   She experiences difficulty with weight loss despite eating less and drinking more water. She does not engage in regular exercise but walks a little, which causes some fatigue. No chest pain or shortness of breath during physical activity.  She has a history of elevated protein levels and is due for a colonoscopy. She also mentions needing to schedule a pap smear, which was last done over a year ago at [facility].       Outpatient Medications Prior to Visit  Medication Sig Dispense Refill   acetaminophen  (TYLENOL ) 500 MG tablet Take 500 mg by mouth every 6 (six) hours as needed.     AMBULATORY NON FORMULARY MEDICATION Diltiazem gel 2% with lidocaine  Apply a pea sized amount into your rectum three times daily as needed Dispense 30 GM zero refill 30 g 1   Cholecalciferol  50 MCG (2000 UT) TABS Take 1 tablet (2,000 Units total) by mouth daily. 90 tablet 1   clobetasol  ointment (TEMOVATE ) 0.05 % Apply 1 application topically 2 (two) times daily. 30 g 0   dicyclomine  (BENTYL ) 10 MG capsule Take one capsule every 8 hours as needed for abdominal pain 30 capsule 1   famotidine  (PEPCID ) 20 MG tablet TAKE 1  TABLET(20 MG) BY MOUTH TWICE DAILY 180 tablet 3   hydrocortisone  (ANUSOL -HC) 25 MG suppository Place 1 suppository (25 mg total) rectally 2 (two) times daily. 12 suppository 1   indapamide  (LOZOL ) 1.25 MG tablet TAKE 1 TABLET(1.25 MG) BY MOUTH DAILY 90 tablet 1   rosuvastatin  (CRESTOR ) 10 MG tablet TAKE 1 TABLET(10 MG) BY MOUTH DAILY 30 tablet 0   amLODipine  (NORVASC ) 10 MG tablet Take 1 tablet (10 mg total) by mouth daily. 90 tablet 1   azithromycin  (ZITHROMAX ) 250 MG tablet Take two tabs the first day and then one tab daily for four days 6 tablet 0   predniSONE  (DELTASONE ) 20 MG tablet Take 2 tablets daily with breakfast. 10 tablet 0   promethazine -dextromethorphan (PROMETHAZINE -DM) 6.25-15 MG/5ML syrup Take 5 mLs by mouth 3 (three) times daily as needed for cough. 200 mL 0   No facility-administered medications prior to visit.    ROS Review of Systems  Constitutional:  Negative for appetite change, chills, diaphoresis, fatigue and unexpected weight change.  HENT: Negative.  Negative for sore throat.   Eyes: Negative.   Respiratory:  Negative for chest tightness, shortness of breath and wheezing.   Cardiovascular:  Negative for chest pain, palpitations and leg swelling.  Gastrointestinal:  Negative for abdominal pain, constipation, diarrhea, nausea and vomiting.  Endocrine: Negative.   Genitourinary: Negative.  Negative for difficulty urinating.  Musculoskeletal: Negative.  Negative for arthralgias and myalgias.  Skin: Negative.   Neurological:  Negative for dizziness.  Hematological:  Negative for adenopathy. Does not bruise/bleed easily.  Psychiatric/Behavioral: Negative.      Objective:  BP (!) 148/78 (BP Location: Left Arm, Patient Position: Sitting, Cuff Size: Large)   Pulse 80   Temp 98.8 F (37.1 C) (Temporal)   Resp 16   Ht 5\' 3"  (1.6 m)   Wt 229 lb 3.2 oz (104 kg)   LMP 03/26/2011   SpO2 97%   BMI 40.60 kg/m   BP Readings from Last 3 Encounters:  09/06/23 (!)  148/78  05/02/23 139/80  03/19/23 132/70    Wt Readings from Last 3 Encounters:  09/06/23 229 lb 3.2 oz (104 kg)  03/19/23 230 lb (104.3 kg)  11/27/22 229 lb (103.9 kg)    Physical Exam Vitals reviewed.  Constitutional:      Appearance: Normal appearance.  HENT:     Mouth/Throat:     Mouth: Mucous membranes are moist.  Eyes:     General: No scleral icterus.    Conjunctiva/sclera: Conjunctivae normal.  Cardiovascular:     Rate and Rhythm: Normal rate and regular rhythm. Occasional Extrasystoles are present.    Heart sounds: No murmur heard.    No friction rub. No gallop.     Comments: EKG- SR with PAC's, 76 bpm No LVH, Q waves, or ST/T wave changes  Pulmonary:     Effort: Pulmonary effort is normal.     Breath sounds: No stridor. No wheezing, rhonchi or rales.  Abdominal:     General: Abdomen is protuberant. Bowel sounds are normal. There is no distension.     Palpations: There is no hepatomegaly, splenomegaly or mass.     Tenderness: There is no abdominal tenderness. There is no guarding.     Hernia: No hernia is present.  Musculoskeletal:        General: Normal range of motion.     Cervical back: Neck supple.     Right lower leg: No edema.     Left lower leg: No edema.  Skin:    General: Skin is warm and dry.  Neurological:     General: No focal deficit present.     Mental Status: She is alert. Mental status is at baseline.  Psychiatric:        Mood and Affect: Mood normal.        Behavior: Behavior normal.     Lab Results  Component Value Date   WBC 8.7 09/06/2023   HGB 14.5 09/06/2023   HCT 42.8 09/06/2023   PLT 319.0 09/06/2023   GLUCOSE 100 (H) 09/06/2023   CHOL 206 (H) 11/03/2022   TRIG 98.0 11/03/2022   HDL 57.30 11/03/2022   LDLCALC 129 (H) 11/03/2022   ALT 21 09/06/2023   AST 26 09/06/2023   NA 138 09/06/2023   K 3.5 09/06/2023   CL 99 09/06/2023   CREATININE 0.70 09/06/2023   BUN 11 09/06/2023   CO2 26 09/06/2023   TSH 0.86 11/03/2022    HGBA1C 5.9 09/06/2023    MM 3D SCREENING MAMMOGRAM BILATERAL BREAST Result Date: 07/09/2023 CLINICAL DATA:  Screening. EXAM: DIGITAL SCREENING BILATERAL MAMMOGRAM WITH TOMOSYNTHESIS AND CAD TECHNIQUE: Bilateral screening digital craniocaudal and mediolateral oblique mammograms were obtained. Bilateral screening digital breast tomosynthesis was performed. The images were evaluated with computer-aided detection. COMPARISON:  Previous exam(s). ACR Breast Density Category a: The breasts are almost entirely fatty. FINDINGS: There are no findings suspicious for malignancy. IMPRESSION: No mammographic evidence of malignancy. A result letter of this screening mammogram will  be mailed directly to the patient. RECOMMENDATION: Screening mammogram in one year. (Code:SM-B-01Y) BI-RADS CATEGORY  1: Negative. Electronically Signed   By: Alinda Apley M.D.   On: 07/09/2023 07:11    Assessment & Plan:   Essential hypertension- She has not achieved her BP goal. Will restart the CCB. -     EKG 12-Lead -     Basic metabolic panel with GFR; Future -     CBC with Differential/Platelet; Future -     amLODIPine  Besylate; Take 1 tablet (10 mg total) by mouth daily.  Dispense: 90 tablet; Refill: 0  Elevated total protein -     CBC with Differential/Platelet; Future -     Protein electrophoresis, serum; Future  Morbid obesity (HCC) -     Hemoglobin A1c; Future -     Hepatic function panel; Future  Hx of adenomatous polyp of colon -     Ambulatory referral to Gastroenterology  Vitamin D  deficiency disease -     VITAMIN D  25 Hydroxy (Vit-D Deficiency, Fractures); Future  Abnormal SPEP- Referred to hematology.     Follow-up: Return in about 3 months (around 12/06/2023).  Sandra Crouch, MD

## 2023-09-07 ENCOUNTER — Encounter: Payer: Self-pay | Admitting: Internal Medicine

## 2023-09-07 LAB — VITAMIN D 25 HYDROXY (VIT D DEFICIENCY, FRACTURES): VITD: 30.94 ng/mL (ref 30.00–100.00)

## 2023-09-08 LAB — PROTEIN ELECTROPHORESIS, SERUM
Abnormal Protein Band1: 0.6 g/dL — ABNORMAL HIGH
Albumin ELP: 4.1 g/dL (ref 3.8–4.8)
Alpha 1: 0.3 g/dL (ref 0.2–0.3)
Alpha 2: 1 g/dL — ABNORMAL HIGH (ref 0.5–0.9)
Beta 2: 0.8 g/dL — ABNORMAL HIGH (ref 0.2–0.5)
Beta Globulin: 0.4 g/dL (ref 0.4–0.6)
Gamma Globulin: 0.8 g/dL (ref 0.8–1.7)
Total Protein: 7.5 g/dL (ref 6.1–8.1)

## 2023-09-09 ENCOUNTER — Other Ambulatory Visit: Payer: Self-pay | Admitting: Internal Medicine

## 2023-09-09 DIAGNOSIS — R778 Other specified abnormalities of plasma proteins: Secondary | ICD-10-CM | POA: Insufficient documentation

## 2023-09-09 NOTE — Progress Notes (Signed)
 Referral received. Discussed with Betty Bell and can be seen in 2-4 weeks, sent to scheduler.

## 2023-09-18 ENCOUNTER — Ambulatory Visit
Admission: EM | Admit: 2023-09-18 | Discharge: 2023-09-18 | Disposition: A | Discharge status: Home / Self Care | Attending: Urgent Care | Admitting: Urgent Care

## 2023-09-18 DIAGNOSIS — H6121 Impacted cerumen, right ear: Secondary | ICD-10-CM | POA: Diagnosis not present

## 2023-09-18 DIAGNOSIS — J309 Allergic rhinitis, unspecified: Secondary | ICD-10-CM | POA: Diagnosis not present

## 2023-09-18 DIAGNOSIS — J040 Acute laryngitis: Secondary | ICD-10-CM

## 2023-09-18 DIAGNOSIS — J018 Other acute sinusitis: Secondary | ICD-10-CM | POA: Diagnosis not present

## 2023-09-18 MED ORDER — PREDNISONE 20 MG PO TABS: ORAL_TABLET | ORAL | 0 refills | Status: DC

## 2023-09-18 NOTE — ED Triage Notes (Signed)
 Pt c/o cough x 1-1.5 weeks-denies fever-NAD-steady giat

## 2023-09-18 NOTE — ED Provider Notes (Signed)
 [facility]  Note:  This document was prepared using Conservation officer, historic buildings and may include unintentional dictation errors.  MRN: [id number] DOB: [birth year]  Subjective:   Betty Bell is a 63 y.o. female presenting for 1.5 week history of sinus congestion, productive cough, post-nasal drainage, throat pain, right ear pain, malaise and fatigue. No chest pain, shob, wheezing. Has a history of asthma but was a long time ago, has never used an inhaler. Has allergies, takes Zyrtec daily. No smoking of any kind including cigarettes, cigars, vaping, marijuana use.  No diabetes.   No current facility-administered medications for this encounter.  Current Outpatient Medications:    acetaminophen  (TYLENOL ) 500 MG tablet, Take 500 mg by mouth every 6 (six) hours as needed., Disp: , Rfl:    AMBULATORY NON FORMULARY MEDICATION, Diltiazem gel 2% with lidocaine  Apply a pea sized amount into your rectum three times daily as needed Dispense 30 GM zero refill, Disp: 30 g, Rfl: 1   amLODipine  (NORVASC ) 10 MG tablet, Take 1 tablet (10 mg total) by mouth daily., Disp: 90 tablet, Rfl: 0   Cholecalciferol  50 MCG (2000 UT) TABS, Take 1 tablet (2,000 Units total) by mouth daily., Disp: 90 tablet, Rfl: 1   clobetasol  ointment (TEMOVATE ) 0.05 %, Apply 1 application topically 2 (two) times daily., Disp: 30 g, Rfl: 0   dicyclomine  (BENTYL ) 10 MG capsule, Take one capsule every 8 hours as needed for abdominal pain, Disp: 30 capsule, Rfl: 1   famotidine  (PEPCID ) 20 MG tablet, TAKE 1 TABLET(20 MG) BY MOUTH TWICE DAILY, Disp: 180 tablet, Rfl: 3   hydrocortisone  (ANUSOL -HC) 25 MG suppository, Place 1 suppository (25 mg total) rectally 2 (two) times daily., Disp: 12 suppository, Rfl: 1   indapamide  (LOZOL ) 1.25 MG tablet, TAKE 1 TABLET(1.25 MG) BY MOUTH DAILY, Disp: 90 tablet, Rfl: 1   rosuvastatin  (CRESTOR ) 10 MG tablet, TAKE 1 TABLET(10 MG) BY MOUTH DAILY, Disp: 30 tablet, Rfl: 0    Allergies  Allergen Reactions   Aspirin     REACTION: Swelling   Cephalosporins     Stomach cramps   Ciprofloxacin     Stomach cramps   Fish Allergy     Shortness of breath   Nitrofurantoin Monohyd Macro     Stomach cramps   Nsaids Other (See Comments)   Nuzyra  [Omadacycline ] Nausea And Vomiting and Other (See Comments)    Constipation    Penicillins     Per pt: unknown   Protonix  [Pantoprazole  Sodium]     Respiratory distress   Sulfa Antibiotics     Per pt: unknown   Sulfur Swelling    SHORTNESS OF BREATH ALSO    Past Medical History:  Diagnosis Date   Anxiety    Asthma    Cervical dysplasia    High Grade   CIN I (cervical intraepithelial neoplasia I)    Colon polyp 2015   TUBULAR ADENOMA   Endometrial polyp    GERD (gastroesophageal reflux disease)    Hydrosalpinx    Left and Right   Hypertension    IBS (irritable bowel syndrome)    Pneumonia    Rectal burning    Seasonal allergies    Uterine fibroid      Past Surgical History:  Procedure Laterality Date   CARPAL TUNNEL RELEASE Left    x 2   COLONOSCOPY  2015   DILATION AND CURETTAGE OF UTERUS     HYSTEROSCOPY     LASER ABLATION  OF THE CERVIX     PELVIC LAPAROSCOPY     DL   UTERINE FIBROID EMBOLIZATION     wrist cystectomy Right     Family History  Problem Relation Age of Onset   Diabetes Mother    Hypertension Mother    Diabetes Father    Hypertension Father    Prostate cancer Father    Colon cancer Father 48   Colon cancer Brother 76   Cancer Brother        esophagus, skin   Esophageal cancer Brother    Colon polyps Sister    Hypertension Sister    Lung cancer Brother    Cancer - Other Brother        esophagal   Kidney disease Other    Irritable bowel syndrome Sister    COPD Neg Hx    Alcohol abuse Neg Hx    Depression Neg Hx    Drug abuse Neg Hx    Early death Neg Hx    Hearing loss Neg Hx    Heart disease Neg Hx    Hyperlipidemia Neg Hx    Stroke Neg Hx    Stomach  cancer Neg Hx    Rectal cancer Neg Hx     Social History   Tobacco Use   Smoking status: Never   Smokeless tobacco: Never   Tobacco comments:    Regular Exercise - Yes  Vaping Use   Vaping status: Never Used  Substance Use Topics   Alcohol use: No    Alcohol/week: 0.0 standard drinks of alcohol   Drug use: No    ROS   Objective:   Vitals: BP (!) (P) 146/90 (BP Location: Right Arm)   Resp 20   LMP 03/26/2011   SpO2 94%   Physical Exam Constitutional:      General: She is not in acute distress.    Appearance: Normal appearance. She is well-developed and normal weight. She is not ill-appearing, toxic-appearing or diaphoretic.  HENT:     Head: Normocephalic and atraumatic.     Right Ear: Tympanic membrane, ear canal and external ear normal. No drainage or tenderness. No middle ear effusion. There is no impacted cerumen. Tympanic membrane is not erythematous or bulging.     Left Ear: Tympanic membrane, ear canal and external ear normal. No drainage or tenderness.  No middle ear effusion. There is no impacted cerumen. Tympanic membrane is not erythematous or bulging.     Nose: Congestion and rhinorrhea present.     Comments: Thick productive mucus.  Edematous nasal mucosa.    Mouth/Throat:     Mouth: Mucous membranes are moist. No oral lesions.     Pharynx: Posterior oropharyngeal erythema present. No pharyngeal swelling, oropharyngeal exudate or uvula swelling.     Tonsils: No tonsillar exudate or tonsillar abscesses. 0 on the right. 0 on the left.     Comments: Hoarseness of voice. Eyes:     General: No scleral icterus.       Right eye: No discharge.        Left eye: No discharge.     Extraocular Movements: Extraocular movements intact.     Right eye: Normal extraocular motion.     Left eye: Normal extraocular motion.     Conjunctiva/sclera: Conjunctivae normal.  Cardiovascular:     Rate and Rhythm: Normal rate and regular rhythm.     Heart sounds: Normal heart  sounds. No murmur heard.    No friction rub. No gallop.  Pulmonary:     Effort: Pulmonary effort is normal. No respiratory distress.     Breath sounds: No stridor. No wheezing, rhonchi or rales.  Chest:     Chest wall: No tenderness.  Musculoskeletal:     Cervical back: Normal range of motion and neck supple.  Lymphadenopathy:     Cervical: No cervical adenopathy.  Skin:    General: Skin is warm and dry.  Neurological:     General: No focal deficit present.     Mental Status: She is alert and oriented to person, place, and time.  Psychiatric:        Mood and Affect: Mood normal.        Behavior: Behavior normal.    Ear lavage performed using mixture of peroxide and water.  Pressure irrigation performed using a bottle and a thin ear tube.  Right ear lavage.  No curette was used.   Assessment and Plan :   PDMP not reviewed this encounter.  1. Allergic rhinitis, unspecified seasonality, unspecified trigger   2. Acute non-recurrent sinusitis of other sinus   3. Laryngitis   4. Impacted cerumen of right ear    Patient has significant reactions with antibiotics and prefers to avoid them.  As such, recommended prednisone  to address an allergic rhinitis.  Advised that we may end up needing to use antibiotics if she has no improvement in the next 3 days.  Maintain Zyrtec.  Deferred imaging given clear cardiopulmonary exam, hemodynamically stable vital signs.  Successful right ear lavage.  General management of cerumen impaction reviewed with patient.  Anticipatory guidance provided. Counseled patient on potential for adverse effects with medications prescribed/recommended today, ER and return-to-clinic precautions discussed, patient verbalized understanding.    Adolph Hoop, PA-C 09/18/23 1314

## 2023-09-18 NOTE — Discharge Instructions (Signed)
 We will manage this as a sinus infection and sinus allergy flare with prednisone  since you do not want to use an antibiotic at this stage. Keep taking Zyrtec daily. For sore throat or cough try using a honey-based tea. Use 3 teaspoons of honey with juice squeezed from half lemon. Place shaved pieces of ginger into 1/2-1 cup of water and warm over stove top. Then mix the ingredients and repeat every 4 hours as needed. Please take Tylenol  500mg -650mg  once every 6 hours for fevers, aches and pains. Hydrate very well with at least 2 liters (64 ounces) of water. Eat light meals such as soups (chicken and noodles, chicken wild rice, vegetable).  Do not eat any foods that you are allergic to.

## 2023-10-11 ENCOUNTER — Telehealth: Payer: Self-pay | Admitting: Internal Medicine

## 2023-10-11 ENCOUNTER — Telehealth: Payer: Self-pay | Admitting: Nurse Practitioner

## 2023-10-11 NOTE — Progress Notes (Unsigned)
 [facility]  [phone]   HEMATOLOGY ONCOLOGY  CONSULTATION   Betty Bell  DOB: July 21, 1960  MR#: [id number]  CSN#: [id number]     Patient Care Team: Arcadio Knuckles, MD as PCP - General Pasqual Bone Alvis Ba, RN as Oncology Nurse Navigator  Reason for consult: abnormal SPEP  History of present illness:   patient had labs done through primary care 09/06/2023. She had abnormal SPEP at that time. Had abnormal SPEP in the past. Her renal functions have been normal. Her calcium  levels have historically been normal. She is not anemic. . On 09/06/2023, BUN was 11, creatinine 0.70, and eGFR 92.03.  Today, she denies unusual symptoms or concerns. She does have some fatigue, but she is at her baseline. She denies new fevers, chills, or unintentional weight loss. She denies chest pain, chest pressure, or shortness of breath. She denies headaches or visual disturbances. She denies abdominal pain, nausea, vomiting, or changes in bowel or bladder habits.   Socially, the patient is divorced. She lives with her sister. She has an adult son and a grandson. She works full time for [employer]. She does work from from home and states that she does a great deal of sitting. She does not smoke. She does not use alcohol. She does not use illegal or illicit drugs.   MEDICAL HISTORY:  Past Medical History:  Diagnosis Date   Anxiety    Asthma    Cervical dysplasia    High Grade   CIN I (cervical intraepithelial neoplasia I)    Colon polyp 2015   TUBULAR ADENOMA   Endometrial polyp    GERD (gastroesophageal reflux disease)    Hydrosalpinx    Left and Right   Hypertension    IBS (irritable bowel syndrome)    Pneumonia    Rectal burning    Seasonal allergies    Uterine fibroid     SURGICAL HISTORY: Past Surgical History:  Procedure Laterality Date   CARPAL TUNNEL RELEASE Left    x 2   COLONOSCOPY  2015   DILATION AND CURETTAGE OF UTERUS     HYSTEROSCOPY      LASER ABLATION OF THE CERVIX     PELVIC LAPAROSCOPY     DL   UTERINE FIBROID EMBOLIZATION     wrist cystectomy Right     SOCIAL HISTORY: Social History   Socioeconomic History   Marital status: Divorced    Spouse name: Not on file   Number of children: 1   Years of education: Not on file   Highest education level: Not on file  Occupational History   Occupation: Astronomer: Advertising copywriter  Tobacco Use   Smoking status: Never   Smokeless tobacco: Never   Tobacco comments:    Regular Exercise - Yes  Vaping Use   Vaping status: Never Used  Substance and Sexual Activity   Alcohol use: No    Alcohol/week: 0.0 standard drinks of alcohol   Drug use: No   Sexual activity: Yes    Birth control/protection: Post-menopausal    Comment: 1st intercourse- 16, partners - 3  Other Topics Concern   Not on file  Social History Narrative   Not on file   Social Drivers of Health   Financial Resource Strain: Not on file  Food Insecurity: No Food Insecurity (10/12/2023)   Hunger Vital Sign    Worried About Running Out of Food in the Last Year: Never true  Ran Out of Food in the Last Year: Never true  Transportation Needs: No Transportation Needs (10/12/2023)   PRAPARE - Administrator, Civil Service (Medical): No    Lack of Transportation (Non-Medical): No  Physical Activity: Not on file  Stress: Not on file  Social Connections: Not on file  Intimate Partner Violence: Not At Risk (10/12/2023)   Humiliation, Afraid, Rape, and Kick questionnaire    Fear of Current or Ex-Partner: No    Emotionally Abused: No    Physically Abused: No    Sexually Abused: No    FAMILY HISTORY: Family History  Problem Relation Age of Onset   Diabetes Mother    Hypertension Mother    Diabetes Father    Hypertension Father    Prostate cancer Father    Colon cancer Father 52   Colon cancer Brother 7   Cancer Brother        esophagus, skin   Esophageal cancer Brother     Colon polyps Sister    Hypertension Sister    Lung cancer Brother    Cancer - Other Brother        esophagal   Kidney disease Other    Irritable bowel syndrome Sister    COPD Neg Hx    Alcohol abuse Neg Hx    Depression Neg Hx    Drug abuse Neg Hx    Early death Neg Hx    Hearing loss Neg Hx    Heart disease Neg Hx    Hyperlipidemia Neg Hx    Stroke Neg Hx    Stomach cancer Neg Hx    Rectal cancer Neg Hx     ALLERGIES:  is allergic to aspirin, cephalosporins, ciprofloxacin, fish allergy, nitrofurantoin monohyd macro, nsaids, nuzyra  [omadacycline ], penicillins, protonix  [pantoprazole  sodium], sulfa antibiotics, and sulfur.  MEDICATIONS:  Current Outpatient Medications  Medication Sig Dispense Refill   acetaminophen  (TYLENOL ) 500 MG tablet Take 500 mg by mouth every 6 (six) hours as needed.     amLODipine  (NORVASC ) 10 MG tablet Take 1 tablet (10 mg total) by mouth daily. 90 tablet 0   Cholecalciferol  50 MCG (2000 UT) TABS Take 1 tablet (2,000 Units total) by mouth daily. 90 tablet 1   famotidine  (PEPCID ) 20 MG tablet TAKE 1 TABLET(20 MG) BY MOUTH TWICE DAILY 180 tablet 3   indapamide  (LOZOL ) 1.25 MG tablet TAKE 1 TABLET(1.25 MG) BY MOUTH DAILY 90 tablet 1   rosuvastatin  (CRESTOR ) 10 MG tablet TAKE 1 TABLET(10 MG) BY MOUTH DAILY 30 tablet 0   No current facility-administered medications for this visit.    REVIEW OF SYSTEMS:   Constitutional: Denies fevers, chills or abnormal night sweats Eyes: Denies blurriness of vision, double vision or watery eyes Ears, nose, mouth, throat, and face: Denies mucositis or sore throat Respiratory: Denies cough, dyspnea or wheezes Cardiovascular: Denies palpitation, chest discomfort or lower extremity swelling Gastrointestinal:  Denies nausea, heartburn or change in bowel habits Skin: Denies abnormal skin rashes Lymphatics: Denies new lymphadenopathy or easy bruising Neurological:Denies numbness, tingling or new  weaknesses Behavioral/Psych: Mood is stable, no new changes  All other systems were reviewed with the patient and are negative.  PHYSICAL EXAMINATION: ECOG PERFORMANCE STATUS: 0 - Asymptomatic  Vitals:   10/12/23 0836 10/12/23 0847  BP: (!) 160/100 138/88  Pulse: 75   Resp: 17   Temp: 97.6 F (36.4 C)   SpO2: 99%    Filed Weights   10/12/23 0836  Weight: 232 lb 6.4 oz (105.4  kg)    GENERAL:alert, no distress and comfortable SKIN: skin color, texture, turgor are normal, no rashes or significant lesions EYES: normal, conjunctiva are pink and non-injected, sclera clear OROPHARYNX:no exudate, no erythema and lips, buccal mucosa, and tongue normal  NECK: supple, thyroid  normal size, non-tender, without nodularity LYMPH:  no palpable lymphadenopathy in the cervical, axillary or inguinal LUNGS: clear to auscultation and percussion with normal breathing effort HEART: regular rate & rhythm and no murmurs and no lower extremity edema ABDOMEN:abdomen soft, non-tender and normal bowel sounds Musculoskeletal:no cyanosis of digits and no clubbing  PSYCH: alert & oriented x 3 with fluent speech NEURO: no focal motor/sensory deficits  LABORATORY DATA:  I have reviewed the data as listed Lab Results  Component Value Date   WBC 8.7 09/06/2023   HGB 14.5 09/06/2023   HCT 42.8 09/06/2023   MCV 83.8 09/06/2023   PLT 319.0 09/06/2023   Recent Labs    11/03/22 1634 09/06/23 1610  NA 138 138  K 3.6 3.5  CL 98 99  CO2 26 26  GLUCOSE 84 100*  BUN 8 11  CREATININE 0.74 0.70  CALCIUM  10.0 9.5  PROT 8.5* 7.5  7.9  ALBUMIN 4.8 4.6  AST 30 26  ALT 19 21  ALKPHOS 92 87  BILITOT 0.5 0.5  BILIDIR 0.1 0.1    RADIOGRAPHIC STUDIES: I have personally reviewed the radiological images as listed and agreed with the findings in the report. No results found.  ASSESSMENT & PLAN:  ***  Recommendations:   All questions were answered. The patient knows to call the clinic with any  problems, questions or concerns.      Sharyon Deis, NP 10/12/2023 9:40 AM

## 2023-10-11 NOTE — Telephone Encounter (Signed)
 Unable to reach patient. LMTRC

## 2023-10-11 NOTE — Telephone Encounter (Signed)
 Copied from CRM [id number]. Topic: Referral - Question >> Oct 11, 2023  9:54 AM Baldo Levan wrote: Reason for CRM: Patient is calling in regards to hematology doctor referral. The hematology office called and the patient was asking why she had this appointment, and they stated due to your diagnosis, patient stated she has not received a diagnosis and would like to know what her diagnosis is.

## 2023-10-11 NOTE — Telephone Encounter (Signed)
 Copied from CRM [id number]. Topic: General - Other >> Oct 11, 2023  2:30 PM Martinique E wrote: Reason for CRM: Patient called returning a missed call from [facility] in regards to a referral. Callback number for patient is [phone].

## 2023-10-12 ENCOUNTER — Inpatient Hospital Stay: Admitting: Physician Assistant

## 2023-10-12 ENCOUNTER — Other Ambulatory Visit

## 2023-10-12 ENCOUNTER — Encounter: Admitting: Nurse Practitioner

## 2023-10-12 ENCOUNTER — Inpatient Hospital Stay: Attending: Nurse Practitioner | Admitting: Nurse Practitioner

## 2023-10-12 ENCOUNTER — Inpatient Hospital Stay

## 2023-10-12 VITALS — BP 138/88 | HR 75 | Temp 97.6°F | Resp 17 | Wt 232.4 lb

## 2023-10-12 DIAGNOSIS — D472 Monoclonal gammopathy: Secondary | ICD-10-CM | POA: Diagnosis present

## 2023-10-12 DIAGNOSIS — R778 Other specified abnormalities of plasma proteins: Secondary | ICD-10-CM | POA: Diagnosis not present

## 2023-10-12 DIAGNOSIS — Z79899 Other long term (current) drug therapy: Secondary | ICD-10-CM | POA: Diagnosis not present

## 2023-10-13 LAB — KAPPA/LAMBDA LIGHT CHAINS
Kappa free light chain: 15.4 mg/L (ref 3.3–19.4)
Kappa, lambda light chain ratio: 0.14 — ABNORMAL LOW (ref 0.26–1.65)
Lambda free light chains: 113.2 mg/L — ABNORMAL HIGH (ref 5.7–26.3)

## 2023-10-13 NOTE — Telephone Encounter (Signed)
 Patient has already seen Hematology. They explained everything and she has a very good understanding.

## 2023-10-14 LAB — MULTIPLE MYELOMA PANEL, SERUM
Albumin SerPl Elph-Mcnc: 3.4 g/dL (ref 2.9–4.4)
Albumin/Glob SerPl: 1.1 (ref 0.7–1.7)
Alpha 1: 0.3 g/dL (ref 0.0–0.4)
Alpha2 Glob SerPl Elph-Mcnc: 1 g/dL (ref 0.4–1.0)
B-Globulin SerPl Elph-Mcnc: 1.4 g/dL — ABNORMAL HIGH (ref 0.7–1.3)
Gamma Glob SerPl Elph-Mcnc: 0.7 g/dL (ref 0.4–1.8)
Globulin, Total: 3.4 g/dL (ref 2.2–3.9)
IgA: 585 mg/dL — ABNORMAL HIGH (ref 87–352)
IgG (Immunoglobin G), Serum: 744 mg/dL (ref 586–1602)
IgM (Immunoglobulin M), Srm: 109 mg/dL (ref 26–217)
M Protein SerPl Elph-Mcnc: 0.5 g/dL — ABNORMAL HIGH
Total Protein ELP: 6.8 g/dL (ref 6.0–8.5)

## 2023-10-15 ENCOUNTER — Other Ambulatory Visit: Payer: Self-pay | Admitting: Internal Medicine

## 2023-10-15 DIAGNOSIS — I10 Essential (primary) hypertension: Secondary | ICD-10-CM

## 2023-10-15 NOTE — Assessment & Plan Note (Addendum)
 labs done through primary care 09/06/2023. She had abnormal SPEP at that time. Had abnormal SPEP in the past. Her renal functions have been normal. Her calcium  levels have historically been normal. She is not anemic. . On 09/06/2023, BUN was 11, creatinine 0.70, and eGFR 92.03.  Discussed etiology of abnormal SPEP and slight elevation of M protein spike in blood.  Most likely this is MGUS.  Will draw labs to evaluate serum light chains, multiple myeloma panel, CBC, and CMP.  Will also get bone survey to evaluate for abnormal bone lesions.  If negative, and blood work showing MGUS, we will continue to monitor every 6 months.  Explained there is a small possibility if MGUS, may develop into multiple myeloma which would require treatment.  She voiced understanding.  Will follow-up with patient in approximately 3 weeks to discuss results of labs and bone survey.  Will plan for evaluation and/or treatment as indicated.

## 2023-10-18 DIAGNOSIS — D472 Monoclonal gammopathy: Secondary | ICD-10-CM | POA: Diagnosis not present

## 2023-10-20 ENCOUNTER — Ambulatory Visit (HOSPITAL_COMMUNITY)
Admission: RE | Admit: 2023-10-20 | Discharge: 2023-10-20 | Disposition: A | Discharge status: Home / Self Care | Source: Ambulatory Visit | Attending: Nurse Practitioner | Admitting: Nurse Practitioner

## 2023-10-20 DIAGNOSIS — R778 Other specified abnormalities of plasma proteins: Secondary | ICD-10-CM | POA: Insufficient documentation

## 2023-10-21 LAB — UPEP/TP, 24-HR URINE
Albumin, U: 45.9 %
Alpha 1, Urine: 6.9 %
Alpha 2, Urine: 13.1 %
Beta, Urine: 15.2 %
Gamma Globulin, Urine: 18.8 %
M-Spike, mg/24 hr: 9.1 mg/(24.h) — ABNORMAL HIGH
M-spike, %: 6.9 % — ABNORMAL HIGH
Total Protein, Urine-Ur/day: 132 mg/(24.h) (ref 30–150)
Total Protein, Urine: 6.3 mg/dL
Total Volume: 2100

## 2023-10-25 ENCOUNTER — Encounter: Payer: Self-pay | Admitting: Internal Medicine

## 2023-11-03 ENCOUNTER — Other Ambulatory Visit: Payer: Self-pay

## 2023-11-03 ENCOUNTER — Telehealth: Payer: Self-pay

## 2023-11-03 ENCOUNTER — Inpatient Hospital Stay (HOSPITAL_BASED_OUTPATIENT_CLINIC_OR_DEPARTMENT_OTHER): Admitting: Hematology

## 2023-11-03 DIAGNOSIS — D472 Monoclonal gammopathy: Secondary | ICD-10-CM | POA: Insufficient documentation

## 2023-11-03 NOTE — Telephone Encounter (Signed)
 LVM for pt to give Dr. Demetra office a call regarding BM BX.  Requested a return call from pt regarding if pt is willing to go to [facility] for BM BX.  Awaiting return call.

## 2023-11-03 NOTE — Progress Notes (Signed)
 [facility]   Telephone:(336) [phone] Fax:(336) [phone]   Clinic Follow up Note   Patient Care Team: Joshua Debby CROME, MD as PCP - General Elana Montie CROME, RN as Oncology Nurse Navigator 11/03/2023  I connected with [name] on 11/03/23 at  3:40 PM EDT by telephone and verified that I am speaking with the correct person using two identifiers.   I discussed the limitations, risks, security and privacy concerns of performing an evaluation and management service by telephone and the availability of in person appointments. I also discussed with the patient that there may be a patient responsible charge related to this service. The patient expressed understanding and agreed to proceed.   Patient's location: Home Provider's location:  Office    CHIEF COMPLAINT: Review lab results  Assessment & Plan Monoclonal gammopathy of undetermined significance (MGUS) MGUS with IgA type and elevated lambda light chain, approximately five times higher than normal. M protein level is 0.5, which is not very high. No organ damage observed. Urine test showed positive protein related to MGUS, but monoclonal protein level in urine is not high. Bone survey shows no lytic lesions. Blood counts and liver function are normal, indicating no signs of multiple myeloma. Bone marrow biopsy is recommended to assess risk of progression to multiple myeloma. The procedure involves CT guidance and sedation, with risks including bleeding, infection, and pain, though it is generally well tolerated with mild soreness post-procedure. The biopsy will help determine the percentage of plasma cells in the bone marrow to assess the risk of progression to multiple myeloma. - Order CT-guided bone marrow biopsy by interventional radiology. - Monitor M protein and light chain levels every 3 to 6 months. - Schedule follow-up visit after bone marrow biopsy to discuss results and further management.  Plan - I  reviewed her recent lab results and the bone survey results - I recommend bone marrow biopsy in the next months, will refer her to IR - Follow-up 1 to 2 weeks after bone marrow biopsy   Discussed the use of AI scribe software for clinical note transcription with the patient, who gave verbal consent to proceed.  History of Present Illness Betty Bell is a 63 year old female with MGUS who presents for a phone visit to review her lab results.  She has IgA type monoclonal gammopathy of undetermined significance (MGUS). Recent laboratory results show an M protein level of 0.5 and a lambda light chain level approximately five times higher than normal. Urine analysis indicates proteinuria associated with MGUS. She experiences chronic leg pain occasionally when standing. No new bone pain.     REVIEW OF SYSTEMS:   Constitutional: Denies fevers, chills or abnormal weight loss Eyes: Denies blurriness of vision Ears, nose, mouth, throat, and face: Denies mucositis or sore throat Respiratory: Denies cough, dyspnea or wheezes Cardiovascular: Denies palpitation, chest discomfort or lower extremity swelling Gastrointestinal:  Denies nausea, heartburn or change in bowel habits Skin: Denies abnormal skin rashes Lymphatics: Denies new lymphadenopathy or easy bruising Neurological:Denies numbness, tingling or new weaknesses Behavioral/Psych: Mood is stable, no new changes  All other systems were reviewed with the patient and are negative.  MEDICAL HISTORY:  Past Medical History:  Diagnosis Date   Anxiety    Asthma    Cervical dysplasia    High Grade   CIN I (cervical intraepithelial neoplasia I)    Colon polyp 2015   TUBULAR ADENOMA   Endometrial polyp    GERD (gastroesophageal reflux  disease)    Hydrosalpinx    Left and Right   Hypertension    IBS (irritable bowel syndrome)    Pneumonia    Rectal burning    Seasonal allergies    Uterine fibroid     SURGICAL  HISTORY: Past Surgical History:  Procedure Laterality Date   CARPAL TUNNEL RELEASE Left    x 2   COLONOSCOPY  2015   DILATION AND CURETTAGE OF UTERUS     HYSTEROSCOPY     LASER ABLATION OF THE CERVIX     PELVIC LAPAROSCOPY     DL   UTERINE FIBROID EMBOLIZATION     wrist cystectomy Right     I have reviewed the social history and family history with the patient and they are unchanged from previous note.  ALLERGIES:  is allergic to aspirin, cephalosporins, ciprofloxacin, fish allergy, nitrofurantoin monohyd macro, nsaids, nuzyra  [omadacycline ], penicillins, protonix  [pantoprazole  sodium], sulfa antibiotics, and sulfur.  MEDICATIONS:  Current Outpatient Medications  Medication Sig Dispense Refill   acetaminophen  (TYLENOL ) 500 MG tablet Take 500 mg by mouth every 6 (six) hours as needed.     amLODipine  (NORVASC ) 10 MG tablet Take 1 tablet (10 mg total) by mouth daily. 90 tablet 0   Cholecalciferol  50 MCG (2000 UT) TABS Take 1 tablet (2,000 Units total) by mouth daily. 90 tablet 1   famotidine  (PEPCID ) 20 MG tablet TAKE 1 TABLET(20 MG) BY MOUTH TWICE DAILY 180 tablet 3   indapamide  (LOZOL ) 1.25 MG tablet TAKE 1 TABLET(1.25 MG) BY MOUTH DAILY 90 tablet 1   rosuvastatin  (CRESTOR ) 10 MG tablet TAKE 1 TABLET(10 MG) BY MOUTH DAILY 30 tablet 0   No current facility-administered medications for this visit.    PHYSICAL EXAMINATION: Not performed   LABORATORY DATA:  I have reviewed the data as listed    Latest Ref Rng & Units 09/06/2023    4:10 PM 11/03/2022    4:34 PM 02/12/2022    9:58 AM  CBC  WBC 4.0 - 10.5 K/uL 8.7  7.6  6.8   Hemoglobin 12.0 - 15.0 g/dL 85.4  86.4  87.5   Hematocrit 36.0 - 46.0 % 42.8  41.9  37.5   Platelets 150.0 - 400.0 K/uL 319.0  309.0  344.0         Latest Ref Rng & Units 09/06/2023    4:10 PM 11/03/2022    4:34 PM 02/12/2022    9:58 AM  CMP  Glucose 70 - 99 mg/dL 899  84  891   BUN 6 - 23 mg/dL 11  8  9    Creatinine 0.40 - 1.20 mg/dL 9.29  9.25  9.29    Sodium 135 - 145 mEq/L 138  138  139   Potassium 3.5 - 5.1 mEq/L 3.5  3.6  3.8   Chloride 96 - 112 mEq/L 99  98  103   CO2 19 - 32 mEq/L 26  26  29    Calcium  8.4 - 10.5 mg/dL 9.5  89.9  9.3   Total Protein 6.1 - 8.1 g/dL 6.0 - 8.3 g/dL 7.5    7.9  8.5  7.2   Total Bilirubin 0.2 - 1.2 mg/dL 0.5  0.5  0.6   Alkaline Phos 39 - 117 U/L 87  92  74   AST 0 - 37 U/L 26  30  15    ALT 0 - 35 U/L 21  19  12        RADIOGRAPHIC STUDIES: I have personally reviewed the  radiological images as listed and agreed with the findings in the report. No results found.     I discussed the assessment and treatment plan with the patient. The patient was provided an opportunity to ask questions and all were answered. The patient agreed with the plan and demonstrated an understanding of the instructions.   The patient was advised to call back or seek an in-person evaluation if the symptoms worsen or if the condition fails to improve as anticipated.  I provided 25 minutes of non face-to-face telephone visit time during this encounter, including review of chart and various tests results, discussions about plan of care and coordination of care plan.    Onita Mattock, MD 11/03/23

## 2023-11-03 NOTE — Telephone Encounter (Signed)
 done

## 2023-11-03 NOTE — Telephone Encounter (Signed)
 Pt returned call regarding BM Bx.  Pt stated she is NOT able to go to [facility] for BM Bx d/t pt's sister will have to drive the pt.  Pt stated she would rather have the appt in [id number].  Notified Melissa X w/Central Scheduling to make her aware of the pt's response.  The earliest available appt is 11/29/2023.  Melissa, will contact pt to schedule the procedure.  Pt verbalized understanding and had no further questions or concerns.

## 2023-11-07 ENCOUNTER — Other Ambulatory Visit: Payer: Self-pay | Admitting: Internal Medicine

## 2023-11-07 DIAGNOSIS — E785 Hyperlipidemia, unspecified: Secondary | ICD-10-CM

## 2023-11-09 ENCOUNTER — Other Ambulatory Visit: Payer: Self-pay | Admitting: Internal Medicine

## 2023-11-09 DIAGNOSIS — I10 Essential (primary) hypertension: Secondary | ICD-10-CM

## 2023-11-10 ENCOUNTER — Telehealth: Payer: Self-pay | Admitting: Hematology

## 2023-11-10 NOTE — Telephone Encounter (Signed)
 Called to get the patient scheduled per staff message. Patient states she does not want to schedule any follow up appointments until she knows if her scan will be covered by insurance. A message was sent to Dr.Feng and her nurse Anders RN.

## 2023-11-11 ENCOUNTER — Other Ambulatory Visit: Payer: Self-pay

## 2023-11-11 ENCOUNTER — Telehealth: Payer: Self-pay

## 2023-11-11 NOTE — Telephone Encounter (Signed)
 Spoke with pt via telephone to discuss if pt's insurance has approved the BM Bx that Dr. Lanny ordered.  Informed pt that the BM Bx has been approved by the pt's insurance.  Pt then wanted to know why the BM Bx was ordered.  Stated d/t pt's multiple myeloma and light chain lab results were out of normal range Dr. Lanny ordered the [facility] Bx to help better determine what is going on with the pt.  The pt verbalized understanding and had no further questions or concerns at this time.  Pt stated she will schedule the 2 wk f/u post BM BX and asked to be transferred to Dr. Demetra scheduler to schedule this appt.  Transferred pt to Lesley Gander in [facility] Scheduling.

## 2023-11-15 ENCOUNTER — Other Ambulatory Visit: Payer: Self-pay | Admitting: Internal Medicine

## 2023-11-15 ENCOUNTER — Telehealth: Payer: Self-pay | Admitting: Hematology

## 2023-11-15 DIAGNOSIS — I10 Essential (primary) hypertension: Secondary | ICD-10-CM

## 2023-11-15 NOTE — Telephone Encounter (Unsigned)
 Copied from CRM [id number]. Topic: Clinical - Medication Refill >> Nov 15, 2023  3:12 PM Suzen RAMAN wrote: Medication: amLODipine  (NORVASC ) 10 MG tablet   Has the patient contacted their pharmacy? Yes  This is the patient's preferred pharmacy:  [id number] [address], [place] - [address] AT [facility] [address] [place] [address] Phone: [phone] Fax: [phone]  Is this the correct pharmacy for this prescription? Yes If no, delete pharmacy and type the correct one.   Has the prescription been filled recently? No  Is the patient out of the medication? Yes  Has the patient been seen for an appointment in the last year OR does the patient have an upcoming appointment? Yes  Can we respond through MyChart? Yes  Agent: Please be advised that Rx refills may take up to 3 business days. We ask that you follow-up with your pharmacy.   ----------------------------------------------------------------------- From previous Reason for Contact - Medication Question: Reason for CRM:

## 2023-11-15 NOTE — Telephone Encounter (Signed)
 Scheduled appointment per staff message. Talked with the patient and she is aware of the made appointment.

## 2023-11-17 MED ORDER — AMLODIPINE BESYLATE 10 MG PO TABS: 10.0000 mg | ORAL_TABLET | Freq: Every day | ORAL | 0 refills | Status: DC

## 2023-11-28 NOTE — H&P (Signed)
 Chief Complaint: Monoclonal gammopathy of undetermined significance; referred for image guided bone marrow biopsy to rule out myeloma  Referring Provider(s): Feng,Y  Supervising Physician: Philip Cornet  Patient Status: [facility] - Out-pt  History of Present Illness: Betty Bell is a 63 y.o. female with PMH sig for anxiety, asthma, cervical dysplasia, GERD, HTN, IBS, uterine fibroid who presents now with MGUS. She is scheduled today for image guided bone marrow biopsy to rule out myeloma.   *** Patient is Full Code  Past Medical History:  Diagnosis Date   Anxiety    Asthma    Cervical dysplasia    High Grade   CIN I (cervical intraepithelial neoplasia I)    Colon polyp 2015   TUBULAR ADENOMA   Endometrial polyp    GERD (gastroesophageal reflux disease)    Hydrosalpinx    Left and Right   Hypertension    IBS (irritable bowel syndrome)    Pneumonia    Rectal burning    Seasonal allergies    Uterine fibroid     Past Surgical History:  Procedure Laterality Date   CARPAL TUNNEL RELEASE Left    x 2   COLONOSCOPY  2015   DILATION AND CURETTAGE OF UTERUS     HYSTEROSCOPY     LASER ABLATION OF THE CERVIX     PELVIC LAPAROSCOPY     DL   UTERINE FIBROID EMBOLIZATION     wrist cystectomy Right     Allergies: Aspirin, Cephalosporins, Ciprofloxacin, Fish allergy, Nitrofurantoin monohyd macro, Nsaids, Nuzyra  [omadacycline ], Penicillins, Protonix  [pantoprazole  sodium], Sulfa antibiotics, and Sulfur  Medications: Prior to Admission medications   Medication Sig Start Date End Date Taking? Authorizing Provider  acetaminophen  (TYLENOL ) 500 MG tablet Take 500 mg by mouth every 6 (six) hours as needed.    [provider]  amLODipine  (NORVASC ) 10 MG tablet Take 1 tablet (10 mg total) by mouth daily. 11/17/23   Joshua Debby CROME, MD  Cholecalciferol  50 MCG (2000 UT) TABS Take 1 tablet (2,000 Units total) by mouth daily. 11/04/22   Joshua Debby CROME, MD  famotidine  (PEPCID )  20 MG tablet TAKE 1 TABLET(20 MG) BY MOUTH TWICE DAILY 10/12/22   Cara Elida HERO, NP  indapamide  (LOZOL ) 1.25 MG tablet TAKE 1 TABLET(1.25 MG) BY MOUTH DAILY 11/17/23   Joshua Debby CROME, MD  rosuvastatin  (CRESTOR ) 10 MG tablet TAKE 1 TABLET(10 MG) BY MOUTH DAILY 11/08/23   Joshua Debby CROME, MD     Family History  Problem Relation Age of Onset   Diabetes Mother    Hypertension Mother    Diabetes Father    Hypertension Father    Prostate cancer Father    Colon cancer Father 22   Colon cancer Brother 30   Cancer Brother        esophagus, skin   Esophageal cancer Brother    Colon polyps Sister    Hypertension Sister    Lung cancer Brother    Cancer - Other Brother        esophagal   Kidney disease Other    Irritable bowel syndrome Sister    COPD Neg Hx    Alcohol abuse Neg Hx    Depression Neg Hx    Drug abuse Neg Hx    Early death Neg Hx    Hearing loss Neg Hx    Heart disease Neg Hx    Hyperlipidemia Neg Hx    Stroke Neg Hx    Stomach cancer Neg Hx  Rectal cancer Neg Hx     Social History   Socioeconomic History   Marital status: Divorced    Spouse name: Not on file   Number of children: 1   Years of education: Not on file   Highest education level: Not on file  Occupational History   Occupation: Astronomer: Advertising copywriter  Tobacco Use   Smoking status: Never   Smokeless tobacco: Never   Tobacco comments:    Regular Exercise - Yes  Vaping Use   Vaping status: Never Used  Substance and Sexual Activity   Alcohol use: No    Alcohol/week: 0.0 standard drinks of alcohol   Drug use: No   Sexual activity: Yes    Birth control/protection: Post-menopausal    Comment: 1st intercourse- 16, partners - 3  Other Topics Concern   Not on file  Social History Narrative   Not on file   Social Drivers of Health   Financial Resource Strain: Not on file  Food Insecurity: No Food Insecurity (10/12/2023)   Hunger Vital Sign    Worried About  Running Out of Food in the Last Year: Never true    Ran Out of Food in the Last Year: Never true  Transportation Needs: No Transportation Needs (10/12/2023)   PRAPARE - Administrator, Civil Service (Medical): No    Lack of Transportation (Non-Medical): No  Physical Activity: Not on file  Stress: Not on file  Social Connections: Not on file       Review of Systems  Vital Signs: LMP 03/26/2011   Advance Care Plan: no documents on file    Physical Exam  Imaging: No results found.  Labs:  CBC: Recent Labs    09/06/23 1610  WBC 8.7  HGB 14.5  HCT 42.8  PLT 319.0    COAGS: No results for input(s): INR, APTT in the last 8760 hours.  BMP: Recent Labs    09/06/23 1610  NA 138  K 3.5  CL 99  CO2 26  GLUCOSE 100*  BUN 11  CALCIUM  9.5  CREATININE 0.70    LIVER FUNCTION TESTS: Recent Labs    09/06/23 1610  BILITOT 0.5  AST 26  ALT 21  ALKPHOS 87  PROT 7.5  7.9  ALBUMIN 4.6    TUMOR MARKERS: No results for input(s): AFPTM, CEA, CA199, CHROMGRNA in the last 8760 hours.  Assessment and Plan:  63 y.o. female with PMH sig for anxiety, asthma, cervical dysplasia, GERD, HTN, IBS, uterine fibroid who presents now with MGUS. She is scheduled today for image guided bone marrow biopsy to rule out myeloma. Risks and benefits of procedure was discussed with the patient and/or patient's family including, but not limited to bleeding, infection, damage to adjacent structures or low yield requiring additional tests.  All of the questions were answered and there is agreement to proceed.  Consent signed and in chart.    Thank you for allowing our service to participate in Latyra Jaye 's care.  Electronically Signed: D. Franky Rakers, PA-C   11/28/2023, 2:55 PM      I spent a total of 20 Minutes    in face to face in clinical consultation, greater than 50% of which was counseling/coordinating care for image guided bone marrow  biopsy

## 2023-11-29 ENCOUNTER — Other Ambulatory Visit: Payer: Self-pay | Admitting: Student

## 2023-11-29 DIAGNOSIS — R778 Other specified abnormalities of plasma proteins: Secondary | ICD-10-CM

## 2023-11-30 ENCOUNTER — Ambulatory Visit (HOSPITAL_COMMUNITY)
Admission: RE | Admit: 2023-11-30 | Discharge: 2023-11-30 | Disposition: A | Discharge status: Home / Self Care | Source: Ambulatory Visit | Attending: Hematology | Admitting: Hematology

## 2023-11-30 ENCOUNTER — Other Ambulatory Visit: Payer: Self-pay

## 2023-11-30 ENCOUNTER — Encounter (HOSPITAL_COMMUNITY): Payer: Self-pay

## 2023-11-30 DIAGNOSIS — D472 Monoclonal gammopathy: Secondary | ICD-10-CM | POA: Insufficient documentation

## 2023-11-30 DIAGNOSIS — R778 Other specified abnormalities of plasma proteins: Secondary | ICD-10-CM

## 2023-11-30 HISTORY — PX: IR BONE MARROW BIOPSY & ASPIRATION: IMG5727

## 2023-11-30 LAB — CBC WITH DIFFERENTIAL/PLATELET
Abs Immature Granulocytes: 0.02 K/uL (ref 0.00–0.07)
Basophils Absolute: 0 K/uL (ref 0.0–0.1)
Basophils Relative: 1 %
Eosinophils Absolute: 0.1 K/uL (ref 0.0–0.5)
Eosinophils Relative: 1 %
HCT: 42.6 % (ref 36.0–46.0)
Hemoglobin: 13.6 g/dL (ref 12.0–15.0)
Immature Granulocytes: 0 %
Lymphocytes Relative: 38 %
Lymphs Abs: 2.3 K/uL (ref 0.7–4.0)
MCH: 27 pg (ref 26.0–34.0)
MCHC: 31.9 g/dL (ref 30.0–36.0)
MCV: 84.7 fL (ref 80.0–100.0)
Monocytes Absolute: 0.6 K/uL (ref 0.1–1.0)
Monocytes Relative: 10 %
Neutro Abs: 3.1 K/uL (ref 1.7–7.7)
Neutrophils Relative %: 50 %
Platelets: 336 K/uL (ref 150–400)
RBC: 5.03 MIL/uL (ref 3.87–5.11)
RDW: 14.1 % (ref 11.5–15.5)
WBC: 6 K/uL (ref 4.0–10.5)
nRBC: 0 % (ref 0.0–0.2)

## 2023-11-30 MED ORDER — FENTANYL CITRATE (PF) 100 MCG/2ML IJ SOLN
INTRAMUSCULAR | Status: AC | PRN
  Administered 2023-11-30: 50 ug via INTRAVENOUS

## 2023-11-30 MED ORDER — MIDAZOLAM HCL 2 MG/2ML IJ SOLN
INTRAMUSCULAR | Status: AC
  Filled 2023-11-30: qty 2

## 2023-11-30 MED ORDER — LIDOCAINE HCL (PF) 1 % IJ SOLN
INTRAMUSCULAR | Status: AC
Start: 2023-11-30 — End: 2023-11-30
  Filled 2023-11-30: qty 30

## 2023-11-30 MED ORDER — FENTANYL CITRATE (PF) 100 MCG/2ML IJ SOLN
INTRAMUSCULAR | Status: AC
  Filled 2023-11-30: qty 2

## 2023-11-30 MED ORDER — LIDOCAINE HCL (PF) 1 % IJ SOLN
20.0000 mL | Freq: Once | INTRAMUSCULAR | Status: AC
  Administered 2023-11-30: 10 mL via INTRADERMAL

## 2023-11-30 MED ORDER — SODIUM CHLORIDE 0.9 % IV SOLN: INTRAVENOUS | Status: DC

## 2023-11-30 MED ORDER — FENTANYL CITRATE (PF) 100 MCG/2ML IJ SOLN
INTRAMUSCULAR | Status: AC
Start: 2023-11-30 — End: 2023-11-30
  Filled 2023-11-30: qty 2

## 2023-11-30 MED ORDER — MIDAZOLAM HCL 2 MG/2ML IJ SOLN
INTRAMUSCULAR | Status: AC | PRN
Start: 2023-11-30 — End: 2023-11-30
  Administered 2023-11-30: 1 mg via INTRAVENOUS

## 2023-11-30 NOTE — Progress Notes (Signed)
 1200 Ice bag given to use to back as needed for comfort per instructions.

## 2023-11-30 NOTE — Procedures (Signed)
 Interventional Radiology Procedure:   Indications: MGUS  Procedure: Image guided bone marrow biopsy  Findings: 3 aspirates and 2 cores from right ilium  Complications: None     EBL: Minimal, less than 10 ml  Plan: Discharge to home in one hour.   Charlea Nardo R. Philip, MD  Pager: [phone]

## 2023-11-30 NOTE — Discharge Instructions (Addendum)
Discharge Instructions:   Please call Interventional Radiology clinic [phone] with any questions or concerns.  You may remove your dressing and shower tomorrow.    Bone Marrow Aspiration and Bone Marrow Biopsy, Adult, Care After This sheet gives you information about how to care for yourself after your procedure. Your health care provider may also give you more specific instructions. If you have problems or questions, contact your health care provider. What can I expect after the procedure? After the procedure, it is common to have: Mild pain and tenderness. Swelling. Bruising. Follow these instructions at home: Puncture site care  Follow instructions from your health care provider about how to take care of the puncture site. Make sure you: Wash your hands with soap and water before and after you change your bandage (dressing). If soap and water are not available, use hand sanitizer. Change your dressing as told by your health care provider. Check your puncture site every day for signs of infection. Check for: More redness, swelling, or pain. Fluid or blood. Warmth. Pus or a bad smell. Activity Return to your normal activities as told by your health care provider. Ask your health care provider what activities are safe for you. Do not lift anything that is heavier than 10 lb (4.5 kg), or the limit that you are told, until your health care provider says that it is safe. Do not drive for 24 hours if you were given a sedative during your procedure. General instructions  Take over-the-counter and prescription medicines only as told by your health care provider. Do not take baths, swim, or use a hot tub until your health care provider approves. Ask your health care provider if you may take showers. You may only be allowed to take sponge baths. If directed, put ice on the affected area. To do this: Put ice in a plastic bag. Place a towel between your skin and the bag. Leave the ice  on for 20 minutes, 2-3 times a day. Keep all follow-up visits as told by your health care provider. This is important. Contact a health care provider if: Your pain is not controlled with medicine. You have a fever. You have more redness, swelling, or pain around the puncture site. You have fluid or blood coming from the puncture site. Your puncture site feels warm to the touch. You have pus or a bad smell coming from the puncture site. Summary After the procedure, it is common to have mild pain, tenderness, swelling, and bruising. Follow instructions from your health care provider about how to take care of the puncture site and what activities are safe for you. Take over-the-counter and prescription medicines only as told by your health care provider. Contact a health care provider if you have any signs of infection, such as fluid or blood coming from the puncture site. This information is not intended to replace advice given to you by your health care provider. Make sure you discuss any questions you have with your health care provider. Document Revised: 09/13/2018 Document Reviewed: 09/13/2018 Elsevier Patient Education  2023 Elsevier Inc.   Moderate Conscious Sedation, Adult, Care After This sheet gives you information about how to care for yourself after your procedure. Your health care provider may also give you more specific instructions. If you have problems or questions, contact your health care provider. What can I expect after the procedure? After the procedure, it is common to have: Sleepiness for several hours. Impaired judgment for several hours. Difficulty with balance. Vomiting if   you eat too soon. Follow these instructions at home: For the time period you were told by your health care provider: Rest. Do not participate in activities where you could fall or become injured. Do not drive or use machinery. Do not drink alcohol. Do not take sleeping pills or medicines that  cause drowsiness. Do not make important decisions or sign legal documents. Do not take care of children on your own. Eating and drinking  Follow the diet recommended by your health care provider. Drink enough fluid to keep your urine pale yellow. If you vomit: Drink water, juice, or soup when you can drink without vomiting. Make sure you have little or no nausea before eating solid foods. General instructions Take over-the-counter and prescription medicines only as told by your health care provider. Have a responsible adult stay with you for the time you are told. It is important to have someone help care for you until you are awake and alert. Do not smoke. Keep all follow-up visits as told by your health care provider. This is important. Contact a health care provider if: You are still sleepy or having trouble with balance after 24 hours. You feel light-headed. You keep feeling nauseous or you keep vomiting. You develop a rash. You have a fever. You have redness or swelling around the IV site. Get help right away if: You have trouble breathing. You have new-onset confusion at home. Summary After the procedure, it is common to feel sleepy, have impaired judgment, or feel nauseous if you eat too soon. Rest after you get home. Know the things you should not do after the procedure. Follow the diet recommended by your health care provider and drink enough fluid to keep your urine pale yellow. Get help right away if you have trouble breathing or new-onset confusion at home. This information is not intended to replace advice given to you by your health care provider. Make sure you discuss any questions you have with your health care provider. Document Revised: 08/25/2019 Document Reviewed: 03/23/2019 Elsevier Patient Education  2023 Elsevier Inc.  

## 2023-12-02 LAB — SURGICAL PATHOLOGY

## 2023-12-04 ENCOUNTER — Other Ambulatory Visit: Payer: Self-pay | Admitting: Nurse Practitioner

## 2023-12-07 ENCOUNTER — Inpatient Hospital Stay: Attending: Nurse Practitioner | Admitting: Hematology

## 2023-12-07 VITALS — BP 156/75 | HR 90 | Temp 98.1°F | Resp 20 | Wt 233.4 lb

## 2023-12-07 DIAGNOSIS — M170 Bilateral primary osteoarthritis of knee: Secondary | ICD-10-CM | POA: Diagnosis not present

## 2023-12-07 DIAGNOSIS — D472 Monoclonal gammopathy: Secondary | ICD-10-CM | POA: Diagnosis not present

## 2023-12-07 DIAGNOSIS — M160 Bilateral primary osteoarthritis of hip: Secondary | ICD-10-CM | POA: Insufficient documentation

## 2023-12-07 DIAGNOSIS — M898X9 Other specified disorders of bone, unspecified site: Secondary | ICD-10-CM | POA: Diagnosis not present

## 2023-12-07 NOTE — Progress Notes (Signed)
 [facility]   [phone] [phone]   Clinic Follow up Note   Patient Care Team: Joshua Debby CROME, MD as PCP - General Elana Montie CROME, RN as Oncology Nurse Navigator  Date of Service:  12/07/2023  CHIEF COMPLAINT: f/u of MGUS  CURRENT THERAPY:  Observation  Oncology History   IgA monoclonal gammopathy of undetermined significance (MGUS) IgA lambda type  -Discovered on lab, M protein 0.5, immunofixation showed IgA lambda type, serum lambda light chain 113, no anemia, hypercalcemia, or renal insufficiency.  Bone survey was negative. - Bone marrow biopsy in July 2025 showed Lambda restricted plasma cell neoplasm involving approximately 5 to  10%  -Continue surveillance.  Assessment & Plan Monoclonal gammopathy of undetermined significance (MGUS), IgA lambda type Bone marrow biopsy confirms MGUS, IgA lambda type, with 5-10% plasma cells. Urine protein is positive but low at 9 mg. Lambda light chain level is four times above normal. M protein level is 0.5, considered low. Overall low risk for progression to multiple myeloma. IgA type is higher risk than IgG, but current findings indicate low risk category. - Perform blood work every six months and communicate results. - Schedule follow-up visit in six months. - Conduct urine tests annually. - Instruct to monitor for persistent bone pain in larger bones such as spine, sternum, rib cage, or pelvic area, and report if it occurs.  Plan - Lab and bone marrow biopsy reviewed with patient - Phone visit in 6 months with lab or 2 weeks prior    Discussed the use of AI scribe software for clinical note transcription with the patient, who gave verbal consent to proceed.  History of Present Illness Betty Bell is a 62 year old female with MGUS who presents for follow-up to discuss bone marrow biopsy results. She is accompanied by her sister.  She recently underwent a bone marrow  biopsy without ongoing pain or complications. Urine protein is positive but low at 9 mg. Lambda light chain level is elevated at four times the normal level, and M protein level is 0.5. She experiences bone pain due to osteoarthritis, particularly in her knees and hips, which improves with rest and is managed with Tylenol .     All other systems were reviewed with the patient and are negative.  MEDICAL HISTORY:  Past Medical History:  Diagnosis Date   Anxiety    Asthma    Cervical dysplasia    High Grade   CIN I (cervical intraepithelial neoplasia I)    Colon polyp 2015   TUBULAR ADENOMA   Endometrial polyp    GERD (gastroesophageal reflux disease)    Hydrosalpinx    Left and Right   Hypertension    IBS (irritable bowel syndrome)    Pneumonia    Rectal burning    Seasonal allergies    Uterine fibroid     SURGICAL HISTORY: Past Surgical History:  Procedure Laterality Date   CARPAL TUNNEL RELEASE Left    x 2   COLONOSCOPY  2015   DILATION AND CURETTAGE OF UTERUS     HYSTEROSCOPY     IR BONE MARROW BIOPSY & ASPIRATION  11/30/2023   LASER ABLATION OF THE CERVIX     PELVIC LAPAROSCOPY     DL   UTERINE FIBROID EMBOLIZATION     wrist cystectomy Right     I have reviewed the social history and family history with the patient and they are unchanged from previous note.  ALLERGIES:  is  allergic to aspirin, cephalosporins, ciprofloxacin, fish allergy, nitrofurantoin monohyd macro, nsaids, nuzyra  [omadacycline ], penicillins, protonix  [pantoprazole  sodium], sulfa antibiotics, and sulfur.  MEDICATIONS:  Current Outpatient Medications  Medication Sig Dispense Refill   acetaminophen  (TYLENOL ) 500 MG tablet Take 500 mg by mouth every 6 (six) hours as needed.     amLODipine  (NORVASC ) 10 MG tablet Take 1 tablet (10 mg total) by mouth daily. 90 tablet 0   Cholecalciferol  50 MCG (2000 UT) TABS Take 1 tablet (2,000 Units total) by mouth daily. 90 tablet 1   famotidine  (PEPCID ) 20 MG  tablet TAKE 1 TABLET(20 MG) BY MOUTH TWICE DAILY 180 tablet 3   indapamide  (LOZOL ) 1.25 MG tablet TAKE 1 TABLET(1.25 MG) BY MOUTH DAILY 90 tablet 1   rosuvastatin  (CRESTOR ) 10 MG tablet TAKE 1 TABLET(10 MG) BY MOUTH DAILY 90 tablet 0   No current facility-administered medications for this visit.    PHYSICAL EXAMINATION: ECOG PERFORMANCE STATUS: 0 - Asymptomatic  Vitals:   12/07/23 1539  BP: (!) 156/75  Pulse: 90  Resp: 20  Temp: 98.1 F (36.7 C)  SpO2: 100%   Wt Readings from Last 3 Encounters:  12/07/23 233 lb 6.4 oz (105.9 kg)  11/30/23 220 lb (99.8 kg)  10/12/23 232 lb 6.4 oz (105.4 kg)     GENERAL:alert, no distress and comfortable SKIN: skin color, texture, turgor are normal, no rashes or significant lesions EYES: normal, Conjunctiva are pink and non-injected, sclera clear  Musculoskeletal:no cyanosis of digits and no clubbing  NEURO: alert & oriented x 3 with fluent speech, no focal motor/sensory deficits  Physical Exam    LABORATORY DATA:  I have reviewed the data as listed    Latest Ref Rng & Units 11/30/2023    9:21 AM 09/06/2023    4:10 PM 11/03/2022    4:34 PM  CBC  WBC 4.0 - 10.5 K/uL 6.0  8.7  7.6   Hemoglobin 12.0 - 15.0 g/dL 86.3  85.4  86.4   Hematocrit 36.0 - 46.0 % 42.6  42.8  41.9   Platelets 150 - 400 K/uL 336  319.0  309.0         Latest Ref Rng & Units 09/06/2023    4:10 PM 11/03/2022    4:34 PM 02/12/2022    9:58 AM  CMP  Glucose 70 - 99 mg/dL 899  84  891   BUN 6 - 23 mg/dL 11  8  9    Creatinine 0.40 - 1.20 mg/dL 9.29  9.25  9.29   Sodium 135 - 145 mEq/L 138  138  139   Potassium 3.5 - 5.1 mEq/L 3.5  3.6  3.8   Chloride 96 - 112 mEq/L 99  98  103   CO2 19 - 32 mEq/L 26  26  29    Calcium  8.4 - 10.5 mg/dL 9.5  89.9  9.3   Total Protein 6.1 - 8.1 g/dL 6.0 - 8.3 g/dL 7.5    7.9  8.5  7.2   Total Bilirubin 0.2 - 1.2 mg/dL 0.5  0.5  0.6   Alkaline Phos 39 - 117 U/L 87  92  74   AST 0 - 37 U/L 26  30  15    ALT 0 - 35 U/L 21  19  12         RADIOGRAPHIC STUDIES: I have personally reviewed the radiological images as listed and agreed with the findings in the report. No results found.    No orders of the defined types were  placed in this encounter.  All questions were answered. The patient knows to call the clinic with any problems, questions or concerns. No barriers to learning was detected. The total time spent in the appointment was 25 minutes, including review of chart and various tests results, discussions about plan of care and coordination of care plan     Onita Mattock, MD 12/07/2023

## 2023-12-07 NOTE — Assessment & Plan Note (Signed)
 IgA lambda type  -Discovered on lab, M protein 0.5, immunofixation showed IgA lambda type, serum lambda light chain 113, no anemia, hypercalcemia, or renal insufficiency.  Bone survey was negative. - Bone marrow biopsy in July 2025 showed Lambda restricted plasma cell neoplasm involving approximately 5 to  10%  -Continue surveillance.

## 2023-12-09 ENCOUNTER — Ambulatory Visit: Admitting: Hematology

## 2023-12-10 ENCOUNTER — Encounter (HOSPITAL_COMMUNITY): Payer: Self-pay | Admitting: Hematology

## 2023-12-15 ENCOUNTER — Other Ambulatory Visit: Payer: Self-pay | Admitting: Nurse Practitioner

## 2023-12-15 ENCOUNTER — Other Ambulatory Visit: Payer: Self-pay | Admitting: Internal Medicine

## 2023-12-15 ENCOUNTER — Other Ambulatory Visit: Payer: Self-pay

## 2023-12-15 ENCOUNTER — Telehealth: Payer: Self-pay | Admitting: Nurse Practitioner

## 2023-12-15 DIAGNOSIS — I10 Essential (primary) hypertension: Secondary | ICD-10-CM

## 2023-12-15 MED ORDER — FAMOTIDINE 20 MG PO TABS: 20.0000 mg | ORAL_TABLET | Freq: Every day | ORAL | 1 refills | Status: DC

## 2023-12-15 NOTE — Telephone Encounter (Signed)
 Copied from CRM [id number]. Topic: Clinical - Medication Refill >> Dec 15, 2023 11:54 AM Mesmerise C wrote: Medication:  indapamide  (LOZOL ) 1.25 MG tablet  Has the patient contacted their pharmacy? Yes (Agent: If no, request that the patient contact the pharmacy for the refill. If patient does not wish to contact the pharmacy document the reason why and proceed with request.) (Agent: If yes, when and what did the pharmacy advise?)  This is the patient's preferred pharmacy:  [id number] [place], [place] - [address] AT [facility] [address] [place] [address] Phone: [phone] Fax: [phone]  Is this the correct pharmacy for this prescription? Yes If no, delete pharmacy and type the correct one.   Has the prescription been filled recently? No  Is the patient out of the medication? Yes  Has the patient been seen for an appointment in the last year OR does the patient have an upcoming appointment? Yes  Can we respond through MyChart? No  Agent: Please be advised that Rx refills may take up to 3 business days. We ask that you follow-up with your pharmacy.

## 2023-12-15 NOTE — Progress Notes (Signed)
 Follow up with Dr Lanny after bone marrow biopsy. MGUS with low risk of Multiple Myeloma. Will follow patient yearly and as needed.

## 2023-12-15 NOTE — Telephone Encounter (Signed)
 Refill sent to patients pharmacy.

## 2023-12-15 NOTE — Telephone Encounter (Signed)
 Patient requesting a refill for famotidine . Patient scheduled for 9/4. Please advise, thank you

## 2023-12-16 NOTE — Telephone Encounter (Signed)
Famotidine refilled 

## 2023-12-20 MED ORDER — INDAPAMIDE 1.25 MG PO TABS
ORAL_TABLET | ORAL | 1 refills | Status: AC
Start: 2023-12-20 — End: ?

## 2024-01-13 ENCOUNTER — Encounter: Payer: Self-pay | Admitting: Gastroenterology

## 2024-01-13 ENCOUNTER — Ambulatory Visit: Admitting: Gastroenterology

## 2024-01-13 VITALS — BP 134/80 | HR 81 | Ht 63.0 in | Wt 233.0 lb

## 2024-01-13 DIAGNOSIS — K219 Gastro-esophageal reflux disease without esophagitis: Secondary | ICD-10-CM | POA: Diagnosis not present

## 2024-01-13 DIAGNOSIS — Z860101 Personal history of adenomatous and serrated colon polyps: Secondary | ICD-10-CM | POA: Diagnosis not present

## 2024-01-13 DIAGNOSIS — Z860100 Personal history of colon polyps, unspecified: Secondary | ICD-10-CM

## 2024-01-13 DIAGNOSIS — Z800 Family history of malignant neoplasm of digestive organs: Secondary | ICD-10-CM | POA: Diagnosis not present

## 2024-01-13 MED ORDER — NA SULFATE-K SULFATE-MG SULF 17.5-3.13-1.6 GM/177ML PO SOLN: 1.0000 | Freq: Once | ORAL | 0 refills | Status: AC

## 2024-01-13 MED ORDER — FAMOTIDINE 20 MG PO TABS
20.0000 mg | ORAL_TABLET | Freq: Two times a day (BID) | ORAL | 6 refills | Status: AC
Start: 2024-01-13 — End: ?

## 2024-01-13 NOTE — Patient Instructions (Addendum)
 GERD Recommend GERD diet Continue famotidine  20 mg twice daily, refilled  You have been scheduled for an Endoscopy and Colonoscopy. Please follow the written instructions given to you at your visit today.  If you use inhalers (even only as needed), please bring them with you on the day of your procedure.  DO NOT TAKE 7 DAYS PRIOR TO TEST- Trulicity (dulaglutide) Ozempic, Wegovy (semaglutide) Mounjaro (tirzepatide) Bydureon Bcise (exanatide extended release)  DO NOT TAKE 1 DAY PRIOR TO YOUR TEST Rybelsus (semaglutide) Adlyxin (lixisenatide) Victoza (liraglutide) Byetta (exanatide) ___________________________________________________________________________  Please follow up sooner if symptoms increase or worsen __________________________________________________________________________  Due to recent changes in healthcare laws, you may see the results of your imaging and laboratory studies on MyChart before your provider has had a chance to review them.  We understand that in some cases there may be results that are confusing or concerning to you. Not all laboratory results come back in the same time frame and the provider may be waiting for multiple results in order to interpret others.  Please give us  48 hours in order for your provider to thoroughly review all the results before contacting the office for clarification of your results.   Thank you for trusting me with your gastrointestinal care!   Deanna May, NP _______________________________________________________  If your blood pressure at your visit was 140/90 or greater, please contact your primary care physician to follow up on this.  _______________________________________________________  If you are age 85 or older, your body mass index should be between 23-30. Your Body mass index is 41.27 kg/m. If this is out of the aforementioned range listed, please consider follow up with your Primary Care Provider.  If you are age  67 or younger, your body mass index should be between 19-25. Your Body mass index is 41.27 kg/m. If this is out of the aformentioned range listed, please consider follow up with your Primary Care Provider.   ________________________________________________________  The [facility] providers would like to encourage you to use MYCHART to communicate with providers for non-urgent requests or questions.  Due to long hold times on the telephone, sending your provider a message by [facility] may be a faster and more efficient way to get a response.  Please allow 48 business hours for a response.  Please remember that this is for non-urgent requests.  _______________________________________________________

## 2024-01-13 NOTE — Progress Notes (Signed)
 Chief Complaint:follow-up GERD Primary GI Doctor:(previously Dr. Eda) Dr. San  HPI:  Betty Bell is a 63 year old female with a past medical history of anxiety, hypertensin, asthma, GERD and colon polyps who presents for follow-up GERD. Patient last seen in GI office by Elida, NP on 02/12/22 for follow-up on anal fissure and abdominal pain.  Interval History    Patient presents for follow-up on reflux.  Patient is currently taking famotidine  20 mg twice daily which she states manages her symptoms. Patient denies dysphagia.      She reports she no longer has issues with constipation. She is no longer taking anything for constipation. No blood in stool. No rectal pain.  New medical history: hematuria in urine, had workup with Hematology. Discovered on lab, M protein 0.5, immunofixation showed IgA lambda type, serum lambda light chain 113, no anemia, hypercalcemia, or renal insufficiency. Bone survey was negative. Follow-up 1 year.   No alcohol use. Nonsmoker  She takes baby ASA 81 mg, does not take daily.   Surgical history: none   Patient's family history: brother had esophageal CA, no history of colon CA  Wt Readings from Last 3 Encounters:  01/13/24 233 lb (105.7 kg)  12/07/23 233 lb 6.4 oz (105.9 kg)  11/30/23 220 lb (99.8 kg)    GI Procedures: Colonoscopy 01/08/2020: recall 3 years - One 5 mm polyp in the descending colon, removed with a cold snare. Resected and retrieved. - Two 1 to 2 mm polyps in the ascending colon and in the cecum, removed with a cold snare. Resected and retrieved. - The examination was otherwise normal on direct and retroflexion views. - 3 year colonoscopy  TUBULAR ADENOMA(S). - NO HIGH GRADE DYSPLASIA OR CARCINOMA   EGD 02/28/2018: - Normal esophagus. - Non-bleeding gastric ulcer with no stigmata of bleeding. - Non-bleeding erosive gastropathy. Biopsied. - Normal examined duodenum. GASTRIC ANTRAL AND OXYNTIC MUCOSA WITH NO  SPECIFIC HISTOPATHOLOGIC CHANGES - WARTHIN STARRY STAIN IS NEGATIVE FOR HELICOBACTER PYLORI  01/14/2022 Abdominal xray 2 view IMPRESSION: Nonobstructive bowel gas pattern.  Past Medical History:  Diagnosis Date   Anxiety    Asthma    Cervical dysplasia    High Grade   CIN I (cervical intraepithelial neoplasia I)    Colon polyp 2015   TUBULAR ADENOMA   Endometrial polyp    GERD (gastroesophageal reflux disease)    Hydrosalpinx    Left and Right   Hypertension    IBS (irritable bowel syndrome)    Pneumonia    Rectal burning    Seasonal allergies    Uterine fibroid     Past Surgical History:  Procedure Laterality Date   CARPAL TUNNEL RELEASE Left    x 2   COLONOSCOPY  2015   DILATION AND CURETTAGE OF UTERUS     HYSTEROSCOPY     IR BONE MARROW BIOPSY & ASPIRATION  11/30/2023   LASER ABLATION OF THE CERVIX     PELVIC LAPAROSCOPY     DL   UTERINE FIBROID EMBOLIZATION     wrist cystectomy Right     Current Outpatient Medications  Medication Sig Dispense Refill   acetaminophen  (TYLENOL ) 500 MG tablet Take 500 mg by mouth every 6 (six) hours as needed.     amLODipine  (NORVASC ) 10 MG tablet Take 1 tablet (10 mg total) by mouth daily. 90 tablet 0   Cholecalciferol  50 MCG (2000 UT) TABS Take 1 tablet (2,000 Units total) by mouth daily. 90 tablet 1  indapamide  (LOZOL ) 1.25 MG tablet TAKE 1 TABLET(1.25 MG) BY MOUTH DAILY. NEEDS APPOINTMENT FOR REFILLS 30 tablet 1   Na Sulfate-K Sulfate-Mg Sulfate concentrate (SUPREP) 17.5-3.13-1.6 GM/177ML SOLN Take 1 kit (354 mLs total) by mouth once for 1 dose. 354 mL 0   rosuvastatin  (CRESTOR ) 10 MG tablet TAKE 1 TABLET(10 MG) BY MOUTH DAILY 90 tablet 0   famotidine  (PEPCID ) 20 MG tablet Take 1 tablet (20 mg total) by mouth 2 (two) times daily. 60 tablet 6   No current facility-administered medications for this visit.    Allergies as of 01/13/2024 - Review Complete 01/13/2024  Allergen Reaction Noted   Aspirin     Cephalosporins   06/08/2011   Ciprofloxacin  06/08/2011   Fish allergy  08/04/2013   Nitrofurantoin monohyd macro  06/08/2011   Nsaids Other (See Comments) 09/06/2023   Nuzyra  [omadacycline ] Nausea And Vomiting and Other (See Comments) 07/11/2021   Penicillins  06/08/2011   Protonix  [pantoprazole  sodium]  02/28/2014   Sulfa antibiotics  06/08/2011   Sulfur Swelling 12/04/2015    Family History  Problem Relation Age of Onset   Diabetes Mother    Hypertension Mother    Diabetes Father    Hypertension Father    Prostate cancer Father    Colon cancer Father 50   Colon cancer Brother 27   Cancer Brother        esophagus, skin   Esophageal cancer Brother    Colon polyps Sister    Hypertension Sister    Lung cancer Brother    Cancer - Other Brother        esophagal   Kidney disease Other    Irritable bowel syndrome Sister    COPD Neg Hx    Alcohol abuse Neg Hx    Depression Neg Hx    Drug abuse Neg Hx    Early death Neg Hx    Hearing loss Neg Hx    Heart disease Neg Hx    Hyperlipidemia Neg Hx    Stroke Neg Hx    Stomach cancer Neg Hx    Rectal cancer Neg Hx     Review of Systems:    Constitutional: No weight loss, fever, chills, weakness or fatigue HEENT: Eyes: No change in vision               Ears, Nose, Throat:  No change in hearing or congestion Skin: No rash or itching Cardiovascular: No chest pain, chest pressure or palpitations   Respiratory: No SOB or cough Gastrointestinal: See HPI and otherwise negative Genitourinary: No dysuria or change in urinary frequency Neurological: No headache, dizziness or syncope Musculoskeletal: No new muscle or joint pain Hematologic: No bleeding or bruising Psychiatric: No history of depression or anxiety    Physical Exam:  Vital signs: BP 134/80   Pulse 81   Ht 5' 3 (1.6 m)   Wt 233 lb (105.7 kg)   LMP 03/26/2011   BMI 41.27 kg/m   Constitutional:   Pleasant female appears to be in NAD, Well developed, Well nourished, alert and  cooperative Throat: Oral cavity and pharynx without inflammation, swelling or lesion.  Respiratory: Respirations even and unlabored. Lungs clear to auscultation bilaterally.   No wheezes, crackles, or rhonchi.  Cardiovascular: Normal S1, S2. Regular rate and rhythm. No peripheral edema, cyanosis or pallor.  Gastrointestinal:  Soft, nondistended, nontender. Obese. No rebound or guarding. Normal bowel sounds. No appreciable masses or hepatomegaly. Rectal:  Not performed.  Msk:  Symmetrical without gross  deformities. Without edema, no deformity or joint abnormality.  Neurologic:  Alert and  oriented x4;  grossly normal neurologically.  Skin:   Dry and intact without significant lesions or rashes.  RELEVANT LABS AND IMAGING: CBC    Latest Ref Rng & Units 11/30/2023    9:21 AM 09/06/2023    4:10 PM 11/03/2022    4:34 PM  CBC  WBC 4.0 - 10.5 K/uL 6.0  8.7  7.6   Hemoglobin 12.0 - 15.0 g/dL 86.3  85.4  86.4   Hematocrit 36.0 - 46.0 % 42.6  42.8  41.9   Platelets 150 - 400 K/uL 336  319.0  309.0      CMP     Latest Ref Rng & Units 09/06/2023    4:10 PM 11/03/2022    4:34 PM 02/12/2022    9:58 AM  CMP  Glucose 70 - 99 mg/dL 899  84  891   BUN 6 - 23 mg/dL 11  8  9    Creatinine 0.40 - 1.20 mg/dL 9.29  9.25  9.29   Sodium 135 - 145 mEq/L 138  138  139   Potassium 3.5 - 5.1 mEq/L 3.5  3.6  3.8   Chloride 96 - 112 mEq/L 99  98  103   CO2 19 - 32 mEq/L 26  26  29    Calcium  8.4 - 10.5 mg/dL 9.5  89.9  9.3   Total Protein 6.1 - 8.1 g/dL 6.0 - 8.3 g/dL 7.5    7.9  8.5  7.2   Total Bilirubin 0.2 - 1.2 mg/dL 0.5  0.5  0.6   Alkaline Phos 39 - 117 U/L 87  92  74   AST 0 - 37 U/L 26  30  15    ALT 0 - 35 U/L 21  19  12       Lab Results  Component Value Date   TSH 0.86 11/03/2022     Assessment: Encounter Diagnoses  Name Primary?   Gastroesophageal reflux disease, unspecified whether esophagitis present Yes   Family history of esophageal cancer    History of colonic polyps       63 year old female patient with history of GERD that is manage on famotidine  20 mg twice daily.  Patient has occasional breakthrough symptoms.  Family history of esophageal cancer in brother will go ahead and schedule upper GI endoscopy for evaluation.     Patient also has history of colon polyps, tubular adenomas and overdue for colon screening colonoscopy will go ahead and schedule in [facility] with Dr. San. No lower GI issues at this time.   Plan: -  Reinforced GERD diet, no late meals -Continue famotidine  20 mg twice daily, refilled -Schedule EGD in [facility] with Dr. San. The risks and benefits of EGD with possible biopsies and esophageal dilation were discussed with the patient who agrees to proceed. -Schedule for a colonoscopy in [facility] with Dr. san . The risks and benefits of colonoscopy with possible polypectomy / biopsies were discussed and the patient agrees to proceed.    Thank you for the courtesy of this consult. Please call me with any questions or concerns.   Jermall Isaacson, FNP-C [facility] 01/13/2024, 3:27 PM  Cc: Joshua Debby CROME, MD

## 2024-01-20 NOTE — Progress Notes (Signed)
 Agree with the assessment and plan as outlined by [facility], FNP-C.  Carlitos Bottino, DO, [facility]

## 2024-02-11 ENCOUNTER — Other Ambulatory Visit: Payer: Self-pay | Admitting: Internal Medicine

## 2024-02-11 DIAGNOSIS — E785 Hyperlipidemia, unspecified: Secondary | ICD-10-CM

## 2024-03-06 ENCOUNTER — Encounter: Payer: Self-pay | Admitting: Gastroenterology

## 2024-03-06 ENCOUNTER — Ambulatory Visit (AMBULATORY_SURGERY_CENTER): Admitting: Gastroenterology

## 2024-03-06 VITALS — BP 140/86 | HR 89 | Temp 98.2°F | Resp 19 | Ht 63.0 in | Wt 233.0 lb

## 2024-03-06 DIAGNOSIS — Z800 Family history of malignant neoplasm of digestive organs: Secondary | ICD-10-CM | POA: Diagnosis not present

## 2024-03-06 DIAGNOSIS — Z13810 Encounter for screening for upper gastrointestinal disorder: Secondary | ICD-10-CM | POA: Diagnosis not present

## 2024-03-06 DIAGNOSIS — Z860100 Personal history of colon polyps, unspecified: Secondary | ICD-10-CM

## 2024-03-06 DIAGNOSIS — Z1211 Encounter for screening for malignant neoplasm of colon: Secondary | ICD-10-CM

## 2024-03-06 DIAGNOSIS — K648 Other hemorrhoids: Secondary | ICD-10-CM

## 2024-03-06 DIAGNOSIS — Z860101 Personal history of adenomatous and serrated colon polyps: Secondary | ICD-10-CM

## 2024-03-06 DIAGNOSIS — K562 Volvulus: Secondary | ICD-10-CM

## 2024-03-06 DIAGNOSIS — K5730 Diverticulosis of large intestine without perforation or abscess without bleeding: Secondary | ICD-10-CM | POA: Diagnosis not present

## 2024-03-06 DIAGNOSIS — K640 First degree hemorrhoids: Secondary | ICD-10-CM

## 2024-03-06 DIAGNOSIS — K219 Gastro-esophageal reflux disease without esophagitis: Secondary | ICD-10-CM | POA: Diagnosis not present

## 2024-03-06 MED ORDER — SODIUM CHLORIDE 0.9 % IV SOLN: 500.0000 mL | Freq: Once | INTRAVENOUS | Status: DC

## 2024-03-06 NOTE — Op Note (Signed)
 [facility] Patient Name: Betty Bell Procedure Date: 03/06/2024 11:10 AM MRN: [id number] Endoscopist: Sandor Flatter , MD, [id number] Age: 63 Referring MD:  Date of Birth: 1960/08/18 Gender: Female Account #: [id number] Procedure:                Colonoscopy Indications:              Surveillance: Personal history of adenomatous                            polyps on last colonoscopy > 3 years ago                           Last colonoscopy was 12/2019 and notable for 3                            subcentimeter adenomas. Medicines:                Monitored Anesthesia Care Procedure:                Pre-Anesthesia Assessment:                           - Prior to the procedure, a History and Physical                            was performed, and patient medications and                            allergies were reviewed. The patient's tolerance of                            previous anesthesia was also reviewed. The risks                            and benefits of the procedure and the sedation                            options and risks were discussed with the patient.                            All questions were answered, and informed consent                            was obtained. Prior Anticoagulants: The patient has                            taken no anticoagulant or antiplatelet agents. ASA                            Grade Assessment: III - A patient with severe                            systemic disease. After reviewing the risks and  benefits, the patient was deemed in satisfactory                            condition to undergo the procedure.                           After obtaining informed consent, the colonoscope                            was passed under direct vision. Throughout the                            procedure, the patient's blood pressure, pulse, and                            oxygen saturations were monitored  continuously. The                            CF [id number] #[id number] was introduced through the anus                            with the intention of advancing to the cecum. The                            scope was advanced to the ascending colon before                            the procedure was aborted. Medications were given.                            The colonoscopy was technically difficult and                            complex due to significant looping. Successful                            completion of the procedure was aided by changing                            the patient to a supine position, changing the                            patient to a prone position, straightening and                            shortening the scope to obtain bowel loop reduction                            and applying abdominal pressure. The patient                            tolerated the procedure well. The quality of the  bowel preparation was good. The ileocecal valve and                            the rectum were photographed. Scope In: 1:15:39 PM Scope Out: 1:50:59 PM Scope Withdrawal Time: 0 hours 5 minutes 13 seconds  Total Procedure Duration: 0 hours 35 minutes 20 seconds  Findings:                 The perianal and digital rectal examinations were                            normal.                           A few small-mouthed diverticula were found in the                            sigmoid colon.                           The hepatic flexure and ascending colon revealed                            grossly excessive looping. Advancing the scope                            required changing the patient to a supine position,                            changing the patient to a prone position, using                            manual pressure, water submersion, and                            straightening and shortening the scope to obtain                             bowel loop reduction. Able to view the ileocecal                            valve, but not fully intubate the cecum, and                            therefore this exam is considered incomplete from a                            screening/surveillance standpoint.                           Non-bleeding internal hemorrhoids were found during                            retroflexion. The hemorrhoids were small. Complications:            No immediate complications. Estimated Blood  Loss:     Estimated blood loss: none. Impression:               - Diverticulosis in the sigmoid colon.                           - There was significant looping of the colon with                            redundancy.                           - Non-bleeding internal hemorrhoids.                           - No specimens collected. Recommendation:           - Patient has a contact number available for                            emergencies. The signs and symptoms of potential                            delayed complications were discussed with the                            patient. Return to normal activities tomorrow.                            Written discharge instructions were provided to the                            patient.                           - Resume previous diet.                           - Continue present medications.                           - Perform a virtual colonoscopy at appointment to                            be scheduled for completion of colon cancer                            screening.                           - For any future colonoscopies, recommend abdominal                            binder placement in preoperative area due to                            significant looping and redundancy. Sandor Flatter, MD 03/06/2024 2:05:59 PM

## 2024-03-06 NOTE — Progress Notes (Signed)
To PACU, VSS. Report to [email or web address].tb 

## 2024-03-06 NOTE — Patient Instructions (Addendum)
-  Handout on diverticulosis and hemorrhoids provided  YOU HAD AN ENDOSCOPIC PROCEDURE TODAY AT THE [facility]:   Refer to the procedure report that was given to you for any specific questions about what was found during the examination.  If the procedure report does not answer your questions, please call your gastroenterologist to clarify.  If you requested that your care partner not be given the details of your procedure findings, then the procedure report has been included in a sealed envelope for you to review at your convenience later.  YOU SHOULD EXPECT: Some feelings of bloating in the abdomen. Passage of more gas than usual.  Walking can help get rid of the air that was put into your GI tract during the procedure and reduce the bloating. If you had a lower endoscopy (such as a colonoscopy or flexible sigmoidoscopy) you may notice spotting of blood in your stool or on the toilet paper. If you underwent a bowel prep for your procedure, you may not have a normal bowel movement for a few days.  Please Note:  You might notice some irritation and congestion in your nose or some drainage.  This is from the oxygen used during your procedure.  There is no need for concern and it should clear up in a day or so.  SYMPTOMS TO REPORT IMMEDIATELY:  Following lower endoscopy (colonoscopy or flexible sigmoidoscopy):  Excessive amounts of blood in the stool  Significant tenderness or worsening of abdominal pains  Swelling of the abdomen that is new, acute  Fever of 100F or higher  Following upper endoscopy (EGD)  Vomiting of blood or coffee ground material  New chest pain or pain under the shoulder blades  Painful or persistently difficult swallowing  New shortness of breath  Fever of 100F or higher  Black, tarry-looking stools  For urgent or emergent issues, a gastroenterologist can be reached at any hour by calling [phone]. Do not use MyChart messaging for urgent concerns.     DIET:  We do recommend a small meal at first, but then you may proceed to your regular diet.  Drink plenty of fluids but you should avoid alcoholic beverages for 24 hours.  ACTIVITY:  You should plan to take it easy for the rest of today and you should NOT DRIVE or use heavy machinery until tomorrow (because of the sedation medicines used during the test).    FOLLOW UP: Our staff will call the number listed on your records the next business day following your procedure.  We will call around 7:15- 8:00 am to check on you and address any questions or concerns that you may have regarding the information given to you following your procedure. If we do not reach you, we will leave a message.     If any biopsies were taken you will be contacted by phone or by letter within the next 1-3 weeks.  Please call us  at [phone] if you have not heard about the biopsies in 3 weeks.    SIGNATURES/CONFIDENTIALITY: You and/or your care partner have signed paperwork which will be entered into your electronic medical record.  These signatures attest to the fact that that the information above on your After Visit Summary has been reviewed and is understood.  Full responsibility of the confidentiality of this discharge information lies with you and/or your care-partner.

## 2024-03-06 NOTE — Op Note (Signed)
 [facility] Patient Name: Betty Bell Procedure Date: 03/06/2024 11:13 AM MRN: [id number] Endoscopist: Sandor Flatter , MD, [id number] Age: 63 Referring MD:  Date of Birth: 1961-04-11 Gender: Female Account #: [id number] Procedure:                Upper GI endoscopy Indications:              Esophageal reflux, Screening for Barrett's                            esophagus, Family history of esophageal cancer Medicines:                Monitored Anesthesia Care Procedure:                Pre-Anesthesia Assessment:                           - Prior to the procedure, a History and Physical                            was performed, and patient medications and                            allergies were reviewed. The patient's tolerance of                            previous anesthesia was also reviewed. The risks                            and benefits of the procedure and the sedation                            options and risks were discussed with the patient.                            All questions were answered, and informed consent                            was obtained. Prior Anticoagulants: The patient has                            taken no anticoagulant or antiplatelet agents. ASA                            Grade Assessment: III - A patient with severe                            systemic disease. After reviewing the risks and                            benefits, the patient was deemed in satisfactory                            condition to undergo the procedure.  After obtaining informed consent, the endoscope was                            passed under direct vision. Throughout the                            procedure, the patient's blood pressure, pulse, and                            oxygen saturations were monitored continuously. The                            Olympus Scope SN [id number] was introduced through the                             mouth, and advanced to the third part of duodenum.                            The upper GI endoscopy was accomplished without                            difficulty. The patient tolerated the procedure                            well. Scope In: Scope Out: Findings:                 The examined esophagus was normal.                           The Z-line was regular and was found 35 cm from the                            incisors.                           The gastroesophageal flap valve was visualized                            endoscopically and classified as Hill Grade III                            (minimal fold, loose to endoscope, hiatal hernia                            likely).                           The entire examined stomach was normal.                           The examined duodenum was normal. Complications:            No immediate complications. Estimated Blood Loss:     Estimated blood loss: none. Impression:               - Normal esophagus.                           -  Z-line regular, 35 cm from the incisors.                           - Gastroesophageal flap valve classified as Hill                            Grade III (minimal fold, loose to endoscope, hiatal                            hernia likely).                           - Normal stomach.                           - Normal examined duodenum.                           - No specimens collected. Recommendation:           - Patient has a contact number available for                            emergencies. The signs and symptoms of potential                            delayed complications were discussed with the                            patient. Return to normal activities tomorrow.                            Written discharge instructions were provided to the                            patient.                           - Resume previous diet.                           - Continue present medications. Sandor Flatter, MD 03/06/2024 1:58:59 PM

## 2024-03-06 NOTE — Progress Notes (Signed)
 GASTROENTEROLOGY PROCEDURE H&P NOTE   Primary Care Physician: Joshua Debby CROME, MD    Reason for Procedure:   GERD, Barrett's Esophagus screening, family history of esophageal cancer, history of colon polyps  Plan:    EGD, colonoscopy  Patient is appropriate for endoscopic procedure(s) in the ambulatory ([facility]) setting.  The nature of the procedure, as well as the risks, benefits, and alternatives were carefully and thoroughly reviewed with the patient. Ample time for discussion and questions allowed. The patient understood, was satisfied, and agreed to proceed.     HPI: Betty Bell is a 63 y.o. female who presents for EGD for evaluation of GERD, Barrett's Esophagus screening, and family history of esophageal cancer.  Reflux symptoms largely well-controlled with famotidine  20 mg twice daily.  Brother with esophageal cancer.  Separately, due for colonoscopy for ongoing polyp surveillance.   GI Procedures: Colonoscopy 01/08/2020: recall 3 years - One 5 mm polyp in the descending colon, removed with a cold snare. Resected and retrieved. - Two 1 to 2 mm polyps in the ascending colon and in the cecum, removed with a cold snare. Resected and retrieved. - The examination was otherwise normal on direct and retroflexion views. - 3 year colonoscopy  TUBULAR ADENOMA(S). - NO HIGH GRADE DYSPLASIA OR CARCINOMA   EGD 02/28/2018: - Normal esophagus. - Non-bleeding gastric ulcer with no stigmata of bleeding. - Non-bleeding erosive gastropathy. Biopsied. - Normal examined duodenum. GASTRIC ANTRAL AND OXYNTIC MUCOSA WITH NO SPECIFIC HISTOPATHOLOGIC CHANGES - WARTHIN STARRY STAIN IS NEGATIVE FOR HELICOBACTER PYLORI  Past Medical History:  Diagnosis Date   Anxiety    Asthma    Cervical dysplasia    High Grade   CIN I (cervical intraepithelial neoplasia I)    Colon polyp 2015   TUBULAR ADENOMA   Endometrial polyp    GERD (gastroesophageal reflux disease)    Hydrosalpinx     Left and Right   Hypertension    IBS (irritable bowel syndrome)    Pneumonia    Rectal burning    Seasonal allergies    Uterine fibroid     Past Surgical History:  Procedure Laterality Date   CARPAL TUNNEL RELEASE Left    x 2   COLONOSCOPY  2015   DILATION AND CURETTAGE OF UTERUS     HYSTEROSCOPY     IR BONE MARROW BIOPSY & ASPIRATION  11/30/2023   LASER ABLATION OF THE CERVIX     PELVIC LAPAROSCOPY     DL   UTERINE FIBROID EMBOLIZATION     wrist cystectomy Right     Prior to Admission medications   Medication Sig Start Date End Date Taking? Authorizing Provider  acetaminophen  (TYLENOL ) 500 MG tablet Take 500 mg by mouth every 6 (six) hours as needed.    [provider]  amLODipine  (NORVASC ) 10 MG tablet Take 1 tablet (10 mg total) by mouth daily. 11/17/23   Joshua Debby CROME, MD  Cholecalciferol  50 MCG (2000 UT) TABS Take 1 tablet (2,000 Units total) by mouth daily. 11/04/22   Joshua Debby CROME, MD  famotidine  (PEPCID ) 20 MG tablet Take 1 tablet (20 mg total) by mouth 2 (two) times daily. 01/13/24   May, Deanna J, NP  indapamide  (LOZOL ) 1.25 MG tablet TAKE 1 TABLET(1.25 MG) BY MOUTH DAILY. NEEDS APPOINTMENT FOR REFILLS 12/20/23   Joshua Debby CROME, MD  rosuvastatin  (CRESTOR ) 10 MG tablet TAKE 1 TABLET(10 MG) BY MOUTH DAILY 02/11/24   Joshua Debby CROME, MD    Current Outpatient  Medications  Medication Sig Dispense Refill   acetaminophen  (TYLENOL ) 500 MG tablet Take 500 mg by mouth every 6 (six) hours as needed.     amLODipine  (NORVASC ) 10 MG tablet Take 1 tablet (10 mg total) by mouth daily. 90 tablet 0   Cholecalciferol  50 MCG (2000 UT) TABS Take 1 tablet (2,000 Units total) by mouth daily. 90 tablet 1   famotidine  (PEPCID ) 20 MG tablet Take 1 tablet (20 mg total) by mouth 2 (two) times daily. 60 tablet 6   indapamide  (LOZOL ) 1.25 MG tablet TAKE 1 TABLET(1.25 MG) BY MOUTH DAILY. NEEDS APPOINTMENT FOR REFILLS 30 tablet 1   rosuvastatin  (CRESTOR ) 10 MG tablet TAKE 1 TABLET(10 MG)  BY MOUTH DAILY 90 tablet 0   No current facility-administered medications for this visit.    Allergies as of 03/06/2024 - Review Complete 01/13/2024  Allergen Reaction Noted   Aspirin     Cephalosporins  06/08/2011   Ciprofloxacin  06/08/2011   Fish allergy  08/04/2013   Nitrofurantoin monohyd macro  06/08/2011   Nsaids Other (See Comments) 09/06/2023   Nuzyra  [omadacycline ] Nausea And Vomiting and Other (See Comments) 07/11/2021   Penicillins  06/08/2011   Protonix  [pantoprazole  sodium]  02/28/2014   Sulfa antibiotics  06/08/2011   Sulfur Swelling 12/04/2015    Family History  Problem Relation Age of Onset   Diabetes Mother    Hypertension Mother    Diabetes Father    Hypertension Father    Prostate cancer Father    Colon cancer Father 85   Colon cancer Brother 17   Cancer Brother        esophagus, skin   Esophageal cancer Brother    Colon polyps Sister    Hypertension Sister    Lung cancer Brother    Cancer - Other Brother        esophagal   Kidney disease Other    Irritable bowel syndrome Sister    COPD Neg Hx    Alcohol abuse Neg Hx    Depression Neg Hx    Drug abuse Neg Hx    Early death Neg Hx    Hearing loss Neg Hx    Heart disease Neg Hx    Hyperlipidemia Neg Hx    Stroke Neg Hx    Stomach cancer Neg Hx    Rectal cancer Neg Hx     Social History   Socioeconomic History   Marital status: Divorced    Spouse name: Not on file   Number of children: 1   Years of education: Not on file   Highest education level: Not on file  Occupational History   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  Tobacco Use   Smoking status: Never   Smokeless tobacco: Never   Tobacco comments:    Regular Exercise - Yes  Vaping Use   Vaping status: Never Used  Substance and Sexual Activity   Alcohol use: No    Alcohol/week: 0.0 standard drinks of alcohol   Drug use: No   Sexual activity: Yes    Birth control/protection: Post-menopausal     Comment: 1st intercourse- 16, partners - 3  Other Topics Concern   Not on file  Social History Narrative   Not on file   Social Drivers of Health   Financial Resource Strain: Not on file  Food Insecurity: No Food Insecurity (10/12/2023)   Hunger Vital Sign    Worried About Running Out of Food in the Last Year: Never  true    Ran Out of Food in the Last Year: Never true  Transportation Needs: No Transportation Needs (10/12/2023)   PRAPARE - Administrator, Civil Service (Medical): No    Lack of Transportation (Non-Medical): No  Physical Activity: Not on file  Stress: Not on file  Social Connections: Not on file  Intimate Partner Violence: Not At Risk (10/12/2023)   Humiliation, Afraid, Rape, and Kick questionnaire    Fear of Current or Ex-Partner: No    Emotionally Abused: No    Physically Abused: No    Sexually Abused: No    Physical Exam: Vital signs in last 24 hours: @LMP  03/26/2011  GEN: NAD EYE: Sclerae anicteric ENT: MMM CV: Non-tachycardic Pulm: CTA b/l GI: Soft, NT/ND NEURO:  Alert & Oriented x 3   Sandor Flatter, DO [facility]   03/06/2024 12:27 PM

## 2024-03-07 ENCOUNTER — Telehealth: Payer: Self-pay

## 2024-03-07 NOTE — Telephone Encounter (Signed)
  Follow up Call-     03/06/2024   12:39 PM  Call back number  Post procedure Call Back phone  # [phone]  Permission to leave phone message Yes     Patient questions:  Do you have a fever, pain , or abdominal swelling? No. Pain Score  0 *  Have you tolerated food without any problems? Yes.    Have you been able to return to your normal activities? Yes.    Do you have any questions about your discharge instructions: Diet   No. Medications  No. Follow up visit  No.  Do you have questions or concerns about your Care? No.  Actions: * If pain score is 4 or above: No action needed, pain <4.

## 2024-03-15 ENCOUNTER — Other Ambulatory Visit: Payer: Self-pay | Admitting: Orthopaedic Surgery

## 2024-03-15 ENCOUNTER — Telehealth: Payer: Self-pay

## 2024-03-15 ENCOUNTER — Other Ambulatory Visit: Payer: Self-pay

## 2024-03-15 DIAGNOSIS — Z1211 Encounter for screening for malignant neoplasm of colon: Secondary | ICD-10-CM

## 2024-03-15 NOTE — Telephone Encounter (Signed)
 Patient returned call and was advised of virtual colonoscopy scheduled for 03-27-24.  Patient provided number to [facility] to reschedule appointment as the original appointment was made on her birthday and she would like to reschedule.  Patient agreed to plan and verbalized understanding. No further questions.

## 2024-03-15 NOTE — Telephone Encounter (Signed)
 Left message for patient to return call to further discuss virtual colonoscopy scheduled for 03-27-24 at 10am at [address] Patient should arrive at 9:40am and should pick up kit 4 days prior to procedure. Will continue efforts.

## 2024-03-15 NOTE — Addendum Note (Signed)
 Addended by: BENJAMINE NAT DEL on: 03/15/2024 02:39 PM   Modules accepted: Orders

## 2024-03-22 ENCOUNTER — Encounter: Payer: Self-pay | Admitting: Gastroenterology

## 2024-03-27 ENCOUNTER — Other Ambulatory Visit

## 2024-04-13 ENCOUNTER — Other Ambulatory Visit

## 2024-04-26 ENCOUNTER — Telehealth: Payer: Self-pay

## 2024-04-26 ENCOUNTER — Encounter: Payer: Self-pay | Admitting: Family Medicine

## 2024-04-26 ENCOUNTER — Ambulatory Visit: Payer: Self-pay

## 2024-04-26 ENCOUNTER — Ambulatory Visit (INDEPENDENT_AMBULATORY_CARE_PROVIDER_SITE_OTHER): Admitting: Family Medicine

## 2024-04-26 VITALS — BP 158/94 | HR 77 | Temp 98.3°F | Resp 16 | Ht 65.0 in | Wt 231.0 lb

## 2024-04-26 DIAGNOSIS — I10 Essential (primary) hypertension: Secondary | ICD-10-CM

## 2024-04-26 DIAGNOSIS — B029 Zoster without complications: Secondary | ICD-10-CM | POA: Diagnosis not present

## 2024-04-26 MED ORDER — AMLODIPINE BESYLATE 10 MG PO TABS: 10.0000 mg | ORAL_TABLET | Freq: Every day | ORAL | 0 refills | Status: AC

## 2024-04-26 MED ORDER — LIDOCAINE 5 % EX PTCH: 1.0000 | MEDICATED_PATCH | CUTANEOUS | 0 refills | Status: DC

## 2024-04-26 NOTE — Telephone Encounter (Signed)
 FYI Only or Action Required?: FYI only for provider: appointment scheduled on 04/26/2024 at 4 PM at another location.  Patient was last seen in primary care on 09/06/2023 by Joshua Debby CROME, MD.  Called Nurse Triage reporting Rash.  Symptoms began Friday.  Interventions attempted: OTC medications: hydrocortisone  cream and Rest, hydration, or home remedies.  Symptoms are: unchanged.  Triage Disposition: See Physician Within 24 Hours  Patient/caregiver understands and will follow disposition?: Yes  Copied from CRM #[id number]. Topic: Clinical - Red Word Triage >> Apr 26, 2024  8:09 AM Frederich PARAS wrote: [facility] that prompted transfer to Nurse Triage: pain  rash on 1 side of back, and have back pain, redness, she used cortison becaue it was itching, pain and rash is on right side. Redness and rash started last friday. Reason for Disposition  SEVERE itching (i.e., interferes with sleep, normal activities or school)  Answer Assessment - Initial Assessment Questions Red spots to the right side of back that started on Friday. Endorses pain to area and itching. Requesting to be seen today. Scheduled at another location for 4 PM today.   1. APPEARANCE of RASH: What does the rash look like? (e.g., blisters, dry flaky skin, red spots, redness, sores)     Red spots 2. SIZE: How big are the spots? (e.g., tip of pen, eraser, coin; inches, centimeters)     Small coin 3. LOCATION: Where is the rash located?     Right side of back 4. COLOR: What color is the rash? (Note: It is difficult to assess rash color in people with darker-colored skin. When this situation occurs, simply ask the caller to describe what they see.)     red 5. ONSET: When did the rash begin?     Started on Friday 6. FEVER: Do you have a fever? If Yes, ask: What is your temperature, how was it measured, and when did it start?     no 7. ITCHING: Does the rash itch? If Yes, ask: How bad is the itch? (Scale 1-10; or  mild, moderate, severe)     severe 8. CAUSE: What do you think is causing the rash?     unsure 9. MEDICINE FACTORS: Have you started any new medicines within the last 2 weeks? (e.g., antibiotics)      no 10. OTHER SYMPTOMS: Do you have any other symptoms? (e.g., dizziness, headache, sore throat, joint pain)       Back pain to the same area, headache, nausea  Protocols used: Rash or Redness - [facility]

## 2024-04-26 NOTE — Telephone Encounter (Signed)
 Please Advise ?  Copied from CRM #[id number]. Topic: Clinical - Medication Question >> Apr 26, 2024  4:57 PM Dedra B wrote: Reason for CRM: Pt was seen by Dr. Jordan today at [facility] and originally said that she didn't need a cream for her shingles, but would like for Dr. Jordan to go ahead and send a prescription to  [facility] pharmacy.

## 2024-04-26 NOTE — Progress Notes (Signed)
 ACUTE VISIT Chief Complaint  Patient presents with   Rash    Rash on right side by breast to as far as under arm pit. Rash started Friday. New issue. Has changed fabric softner to see if that was it, states it is kind of going away now.    Discussed the use of AI scribe software for clinical note transcription with the patient, who gave verbal consent to proceed.  History of Present Illness Betty Bell is a 63 year old female with PMHx significant for hypertension, GERD, allergic rhinitis, MGUS, and vitamin D  deficiency, who is here today complaining of  new rash and back pain.  She developed a rash five days ago, located on her back, associated with itching and pain. She suspects it may be related to a recent change in fabric softener. Negative for new medication, insect bites, sick contact, or outdoor exposure.  Prior to the rash, she experienced severe right upper back pain in the same area, initially attributing it to laying in an awkward position. The back pain preceded the rash, which she noticed overnight.  No fever, chills, or oral lesions. She reports some rhinorrhea and postnasal drainage, she has history of seasonal allergies.  For symptom management, she has been using Tylenol  for pain relief and Vaseline for the rash.  She has not received the shingles vaccine.  BP elevated today, she is currently on amlodipine  10 mg daily. She is not monitoring BP at home but he states that it is usually good. Negative for visual changes, CP, dyspnea, palpitations, focal neurologic deficit, or edema. She reports a mild headache today. She states that she has a week of Amlodipine  and no refills left.  Lab Results  Component Value Date   NA 138 09/06/2023   CL 99 09/06/2023   K 3.5 09/06/2023   CO2 26 09/06/2023   BUN 11 09/06/2023   CREATININE 0.70 09/06/2023   GFR 92.03 09/06/2023   CALCIUM  9.5 09/06/2023   ALBUMIN 4.6 09/06/2023   GLUCOSE 100 (H) 09/06/2023    Review of Systems  Constitutional:  Negative for activity change, appetite change and fever.  HENT:  Negative for mouth sores and sore throat.   Respiratory:  Negative for cough and wheezing.   Gastrointestinal:  Negative for abdominal pain, nausea and vomiting.  Genitourinary:  Negative for decreased urine volume and hematuria.  Neurological:  Negative for syncope, facial asymmetry and numbness.  Psychiatric/Behavioral:  Negative for confusion.   See other pertinent positives and negatives in HPI.  Medications Ordered Prior to Encounter[1]  Past Medical History:  Diagnosis Date   Anxiety    Asthma    Cervical dysplasia    High Grade   CIN I (cervical intraepithelial neoplasia I)    Colon polyp 2015   TUBULAR ADENOMA   Endometrial polyp    GERD (gastroesophageal reflux disease)    Hydrosalpinx    Left and Right   Hyperlipidemia    Hypertension    IBS (irritable bowel syndrome)    Pneumonia    Rectal burning    Seasonal allergies    Uterine fibroid    Allergies[2]  Social History   Socioeconomic History   Marital status: Divorced    Spouse name: Not on file   Number of children: 1   Years of education: Not on file   Highest education level: Not on file  Occupational History   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  Tobacco Use  Smoking status: Never   Smokeless tobacco: Never   Tobacco comments:    Regular Exercise - Yes  Vaping Use   Vaping status: Never Used  Substance and Sexual Activity   Alcohol use: No    Alcohol/week: 0.0 standard drinks of alcohol   Drug use: No   Sexual activity: Yes    Birth control/protection: Post-menopausal    Comment: 1st intercourse- 16, partners - 3  Other Topics Concern   Not on file  Social History Narrative   Not on file   Social Drivers of Health   Tobacco Use: Low Risk (04/26/2024)   Patient History    Smoking Tobacco Use: Never    Smokeless Tobacco Use: Never    Passive Exposure: Not  on file  Financial Resource Strain: Not on file  Food Insecurity: No Food Insecurity (10/12/2023)   Hunger Vital Sign    Worried About Running Out of Food in the Last Year: Never true    Ran Out of Food in the Last Year: Never true  Transportation Needs: No Transportation Needs (10/12/2023)   PRAPARE - Administrator, Civil Service (Medical): No    Lack of Transportation (Non-Medical): No  Physical Activity: Not on file  Stress: Not on file  Social Connections: Not on file  Depression (PHQ2-9): Low Risk (10/12/2023)   Depression (PHQ2-9)    PHQ-2 Score: 0  Alcohol Screen: Not on file  Housing: Low Risk (10/12/2023)   Housing Stability Vital Sign    Unable to Pay for Housing in the Last Year: No    Number of Times Moved in the Last Year: 0    Homeless in the Last Year: No  Utilities: Not At Risk (10/12/2023)   AHC Utilities    Threatened with loss of utilities: No  Health Literacy: Not on file    Vitals:   04/26/24 1607  BP: (!) 158/94  Pulse: 77  Resp: 16  Temp: 98.3 F (36.8 C)  SpO2: 97%   Body mass index is 38.44 kg/m.  Physical Exam Vitals and nursing note reviewed.  Constitutional:      General: She is not in acute distress.    Appearance: She is well-developed.  HENT:     Head: Normocephalic and atraumatic.  Eyes:     Conjunctiva/sclera: Conjunctivae normal.  Cardiovascular:     Rate and Rhythm: Normal rate and regular rhythm.     Heart sounds: No murmur heard. Pulmonary:     Effort: Pulmonary effort is normal. No respiratory distress.     Breath sounds: Normal breath sounds.  Lymphadenopathy:     Cervical: No cervical adenopathy.  Skin:    General: Skin is warm.     Findings: Rash present. Rash is papular and vesicular.         Comments: Confluent , mildly erythematous rash with dermatomal distribution. One cluster of vesicles but rest have central crusts. Not tender. See picture.  Neurological:     General: No focal deficit present.     Mental  Status: She is alert and oriented to person, place, and time.     Gait: Gait normal.  Psychiatric:        Mood and Affect: Mood and affect normal.     ASSESSMENT AND PLAN:  [name] was seen today for rash.  Diagnoses and all orders for this visit:  Herpes zoster without complication Discussed diagnosis, prognosis, and treatment options. Problem started about a 5 days ago, so there is no  indication for antiviral therapy. Recommend Lidoderm  patches to help with neuropathic pain. Follow-up with PCP as needed.  -     lidocaine  (LIDODERM ) 5 %; Place 1 patch onto the skin daily. Remove & Discard patch within 12 hours or as directed by MD  Essential hypertension BP elevated today. We discussed possible complications of elevated BP. She is not monitoring BP at home, recommend doing so. She needs a refill for her amlodipine , continue Amlodipine  10 mg daily and low-salt diet. Instructed to arrange appointment with PCP for follow-up.  -     amLODipine  (NORVASC ) 10 MG tablet; Take 1 tablet (10 mg total) by mouth daily.  Return if symptoms worsen or fail to improve.  Paighton Godette G. Merdith Boyd, MD  [facility]. [place] office.     [1]  Current Outpatient Medications on File Prior to Visit  Medication Sig Dispense Refill   acetaminophen  (TYLENOL ) 500 MG tablet Take 500 mg by mouth every 6 (six) hours as needed.     cetirizine (ZYRTEC) 10 MG tablet Take 10 mg by mouth daily.     Cholecalciferol  50 MCG (2000 UT) TABS Take 1 tablet (2,000 Units total) by mouth daily. 90 tablet 1   famotidine  (PEPCID ) 20 MG tablet Take 1 tablet (20 mg total) by mouth 2 (two) times daily. 60 tablet 6   indapamide  (LOZOL ) 1.25 MG tablet TAKE 1 TABLET(1.25 MG) BY MOUTH DAILY. NEEDS APPOINTMENT FOR REFILLS 30 tablet 1   rosuvastatin  (CRESTOR ) 10 MG tablet TAKE 1 TABLET(10 MG) BY MOUTH DAILY 90 tablet 0   No current facility-administered medications on file prior to visit.  [2]  Allergies Allergen  Reactions   Aspirin Swelling    REACTION: Swelling   Fish Allergy Shortness Of Breath    Shortness of breath   Protonix  [Pantoprazole  Sodium] Shortness Of Breath    Respiratory distress   Sulfur Shortness Of Breath    SHORTNESS OF BREATH ALSO   Cephalosporins Nausea Only    Stomach cramps   Ciprofloxacin Nausea Only    Stomach cramps   Nitrofurantoin Monohyd Macro Nausea Only    Stomach cramps   Nsaids Nausea Only    Stomach cramps   Nuzyra  [Omadacycline ] Nausea And Vomiting and Other (See Comments)    Constipation    Penicillins Nausea Only    Per pt: unknown   Sulfa Antibiotics Nausea Only    Stomach cramps

## 2024-04-26 NOTE — Telephone Encounter (Signed)
 Patient informed . Patient is wanting to know if there is anything OTC she can get to help, Please Advise ?

## 2024-04-26 NOTE — Patient Instructions (Addendum)
 A few things to remember from today's visit:  Essential hypertension - Plan: amLODipine  (NORVASC ) 10 MG tablet  Herpes zoster without complication - Plan: lidocaine  (LIDODERM ) 5 % Apply patch on affected area.  Monitor blood pressure at home. Goal 130/80 or less. Arrange follow up appt with pcp.  If you need refills for medications you take chronically, please call your pharmacy. Do not use My Chart to request refills or for acute issues that need immediate attention. If you send a my chart message, it may take a few days to be addressed, specially if I am not in the office.  Please be sure medication list is accurate. If a new problem present, please set up appointment sooner than planned today.

## 2024-04-26 NOTE — Telephone Encounter (Signed)
 I prescribed lidocaine  patch instead cream because insurance coverage. Thanks, BJ

## 2024-04-28 ENCOUNTER — Telehealth: Payer: Self-pay

## 2024-04-28 ENCOUNTER — Other Ambulatory Visit (HOSPITAL_COMMUNITY): Payer: Self-pay

## 2024-04-28 NOTE — Telephone Encounter (Signed)
 Pharmacy Patient Advocate Encounter   Received notification from Onbase that prior authorization for Lidocaine  5 % patches is required/requested.   Insurance verification completed.   The patient is insured through [facility].   Per test claim: PA required; PA submitted to above mentioned insurance via Latent Key/confirmation #/EOC [id number] Status is pending

## 2024-04-28 NOTE — Telephone Encounter (Signed)
 Spoke to the patient in regards of the lidocaine  patches - Patient stated if the medication can not be approved within a week then she wont need it by then. FYI

## 2024-04-28 NOTE — Telephone Encounter (Signed)
 Patient was informed of your message. Voiced understanding. No further questions.

## 2024-04-28 NOTE — Telephone Encounter (Signed)
 She can try topical Asper cream with Lidocaine  or icy hot. Thanks, BJ

## 2024-04-30 ENCOUNTER — Ambulatory Visit
Admission: EM | Admit: 2024-04-30 | Discharge: 2024-04-30 | Disposition: A | Discharge status: Home / Self Care | Attending: Family Medicine | Admitting: Family Medicine

## 2024-04-30 DIAGNOSIS — J988 Other specified respiratory disorders: Secondary | ICD-10-CM

## 2024-04-30 DIAGNOSIS — J4520 Mild intermittent asthma, uncomplicated: Secondary | ICD-10-CM

## 2024-04-30 DIAGNOSIS — B9789 Other viral agents as the cause of diseases classified elsewhere: Secondary | ICD-10-CM

## 2024-04-30 DIAGNOSIS — J309 Allergic rhinitis, unspecified: Secondary | ICD-10-CM | POA: Diagnosis not present

## 2024-04-30 MED ORDER — PREDNISONE 20 MG PO TABS: ORAL_TABLET | ORAL | 0 refills | Status: DC

## 2024-04-30 MED ORDER — PROMETHAZINE-DM 6.25-15 MG/5ML PO SYRP: 5.0000 mL | ORAL_SOLUTION | Freq: Three times a day (TID) | ORAL | 0 refills | Status: DC | PRN

## 2024-04-30 NOTE — ED Triage Notes (Signed)
 Pt c/o cough, nasal, and chest congestion for 4 days. Pt denies any fever, body aches, or sore throat. Pt has been using a saline solution for nose. Pt denies taking any PO meds for symptoms. NAD.

## 2024-04-30 NOTE — ED Provider Notes (Signed)
 " [facility] - [facility]  Note:  This document was prepared using Conservation officer, historic buildings and may include unintentional dictation errors.  MRN: [id number] DOB: January 13, 1961  Subjective:   Betty Bell is a 63 y.o. female presenting for 4-day history of sinus congestion, coughing, drainage and chest congestion. No chest pain, shob, wheezing. Has a history of asthma, does not use an inhaler currently. Last use was ~4 years ago. No smoking of any kind including cigarettes, cigars, vaping, marijuana use.  Has allergic rhinitis, takes Zyrtec daily.   Current Outpatient Medications  Medication Instructions   acetaminophen  (TYLENOL ) 500 mg, Every 6 hours PRN   amLODipine  (NORVASC ) 10 mg, Oral, Daily   cetirizine (ZYRTEC) 10 mg, Daily   Cholecalciferol  2,000 Units, Oral, Daily   famotidine  (PEPCID ) 20 mg, Oral, 2 times daily   indapamide  (LOZOL ) 1.25 MG tablet TAKE 1 TABLET(1.25 MG) BY MOUTH DAILY. NEEDS APPOINTMENT FOR REFILLS   lidocaine  (LIDODERM ) 5 % 1 patch, Transdermal, Every 24 hours, Remove & Discard patch within 12 hours or as directed by MD   rosuvastatin  (CRESTOR ) 10 MG tablet TAKE 1 TABLET(10 MG) BY MOUTH DAILY    Allergies[1]  Past Medical History:  Diagnosis Date   Anxiety    Asthma    Cervical dysplasia    High Grade   CIN I (cervical intraepithelial neoplasia I)    Colon polyp 2015   TUBULAR ADENOMA   Endometrial polyp    GERD (gastroesophageal reflux disease)    Hydrosalpinx    Left and Right   Hyperlipidemia    Hypertension    IBS (irritable bowel syndrome)    Pneumonia    Rectal burning    Seasonal allergies    Uterine fibroid      Past Surgical History:  Procedure Laterality Date   CARPAL TUNNEL RELEASE Left    x 2   COLONOSCOPY  05/11/2013   DILATION AND CURETTAGE OF UTERUS     HYSTEROSCOPY     IR BONE MARROW BIOPSY & ASPIRATION  11/30/2023   LASER ABLATION OF THE CERVIX     PELVIC LAPAROSCOPY     DL   UPPER  GASTROINTESTINAL ENDOSCOPY     UTERINE FIBROID EMBOLIZATION     wrist cystectomy Right     Family History  Problem Relation Age of Onset   Diabetes Mother    Hypertension Mother    Diabetes Father    Hypertension Father    Prostate cancer Father    Colon cancer Father 71   Colon polyps Sister    Hypertension Sister    Irritable bowel syndrome Sister    Colon cancer Brother 18   Cancer Brother        esophagus, skin   Esophageal cancer Brother    Lung cancer Brother    Cancer - Other Brother        esophagal   Kidney disease Other    COPD Neg Hx    Alcohol abuse Neg Hx    Depression Neg Hx    Drug abuse Neg Hx    Early death Neg Hx    Hearing loss Neg Hx    Heart disease Neg Hx    Hyperlipidemia Neg Hx    Stroke Neg Hx    Stomach cancer Neg Hx    Rectal cancer Neg Hx     Social History   Occupational History   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 [employer]  Tobacco Use  Smoking status: Never   Smokeless tobacco: Never   Tobacco comments:    Regular Exercise - Yes  Vaping Use   Vaping status: Never Used  Substance and Sexual Activity   Alcohol use: No    Alcohol/week: 0.0 standard drinks of alcohol   Drug use: No   Sexual activity: Yes    Birth control/protection: Post-menopausal    Comment: 1st intercourse- 16, partners - 3     ROS   Objective:   Vitals: BP (!) 151/82 (BP Location: Right Arm)   Pulse 75   Temp 97.9 F (36.6 C) (Oral)   Resp 18   LMP 03/26/2011   SpO2 96%   Physical Exam Constitutional:      General: She is not in acute distress.    Appearance: Normal appearance. She is well-developed and normal weight. She is not ill-appearing, toxic-appearing or diaphoretic.  HENT:     Head: Normocephalic and atraumatic.     Right Ear: Tympanic membrane, ear canal and external ear normal. No drainage or tenderness. No middle ear effusion. There is no impacted cerumen. Tympanic membrane is not erythematous or bulging.      Left Ear: Tympanic membrane, ear canal and external ear normal. No drainage or tenderness.  No middle ear effusion. There is no impacted cerumen. Tympanic membrane is not erythematous or bulging.     Nose: Congestion present. No rhinorrhea.     Comments: Nasal mucosa boggy and edematous.    Mouth/Throat:     Mouth: Mucous membranes are moist. No oral lesions.     Pharynx: No pharyngeal swelling, oropharyngeal exudate, posterior oropharyngeal erythema or uvula swelling.     Tonsils: No tonsillar exudate or tonsillar abscesses.  Eyes:     General: No scleral icterus.       Right eye: No discharge.        Left eye: No discharge.     Extraocular Movements: Extraocular movements intact.     Right eye: Normal extraocular motion.     Left eye: Normal extraocular motion.     Conjunctiva/sclera: Conjunctivae normal.  Cardiovascular:     Rate and Rhythm: Normal rate and regular rhythm.     Heart sounds: Normal heart sounds. No murmur heard.    No friction rub. No gallop.  Pulmonary:     Effort: Pulmonary effort is normal. No respiratory distress.     Breath sounds: No stridor. No wheezing, rhonchi or rales.  Chest:     Chest wall: No tenderness.  Musculoskeletal:     Cervical back: Normal range of motion and neck supple.  Lymphadenopathy:     Cervical: No cervical adenopathy.  Skin:    General: Skin is warm and dry.  Neurological:     General: No focal deficit present.     Mental Status: She is alert and oriented to person, place, and time.  Psychiatric:        Mood and Affect: Mood normal.        Behavior: Behavior normal.     Assessment and Plan :   PDMP not reviewed this encounter.  1. Viral respiratory infection   2. Allergic rhinitis, unspecified seasonality, unspecified trigger   3. Mild intermittent asthma, uncomplicated      Likely has a viral URI worsened by her underlying asthma and allergies.  Offered an oral prednisone  course.  Recommend supportive care.  Deferred  imaging given clear cardiopulmonary exam, hemodynamically stable vital signs.  Counseled patient on potential for adverse effects with  medications prescribed/recommended today, ER and return-to-clinic precautions discussed, patient verbalized understanding.     [1]  Allergies Allergen Reactions   Aspirin Swelling    REACTION: Swelling   Fish Allergy Shortness Of Breath    Shortness of breath   Protonix  [Pantoprazole  Sodium] Shortness Of Breath    Respiratory distress   Sulfur Shortness Of Breath    SHORTNESS OF BREATH ALSO   Cephalosporins Nausea Only    Stomach cramps   Ciprofloxacin Nausea Only    Stomach cramps   Nitrofurantoin Monohyd Macro Nausea Only    Stomach cramps   Nsaids Nausea Only    Stomach cramps   Nuzyra  [Omadacycline ] Nausea And Vomiting and Other (See Comments)    Constipation    Penicillins Nausea Only    Per pt: unknown   Sulfa Antibiotics Nausea Only    Stomach cramps     Christopher Savannah, [place] 05/03/24 [id number]  "

## 2024-04-30 NOTE — Discharge Instructions (Signed)
 We will manage this as a viral respiratory infection. For sore throat or cough try using a honey-based tea. Use 3 teaspoons of honey with juice squeezed from half lemon. Place shaved pieces of ginger into 1/2-1 cup of water and warm over stove top. Then mix the ingredients and repeat every 4 hours as needed. Please take Tylenol  500mg -650mg  once every 6 hours for fevers, aches and pains. Hydrate very well with at least 2 liters (64 ounces) of water. Eat light meals such as soups (chicken and noodles, chicken wild rice, vegetable).  Do not eat any foods that you are allergic to.  Keep taking Zyrtec (10mg  daily) for postnasal drainage, sinus congestion.  You can take this together with prednisone  to help with your underlying allergies, asthma and current respiratory symptoms. Use cough syrup as needed.

## 2024-05-16 ENCOUNTER — Telehealth: Payer: Self-pay | Admitting: Gastroenterology

## 2024-05-16 NOTE — Telephone Encounter (Signed)
 PT is calling to have a prior authorization sent in for famotidine . She also needs to speak to a nurse regarding the appointment on 1/12. Please advise.

## 2024-05-17 NOTE — Telephone Encounter (Signed)
 Patient called with concerns regarding virtual colonoscopy. Primarily worried if insurance would cover it and subsequent colonoscopy if needed to remove any polyps. Advised she should contact her insurance company with these questions. Patient wanting to know if there is any other option, including just doing another colonoscopy and skipping the virtual.   Explained due to her anatomy, that was why the virtual was recommend.

## 2024-05-17 NOTE — Telephone Encounter (Signed)
 Patient states pharmacy advised she had to have prior auth to have famotine filled

## 2024-05-18 ENCOUNTER — Other Ambulatory Visit (HOSPITAL_COMMUNITY): Payer: Self-pay

## 2024-05-18 ENCOUNTER — Telehealth: Payer: Self-pay

## 2024-05-18 NOTE — Telephone Encounter (Signed)
 Pharmacy Patient Advocate Encounter   Received notification from Pt Calls Messages that prior authorization for Famotidine  20MG  tablets is required/requested.   Insurance verification completed.   The patient is insured through [facility].   Per test claim: PA required; PA submitted to above mentioned insurance via Latent Key/confirmation #/EOC [id number] Status is pending

## 2024-05-19 NOTE — Telephone Encounter (Signed)
 Spoke with patient.  She was told by insurance they would NOT cover another colonoscopy but would cover partial cost of virtual colonoscopy. Patient wishes to cancel virtual for now as she saves money to cover it's cost.  Patient will call back when she is ready to reschedule.  advised patient that PA has been submitted to her insurance for Pepcid .    [facility] contacted and CT canceled.  Patient denied  further questions or concerns.

## 2024-05-19 NOTE — Telephone Encounter (Signed)
 Patient is requesting a call to discuss previous note further. Would like to be advised if she should continue. Please advise, thank you

## 2024-05-22 ENCOUNTER — Other Ambulatory Visit (HOSPITAL_COMMUNITY): Payer: Self-pay

## 2024-05-22 ENCOUNTER — Ambulatory Visit

## 2024-05-22 ENCOUNTER — Other Ambulatory Visit: Payer: Self-pay

## 2024-05-22 DIAGNOSIS — D472 Monoclonal gammopathy: Secondary | ICD-10-CM

## 2024-05-22 NOTE — Telephone Encounter (Signed)
 Pharmacy Patient Advocate Encounter  Received notification from [employer] that Prior Authorization for Famotidine  20MG  tablets has been APPROVED from 05-18-2024 to 05-18-2025. Ran test claim, Copay is $8.57 for 30 day supply. This test claim was processed through [facility]- copay amounts may vary at other pharmacies due to pharmacy/plan contracts, or as the patient moves through the different stages of their insurance plan.   PA #/Case ID/Reference #: [id number]

## 2024-05-22 NOTE — Telephone Encounter (Signed)
 Pt made aware of the PA approval. Pt verbalized understanding with all questions answered.

## 2024-05-23 ENCOUNTER — Other Ambulatory Visit: Payer: Self-pay | Admitting: Nurse Practitioner

## 2024-05-23 ENCOUNTER — Inpatient Hospital Stay: Attending: Hematology

## 2024-05-23 DIAGNOSIS — D472 Monoclonal gammopathy: Secondary | ICD-10-CM

## 2024-05-23 DIAGNOSIS — R778 Other specified abnormalities of plasma proteins: Secondary | ICD-10-CM

## 2024-06-06 ENCOUNTER — Telehealth: Admitting: Hematology

## 2024-06-06 NOTE — Assessment & Plan Note (Signed)
 IgA lambda type  -Discovered on lab, M protein 0.5, immunofixation showed IgA lambda type, serum lambda light chain 113, no anemia, hypercalcemia, or renal insufficiency.  Bone survey was negative. - Bone marrow biopsy in July 2025 showed Lambda restricted plasma cell neoplasm involving approximately 5 to  10%  -Continue surveillance.

## 2024-06-07 ENCOUNTER — Inpatient Hospital Stay: Admitting: Hematology

## 2024-06-08 ENCOUNTER — Ambulatory Visit: Payer: Self-pay

## 2024-06-08 NOTE — Telephone Encounter (Signed)
 FYI Only or Action Required?: Action required by provider: clinical question for provider and update on patient condition.  Patient was last seen in primary care on 04/26/2024 by Jordan, Betty G, MD.  Called Nurse Triage reporting Sinusitis.  Symptoms began a week ago.  Interventions attempted: OTC medications: tylenol  cold and sinus; zyrtec; nasal mist and spray.  Symptoms are: unchanged.  Triage Disposition: See HCP Within 4 Hours (Or PCP Triage)  Patient/caregiver understands and will follow disposition?: No, wishes to speak with PCP   Message from Antwanette L sent at 06/08/2024 12:03 PM EST  Reason for Triage: The patient reports a possible sinus infection with headache and congestion. The patient is also experiencing facial pain around the nose and eyes. The patient is requesting medication   Reason for Disposition  [1] SEVERE sinus pain (e.g., excruciating) AND [2] not improved 2 hours after pain medicine  Answer Assessment - Initial Assessment Questions Pt called and requested abx for suspected sinus infection. Pt states she has been tx symptoms at home with tylenol  cold and sinus, zyrtec and nasal mist/spray without relief of symptoms. Pt states sinus pressure at this time is 9/10. Discussed virtual visit with provider vs. Virtual UC but pt prefers PCP to send abx to pharmacy. She states if PCP prefers to see her, then she will schedule. Pt is available via phone and explained visit would be through my chart; pt does have access to my chart. Please advise.     1. LOCATION: Where does it hurt?      Facial pain, h/a  2. ONSET: When did the sinus pain start?  (e.g., hours, days)      Friday, 01/23  3. SEVERITY: How bad is the pain?   (Scale 0-10; or none, mild, moderate or severe)     9/10  4. RECURRENT SYMPTOM: Have you ever had sinus problems before? If Yes, ask: When was the last time? and What happened that time?      Yes; pt states in the past she has used  otcs and for worsening symptoms, abx   5. NASAL CONGESTION: Is the nose blocked? If Yes, ask: Can you open it or must you breathe through your mouth?     Yes, pt reports she is congested and mucous is settled, trying to use nasal sprays and mists to aid in relieving congestion   6. NASAL DISCHARGE: Do you have discharge from your nose? If so ask, What color?     No   7. FEVER: Do you have a fever? If Yes, ask: What is it, how was it measured, and when did it start?      No   8. OTHER SYMPTOMS: Do you have any other symptoms? (e.g., sore throat, cough, earache, difficulty breathing)     H/a  Protocols used: Sinus Pain or Congestion-A-AH

## 2024-06-08 NOTE — Telephone Encounter (Signed)
 Copied from CRM #[id number]. Topic: General - Call Back - No Documentation >> Jun 08, 2024  3:05 PM Rea C wrote: Reason for CRM: Pt is awaiting a phone call from clinic. She spoke with NT earlier today and said that she has not heard back from the clinic yet.    [phone] (M)

## 2024-06-09 ENCOUNTER — Telehealth: Admitting: Nurse Practitioner

## 2024-06-09 DIAGNOSIS — J329 Chronic sinusitis, unspecified: Secondary | ICD-10-CM

## 2024-06-09 DIAGNOSIS — J40 Bronchitis, not specified as acute or chronic: Secondary | ICD-10-CM

## 2024-06-09 MED ORDER — PREDNISONE 20 MG PO TABS: 40.0000 mg | ORAL_TABLET | Freq: Every day | ORAL | 0 refills | Status: AC

## 2024-06-09 MED ORDER — FLUTICASONE PROPIONATE 50 MCG/ACT NA SUSP: 2.0000 | Freq: Every day | NASAL | 0 refills | Status: AC

## 2024-06-09 MED ORDER — AZITHROMYCIN 250 MG PO TABS: ORAL_TABLET | ORAL | 0 refills | Status: AC

## 2024-06-09 MED ORDER — PROMETHAZINE-DM 6.25-15 MG/5ML PO SYRP: 5.0000 mL | ORAL_SOLUTION | Freq: Four times a day (QID) | ORAL | 0 refills | Status: AC | PRN

## 2024-06-09 NOTE — Patient Instructions (Addendum)
 " Betty Bell, thank you for joining [facility] Betty Rouleau, NP for today's virtual visit.  While this provider is not your primary care provider (PCP), if your PCP is located in our provider database this encounter information will be shared with them immediately following your visit.   A [facility] MyChart account gives you access to today's visit and all your visits, tests, and labs performed at [facility]  click here if you don't have a [facility] MyChart account or go to [email or web address]  Consent: (Patient) Betty Bell provided verbal consent for this virtual visit at the beginning of the encounter.  Current Medications:  Current Outpatient Medications:    acetaminophen  (TYLENOL ) 500 MG tablet, Take 500 mg by mouth every 6 (six) hours as needed., Disp: , Rfl:    amLODipine  (NORVASC ) 10 MG tablet, Take 1 tablet (10 mg total) by mouth daily., Disp: 90 tablet, Rfl: 0   azithromycin  (ZITHROMAX ) 250 MG tablet, Take 2 tabs on day one then 1 tab daily for four additional days, Disp: 6 tablet, Rfl: 0   cetirizine (ZYRTEC) 10 MG tablet, Take 10 mg by mouth daily., Disp: , Rfl:    Cholecalciferol  50 MCG (2000 UT) TABS, Take 1 tablet (2,000 Units total) by mouth daily., Disp: 90 tablet, Rfl: 1   famotidine  (PEPCID ) 20 MG tablet, Take 1 tablet (20 mg total) by mouth 2 (two) times daily., Disp: 60 tablet, Rfl: 6   fluticasone  (FLONASE ) 50 MCG/ACT nasal spray, Place 2 sprays into both nostrils daily., Disp: 18.2 mL, Rfl: 0   indapamide  (LOZOL ) 1.25 MG tablet, TAKE 1 TABLET(1.25 MG) BY MOUTH DAILY. NEEDS APPOINTMENT FOR REFILLS, Disp: 30 tablet, Rfl: 1   predniSONE  (DELTASONE ) 20 MG tablet, Take 2 tablets (40 mg total) by mouth daily with breakfast for 5 days., Disp: 10 tablet, Rfl: 0   promethazine -dextromethorphan (PROMETHAZINE -DM) 6.25-15 MG/5ML syrup, Take 5 mLs by mouth 4 (four) times daily as needed for cough., Disp: 118 mL, Rfl: 0   rosuvastatin  (CRESTOR ) 10 MG  tablet, TAKE 1 TABLET(10 MG) BY MOUTH DAILY, Disp: 90 tablet, Rfl: 0   Medications ordered in this encounter:  Meds ordered this encounter  Medications   azithromycin  (ZITHROMAX ) 250 MG tablet    Sig: Take 2 tabs on day one then 1 tab daily for four additional days    Dispense:  6 tablet    Refill:  0   predniSONE  (DELTASONE ) 20 MG tablet    Sig: Take 2 tablets (40 mg total) by mouth daily with breakfast for 5 days.    Dispense:  10 tablet    Refill:  0   fluticasone  (FLONASE ) 50 MCG/ACT nasal spray    Sig: Place 2 sprays into both nostrils daily.    Dispense:  18.2 mL    Refill:  0   promethazine -dextromethorphan (PROMETHAZINE -DM) 6.25-15 MG/5ML syrup    Sig: Take 5 mLs by mouth 4 (four) times daily as needed for cough.    Dispense:  118 mL    Refill:  0     *If you need refills on other medications prior to your next appointment, please contact your pharmacy*  Follow-Up: Call back or seek an in-person evaluation if the symptoms worsen or if the condition fails to improve as anticipated.   Other Instructions Based on what you have shared with me it looks like you have sinusitis as well as bronchitis.  Sinusitis is inflammation and infection in the sinus cavities of the head.  Based on  your presentation I believe you most likely have Acute Bacterial Sinusitis.  This is an infection caused by bacteria and is treated with antibiotics. You have been prescribed an antibiotic, a cough suppressant, and flonase  nasal spray to help calm down the cough/manage the sinus symptoms. You have also been prescribed prednisone  for the cough. Take this in the morning with food. I also recommend taking Mucinex  over the counter. Saline nasal irrigations/rinses 1-2 days are also beneficial to help treat the sinus symptoms; use bottled distilled water with these.  You can continue over the counter ibuprofen  or acetaminophen  as needed for the headaches  Please take your antibiotic in it's entirety, and do  not stop it just because you are feeling better. Take it with food.  If you develop worsening sinus pain, fever or notice severe headache and vision changes, or if symptoms are not better after completion of antibiotic, please schedule an appointment with a health care provider.    Sinus infections are not as easily transmitted as other respiratory infection, however we still recommend that you avoid close contact with loved ones, especially the very young and elderly.  Remember to wash your hands thoroughly throughout the day as this is the number one way to prevent the spread of infection!  Home Care: Only take medications as instructed by your medical team. Complete the entire course of an antibiotic. Do not take these medications with alcohol. A steam or ultrasonic humidifier can help congestion.  You can place a towel over your head and breathe in the steam from hot water coming from a faucet. Avoid close contacts especially the very young and the elderly. Cover your mouth when you cough or sneeze. Always remember to wash your hands.  Get Help Right Away If: You develop worsening fever or sinus pain. You develop a severe head ache or visual changes. Your symptoms persist after you have completed your treatment plan.      If you have been instructed to have an in-person evaluation today at a local Urgent Care facility, please use the link below. It will take you to a list of all of our available [facility], including address, phone number and hours of operation. Please do not delay care.  [facility]  If you or a family member do not have a primary care provider, use the link below to schedule a visit and establish care. When you choose a [facility] primary care physician or advanced practice provider, you gain a long-term partner in health. Find a Primary Care Provider  Learn more about [facility] in-office and virtual care options:  - Get  Care Now  "

## 2024-06-09 NOTE — Telephone Encounter (Signed)
 Called and spoke with patient. Informed her that without an appointment we would be unable to provide a prescription for an antibiotic. Advised her of virtual appointments through the office as well as virtual urgent care through MyChart. Patient stated she would reach out to her insurance and see how much these would be and then make a decision

## 2024-06-09 NOTE — Progress Notes (Signed)
 " Virtual Visit Consent   Betty Bell, you are scheduled for a virtual visit with a [facility] provider today. Just as with appointments in the office, your consent must be obtained to participate. Your consent will be active for this visit and any virtual visit you may have with one of our providers in the next 365 days. If you have a MyChart account, a copy of this consent can be sent to you electronically.  As this is a virtual visit, video technology does not allow for your provider to perform a traditional examination. This may limit your provider's ability to fully assess your condition. If your provider identifies any concerns that need to be evaluated in person or the need to arrange testing (such as labs, EKG, etc.), we will make arrangements to do so. Although advances in technology are sophisticated, we cannot ensure that it will always work on either your end or our end. If the connection with a video visit is poor, the visit may have to be switched to a telephone visit. With either a video or telephone visit, we are not always able to ensure that we have a secure connection.  By engaging in this virtual visit, you consent to the provision of healthcare and authorize for your insurance to be billed (if applicable) for the services provided during this visit. Depending on your insurance coverage, you may receive a charge related to this service.  I need to obtain your verbal consent now. Are you willing to proceed with your visit today? Betty Bell has provided verbal consent on 06/09/2024 for a virtual visit (video or telephone). Betty LULLA Rouleau, NP  Date: 06/09/2024 11:10 AM   Virtual Visit via Video Note   I, Betty Bell, connected with  Betty Bell  ([id number], [id number]) on 06/09/24 at 10:45 AM EST by a video-enabled telemedicine application and verified that I am speaking with the correct person using two identifiers.  Location: Patient: Virtual  Visit Location Patient: Home Provider: Virtual Visit Location Provider: Home Office   I discussed the limitations of evaluation and management by telemedicine and the availability of in person appointments. The patient expressed understanding and agreed to proceed.    History of Present Illness: Betty Bell is a 64 y.o. who identifies as a female who was assigned female at birth, and is being seen today for sinus symptoms.  Going on for the last week and feels they are getting worse  Sinus congestion with thick drainage but still clear Has a lot of postnasal drainage  Facial pain/pressure around her eyes and nose  Having a headache as well  Dry cough  No fevers, sore throat, ear pain, chest tightness, SOB, wheezing, CP  Has been diagnosed with asthma but never been on any maintenance or rescue inhalers  Eating and drinking normally  Difficulties with multiple antibiotics. Able to tolerate z pack. Allergies listed to tetracyclines, PCNs, cephalosporins, fluoroquinolone   She did have a URI end of December and received prednisone  for a cough. No abx were given. Symptoms resolved from this episode.   HPI: HPI  Problems:  Patient Active Problem List   Diagnosis Date Noted   IgA monoclonal gammopathy of undetermined significance (MGUS) 11/03/2023   Abnormal SPEP 09/09/2023   Dyslipidemia, goal LDL below 100 11/04/2022   Elevated total protein 11/04/2022   Morbid obesity (HCC) 11/03/2022   Encounter for general adult medical examination with abnormal findings 11/03/2022   Primary osteoarthritis of both knees  01/29/2022   Hx of adenomatous polyp of colon 12/19/2019   Cervical cancer screening 03/20/2019   Vitamin D  deficiency disease 01/19/2019   Essential hypertension 07/13/2014   Intramural leiomyoma of uterus 06/19/2014   Visit for screening mammogram 11/16/2013   Allergic rhinosinusitis    GERD 01/26/2008    Allergies: Allergies[1] Medications: Current  Medications[2]  Observations/Objective: Patient is well-developed, well-nourished in no acute distress.  Resting comfortably at home.  Head is normocephalic, atraumatic.  No labored breathing. Able to speak in complete sentences without difficulties.  Bronchitic cough Speech is clear and coherent with logical content.  Patient is alert and oriented at baseline.    Assessment and Plan: 1. Sinobronchitis (Primary) - azithromycin  (ZITHROMAX ) 250 MG tablet; Take 2 tabs on day one then 1 tab daily for four additional days  Dispense: 6 tablet; Refill: 0 - predniSONE  (DELTASONE ) 20 MG tablet; Take 2 tablets (40 mg total) by mouth daily with breakfast for 5 days.  Dispense: 10 tablet; Refill: 0 - fluticasone  (FLONASE ) 50 MCG/ACT nasal spray; Place 2 sprays into both nostrils daily.  Dispense: 18.2 mL; Refill: 0 - promethazine -dextromethorphan (PROMETHAZINE -DM) 6.25-15 MG/5ML syrup; Take 5 mLs by mouth 4 (four) times daily as needed for cough.  Dispense: 118 mL; Refill: 0  Given length of symptoms and failure to improve, will treat for bacterial coinfection with empiric abx course. Multiple allergies listed, which were verified with pt. Given limitations, azithromycin  prescribed. Supportive care encouraged. Rx for prednisone  sent for management of bronchitis symptoms. Reviewed red flag symptoms. Advised that if symptoms fail to improve or worsen, she will need in person evaluation. Verbalized understanding.   Follow Up Instructions: I discussed the assessment and treatment plan with the patient. The patient was provided an opportunity to ask questions and all were answered. The patient agreed with the plan and demonstrated an understanding of the instructions.  A copy of instructions were sent to the patient via MyChart unless otherwise noted below.    The patient was advised to call back or seek an in-person evaluation if the symptoms worsen or if the condition fails to improve as anticipated.     Betty LULLA Rouleau, NP     [1]  Allergies Allergen Reactions   Aspirin Swelling    REACTION: Swelling   Fish Allergy Shortness Of Breath    Shortness of breath   Protonix  [Pantoprazole  Sodium] Shortness Of Breath    Respiratory distress   Sulfur Shortness Of Breath    SHORTNESS OF BREATH ALSO   Cephalosporins Nausea Only    Stomach cramps   Ciprofloxacin Nausea Only    Stomach cramps   Nitrofurantoin Monohyd Macro Nausea Only    Stomach cramps   Nsaids Nausea Only    Stomach cramps   Nuzyra  [Omadacycline ] Nausea And Vomiting and Other (See Comments)    Constipation    Penicillins Nausea Only    Per pt: unknown   Sulfa Antibiotics Nausea Only    Stomach cramps  [2]  Current Outpatient Medications:    acetaminophen  (TYLENOL ) 500 MG tablet, Take 500 mg by mouth every 6 (six) hours as needed., Disp: , Rfl:    amLODipine  (NORVASC ) 10 MG tablet, Take 1 tablet (10 mg total) by mouth daily., Disp: 90 tablet, Rfl: 0   azithromycin  (ZITHROMAX ) 250 MG tablet, Take 2 tabs on day one then 1 tab daily for four additional days, Disp: 6 tablet, Rfl: 0   cetirizine (ZYRTEC) 10 MG tablet, Take 10 mg by mouth daily., Disp: ,  Rfl:    Cholecalciferol  50 MCG (2000 UT) TABS, Take 1 tablet (2,000 Units total) by mouth daily., Disp: 90 tablet, Rfl: 1   famotidine  (PEPCID ) 20 MG tablet, Take 1 tablet (20 mg total) by mouth 2 (two) times daily., Disp: 60 tablet, Rfl: 6   fluticasone  (FLONASE ) 50 MCG/ACT nasal spray, Place 2 sprays into both nostrils daily., Disp: 18.2 mL, Rfl: 0   indapamide  (LOZOL ) 1.25 MG tablet, TAKE 1 TABLET(1.25 MG) BY MOUTH DAILY. NEEDS APPOINTMENT FOR REFILLS, Disp: 30 tablet, Rfl: 1   predniSONE  (DELTASONE ) 20 MG tablet, Take 2 tablets (40 mg total) by mouth daily with breakfast for 5 days., Disp: 10 tablet, Rfl: 0   promethazine -dextromethorphan (PROMETHAZINE -DM) 6.25-15 MG/5ML syrup, Take 5 mLs by mouth 4 (four) times daily as needed for cough., Disp: 118 mL, Rfl: 0    rosuvastatin  (CRESTOR ) 10 MG tablet, TAKE 1 TABLET(10 MG) BY MOUTH DAILY, Disp: 90 tablet, Rfl: 0  "

## 2024-06-13 ENCOUNTER — Other Ambulatory Visit: Payer: Self-pay | Admitting: Internal Medicine

## 2024-06-13 DIAGNOSIS — E785 Hyperlipidemia, unspecified: Secondary | ICD-10-CM

## 2024-06-15 ENCOUNTER — Inpatient Hospital Stay

## 2024-06-29 ENCOUNTER — Inpatient Hospital Stay: Admitting: Hematology

## 2024-07-10 ENCOUNTER — Inpatient Hospital Stay

## 2024-07-17 ENCOUNTER — Inpatient Hospital Stay: Admitting: Hematology

## 2024-09-12 ENCOUNTER — Encounter: Admitting: Internal Medicine
